# Patient Record
Sex: Male | Born: 1950
Health system: Southern US, Community
[De-identification: ages and names within clinical notes are randomized; demographics above are authoritative.]

## PROBLEM LIST (undated history)

## (undated) DIAGNOSIS — M1990 Unspecified osteoarthritis, unspecified site: Secondary | ICD-10-CM

## (undated) DIAGNOSIS — K219 Gastro-esophageal reflux disease without esophagitis: Secondary | ICD-10-CM

## (undated) DIAGNOSIS — I10 Essential (primary) hypertension: Secondary | ICD-10-CM

## (undated) DIAGNOSIS — E119 Type 2 diabetes mellitus without complications: Secondary | ICD-10-CM

## (undated) DIAGNOSIS — M19071 Primary osteoarthritis, right ankle and foot: Secondary | ICD-10-CM

## (undated) DIAGNOSIS — R351 Nocturia: Secondary | ICD-10-CM

## (undated) DIAGNOSIS — J189 Pneumonia, unspecified organism: Secondary | ICD-10-CM

## (undated) DIAGNOSIS — M2550 Pain in unspecified joint: Secondary | ICD-10-CM

## (undated) DIAGNOSIS — Z860100 Personal history of colon polyps, unspecified: Secondary | ICD-10-CM

## (undated) DIAGNOSIS — M19072 Primary osteoarthritis, left ankle and foot: Secondary | ICD-10-CM

## (undated) DIAGNOSIS — G2581 Restless legs syndrome: Secondary | ICD-10-CM

## (undated) DIAGNOSIS — M2540 Effusion, unspecified joint: Secondary | ICD-10-CM

## (undated) DIAGNOSIS — Z85828 Personal history of other malignant neoplasm of skin: Secondary | ICD-10-CM

## (undated) HISTORY — PX: HYDROCELE EXCISION / REPAIR: SUR1145

## (undated) HISTORY — PX: TONSILLECTOMY: SUR1361

## (undated) HISTORY — PX: COLONOSCOPY: SHX174

## (undated) HISTORY — PX: ESOPHAGOGASTRODUODENOSCOPY: SHX1529

---
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
## 1971-11-13 HISTORY — PX: HAND SURGERY: SHX662

## 1999-03-14 ENCOUNTER — Ambulatory Visit (HOSPITAL_BASED_OUTPATIENT_CLINIC_OR_DEPARTMENT_OTHER): Admission: RE | Admit: 1999-03-14 | Discharge: 1999-03-14 | Payer: Self-pay | Admitting: Orthopaedic Surgery

## 2009-06-17 ENCOUNTER — Encounter: Admission: RE | Admit: 2009-06-17 | Discharge: 2009-06-17 | Payer: Self-pay | Admitting: Neurosurgery

## 2010-12-04 ENCOUNTER — Encounter: Payer: Self-pay | Admitting: Neurosurgery

## 2011-11-13 HISTORY — PX: ROTATOR CUFF REPAIR: SHX139

## 2013-02-16 DIAGNOSIS — R0689 Other abnormalities of breathing: Secondary | ICD-10-CM

## 2013-02-16 DIAGNOSIS — R0600 Dyspnea, unspecified: Secondary | ICD-10-CM | POA: Insufficient documentation

## 2013-10-01 ENCOUNTER — Ambulatory Visit: Payer: Self-pay

## 2013-12-14 DIAGNOSIS — G2581 Restless legs syndrome: Secondary | ICD-10-CM | POA: Insufficient documentation

## 2015-07-26 ENCOUNTER — Ambulatory Visit: Payer: Self-pay | Admitting: Orthopedic Surgery

## 2015-08-02 NOTE — Patient Instructions (Addendum)
YOUR PROCEDURE IS SCHEDULED ON : 08/19/15  REPORT TO [facility] MAIN ENTRANCE FOLLOW SIGNS TO EAST ELEVATOR - GO TO 3rd FLOOR CHECK IN AT 3 EAST NURSES STATION (SHORT STAY) AT:  8:00 AM  CALL THIS NUMBER IF YOU HAVE PROBLEMS THE MORNING OF SURGERY [phone]  REMEMBER:ONLY 1 PER PERSON MAY GO TO SHORT STAY WITH YOU TO GET READY THE MORNING OF YOUR SURGERY  DO NOT EAT FOOD OR DRINK LIQUIDS AFTER MIDNIGHT  TAKE THESE MEDICINES THE MORNING OF SURGERY:  AMLODIPINE / GABAPENTIN / PROTONIX  YOU MAY NOT HAVE ANY METAL ON YOUR BODY INCLUDING HAIR PINS AND PIERCING'S. DO NOT WEAR JEWELRY, MAKEUP, LOTIONS, POWDERS OR PERFUMES. DO NOT WEAR NAIL POLISH. DO NOT SHAVE 48 HRS PRIOR TO SURGERY. MEN MAY SHAVE FACE AND NECK.  DO NOT [place]. [facility] IS NOT RESPONSIBLE FOR VALUABLES.  CONTACTS, DENTURES OR PARTIALS MAY NOT BE WORN TO SURGERY. LEAVE SUITCASE IN CAR. CAN BE BROUGHT TO ROOM AFTER SURGERY.  PATIENTS DISCHARGED THE DAY OF SURGERY WILL NOT BE ALLOWED TO DRIVE HOME.  PLEASE READ OVER THE FOLLOWING INSTRUCTION SHEETS _________________________________________________________________________________                                          [facility] - PREPARING FOR SURGERY  Before surgery, you can play an important role.  Because skin is not sterile, your skin needs to be as free of germs as possible.  You can reduce the number of germs on your skin by washing with CHG (chlorahexidine gluconate) soap before surgery.  CHG is an antiseptic cleaner which kills germs and bonds with the skin to continue killing germs even after washing. Please DO NOT use if you have an allergy to CHG or antibacterial soaps.  If your skin becomes reddened/irritated stop using the CHG and inform your nurse when you arrive at Short Stay. Do not shave (including legs and underarms) for at least 48 hours prior to the first CHG shower.  You may shave your face. Please follow  these instructions carefully:   1.  Shower with CHG Soap the night before surgery and the  morning of Surgery.   2.  If you choose to wash your hair, wash your hair first as usual with your  normal  Shampoo.   3.  After you shampoo, rinse your hair and body thoroughly to remove the  shampoo.                                         4.  Use CHG as you would any other liquid soap.  You can apply chg directly  to the skin and wash . Gently wash with scrungie or clean wascloth    5.  Apply the CHG Soap to your body ONLY FROM THE NECK DOWN.   Do not use on open                           Wound or open sores. Avoid contact with eyes, ears mouth and genitals (private parts).                        Genitals (private parts) with your  normal soap.              6.  Wash thoroughly, paying special attention to the area where your surgery  will be performed.   7.  Thoroughly rinse your body with warm water from the neck down.   8.  DO NOT shower/wash with your normal soap after using and rinsing off  the CHG Soap .                9.  Pat yourself dry with a clean towel.             10.  Wear clean night clothes to bed after shower             11.  Place clean sheets on your bed the night of your first shower and do not  sleep with pets.  Day of Surgery : Do not apply any lotions/deodorants the morning of surgery.  Please wear clean clothes to the hospital/surgery center.  FAILURE TO FOLLOW THESE INSTRUCTIONS MAY RESULT IN THE CANCELLATION OF YOUR SURGERY    PATIENT SIGNATURE_________________________________  ______________________________________________________________________     Samuel Hodge  An incentive spirometer is a tool that can help keep your lungs clear and active. This tool measures how well you are filling your lungs with each breath. Taking long deep breaths may help reverse or decrease the chance of developing breathing (pulmonary) problems (especially infection)  following:  A long period of time when you are unable to move or be active. BEFORE THE PROCEDURE   If the spirometer includes an indicator to show your best effort, your nurse or respiratory therapist will set it to a desired goal.  If possible, sit up straight or lean slightly forward. Try not to slouch.  Hold the incentive spirometer in an upright position. INSTRUCTIONS FOR USE   Sit on the edge of your bed if possible, or sit up as far as you can in bed or on a chair.  Hold the incentive spirometer in an upright position.  Breathe out normally.  Place the mouthpiece in your mouth and seal your lips tightly around it.  Breathe in slowly and as deeply as possible, raising the piston or the ball toward the top of the column.  Hold your breath for 3-5 seconds or for as long as possible. Allow the piston or ball to fall to the bottom of the column.  Remove the mouthpiece from your mouth and breathe out normally.  Rest for a few seconds and repeat Steps 1 through 7 at least 10 times every 1-2 hours when you are awake. Take your time and take a few normal breaths between deep breaths.  The spirometer may include an indicator to show your best effort. Use the indicator as a goal to work toward during each repetition.  After each set of 10 deep breaths, practice coughing to be sure your lungs are clear. If you have an incision (the cut made at the time of surgery), support your incision when coughing by placing a pillow or rolled up towels firmly against it. Once you are able to get out of bed, walk around indoors and cough well. You may stop using the incentive spirometer when instructed by your caregiver.  RISKS AND COMPLICATIONS  Take your time so you do not get dizzy or light-headed.  If you are in pain, you may need to take or ask for pain medication before doing incentive spirometry. It is harder to take a deep  breath if you are having pain. AFTER USE  Rest and breathe slowly  and easily.  It can be helpful to keep track of a log of your progress. Your caregiver can provide you with a simple table to help with this. If you are using the spirometer at home, follow these instructions: [place] IF:   You are having difficultly using the spirometer.  You have trouble using the spirometer as often as instructed.  Your pain medication is not giving enough relief while using the spirometer.  You develop fever of 100.5 F (38.1 C) or higher. SEEK IMMEDIATE MEDICAL CARE IF:   You cough up bloody sputum that had not been present before.  You develop fever of 102 F (38.9 C) or greater.  You develop worsening pain at or near the incision site. MAKE SURE YOU:   Understand these instructions.  Will watch your condition.  Will get help right away if you are not doing well or get worse. Document Released: 03/11/2007 Document Revised: 01/21/2012 Document Reviewed: 05/12/2007 Exit[employer] Patient Information 2014 [employer], [place].   ________________________________________________________________________  WHAT IS A BLOOD TRANSFUSION? Blood Transfusion Information  A transfusion is the replacement of blood or some of its parts. Blood is made up of multiple cells which provide different functions.  Red blood cells carry oxygen and are used for blood loss replacement.  White blood cells fight against infection.  Platelets control bleeding.  Plasma helps clot blood.  Other blood products are available for specialized needs, such as hemophilia or other clotting disorders. BEFORE THE TRANSFUSION  Who gives blood for transfusions?   Healthy volunteers who are fully evaluated to make sure their blood is safe. This is blood bank blood. Transfusion therapy is the safest it has ever been in the practice of medicine. Before blood is taken from a donor, a complete history is taken to make sure that person has no history of diseases nor engages in risky social  behavior (examples are intravenous drug use or sexual activity with multiple partners). The donor's travel history is screened to minimize risk of transmitting infections, such as malaria. The donated blood is tested for signs of infectious diseases, such as HIV and hepatitis. The blood is then tested to be sure it is compatible with you in order to minimize the chance of a transfusion reaction. If you or a relative donates blood, this is often done in anticipation of surgery and is not appropriate for emergency situations. It takes many days to process the donated blood. RISKS AND COMPLICATIONS Although transfusion therapy is very safe and saves many lives, the main dangers of transfusion include:   Getting an infectious disease.  Developing a transfusion reaction. This is an allergic reaction to something in the blood you were given. Every precaution is taken to prevent this. The decision to have a blood transfusion has been considered carefully by your caregiver before blood is given. Blood is not given unless the benefits outweigh the risks. AFTER THE TRANSFUSION  Right after receiving a blood transfusion, you will usually feel much better and more energetic. This is especially true if your red blood cells have gotten low (anemic). The transfusion raises the level of the red blood cells which carry oxygen, and this usually causes an energy increase.  The nurse administering the transfusion will monitor you carefully for complications. HOME CARE INSTRUCTIONS  No special instructions are needed after a transfusion. You may find your energy is better. Speak with your caregiver about any  limitations on activity for underlying diseases you may have. SEEK MEDICAL CARE IF:   Your condition is not improving after your transfusion.  You develop redness or irritation at the intravenous (IV) site. SEEK IMMEDIATE MEDICAL CARE IF:  Any of the following symptoms occur over the next 12 hours:  Shaking  chills.  You have a temperature by mouth above 102 F (38.9 C), not controlled by medicine.  Chest, back, or muscle pain.  People around you feel you are not acting correctly or are confused.  Shortness of breath or difficulty breathing.  Dizziness and fainting.  You get a rash or develop hives.  You have a decrease in urine output.  Your urine turns a dark color or changes to pink, red, or brown. Any of the following symptoms occur over the next 10 days:  You have a temperature by mouth above 102 F (38.9 C), not controlled by medicine.  Shortness of breath.  Weakness after normal activity.  The white part of the eye turns yellow (jaundice).  You have a decrease in the amount of urine or are urinating less often.  Your urine turns a dark color or changes to pink, red, or brown. Document Released: 10/26/2000 Document Revised: 01/21/2012 Document Reviewed: 06/14/2008 [facility] Patient Information 2014 [place], [place].  _______________________________________________________________________

## 2015-08-03 ENCOUNTER — Encounter (HOSPITAL_COMMUNITY)
Admission: RE | Admit: 2015-08-03 | Discharge: 2015-08-03 | Disposition: A | Discharge status: Home / Self Care | Payer: PPO | Source: Ambulatory Visit | Attending: Orthopedic Surgery | Admitting: Orthopedic Surgery

## 2015-08-03 ENCOUNTER — Ambulatory Visit: Payer: Self-pay | Admitting: Orthopedic Surgery

## 2015-08-03 ENCOUNTER — Encounter (HOSPITAL_COMMUNITY): Payer: Self-pay

## 2015-08-03 DIAGNOSIS — Z01818 Encounter for other preprocedural examination: Secondary | ICD-10-CM | POA: Insufficient documentation

## 2015-08-03 HISTORY — DX: Gastro-esophageal reflux disease without esophagitis: K21.9

## 2015-08-03 HISTORY — DX: Unspecified osteoarthritis, unspecified site: M19.90

## 2015-08-03 HISTORY — DX: Primary osteoarthritis, left ankle and foot: M19.072

## 2015-08-03 HISTORY — DX: Personal history of other malignant neoplasm of skin: Z85.828

## 2015-08-03 HISTORY — DX: Primary osteoarthritis, left ankle and foot: M19.071

## 2015-08-03 HISTORY — DX: Type 2 diabetes mellitus without complications: E11.9

## 2015-08-03 HISTORY — DX: Essential (primary) hypertension: I10

## 2015-08-03 LAB — COMPREHENSIVE METABOLIC PANEL
ALT: 37 U/L (ref 17–63)
AST: 33 U/L (ref 15–41)
Albumin: 4.3 g/dL (ref 3.5–5.0)
Alkaline Phosphatase: 78 U/L (ref 38–126)
Anion gap: 8 (ref 5–15)
BUN: 24 mg/dL — ABNORMAL HIGH (ref 6–20)
CO2: 25 mmol/L (ref 22–32)
Calcium: 9.3 mg/dL (ref 8.9–10.3)
Chloride: 101 mmol/L (ref 101–111)
Creatinine, Ser: 0.77 mg/dL (ref 0.61–1.24)
GFR calc Af Amer: >60 mL/min (ref >60)
GFR calc non Af Amer: >60 mL/min (ref >60)
Glucose, Bld: 103 mg/dL — ABNORMAL HIGH (ref 65–99)
Potassium: 3.9 mmol/L (ref 3.5–5.1)
Sodium: 134 mmol/L — ABNORMAL LOW (ref 135–145)
Total Bilirubin: 0.6 mg/dL (ref 0.3–1.2)
Total Protein: 7.2 g/dL (ref 6.5–8.1)

## 2015-08-03 LAB — URINALYSIS, ROUTINE W REFLEX MICROSCOPIC
Bilirubin Urine: NEGATIVE
Glucose, UA: 500 mg/dL — AB
Hgb urine dipstick: NEGATIVE
Ketones, ur: NEGATIVE mg/dL
Leukocytes, UA: NEGATIVE
Nitrite: NEGATIVE
Protein, ur: NEGATIVE mg/dL
Specific Gravity, Urine: 1.027 (ref 1.005–1.030)
Urobilinogen, UA: 0.2 mg/dL (ref 0.0–1.0)
pH: 5 (ref 5.0–8.0)

## 2015-08-03 LAB — CBC
HCT: 42 % (ref 39.0–52.0)
Hemoglobin: 14.8 g/dL (ref 13.0–17.0)
MCH: 30.4 pg (ref 26.0–34.0)
MCHC: 35.2 g/dL (ref 30.0–36.0)
MCV: 86.2 fL (ref 78.0–100.0)
Platelets: 187 10*3/uL (ref 150–400)
RBC: 4.87 MIL/uL (ref 4.22–5.81)
RDW: 12.9 % (ref 11.5–15.5)
WBC: 5.2 10*3/uL (ref 4.0–10.5)

## 2015-08-03 LAB — APTT: aPTT: 26 seconds (ref 24–37)

## 2015-08-03 LAB — PROTIME-INR
INR: 0.9 (ref 0.00–1.49)
Prothrombin Time: 12.3 seconds (ref 11.6–15.2)

## 2015-08-03 LAB — SURGICAL PCR SCREEN
MRSA, PCR: NEGATIVE
Staphylococcus aureus: NEGATIVE

## 2015-08-03 NOTE — H&P (Signed)
TOTAL HIP ADMISSION H&P  Patient is admitted for right total hip arthroplasty.  Subjective:  Chief Complaint: right hip pain  HPI: Samuel Hodge, 64 y.o. male, has a history of pain and functional disability in the right hip(s) due to arthritis and patient has failed non-surgical conservative treatments for greater than 12 weeks to include NSAID's and/or analgesics, corticosteriod injections, use of assistive devices, weight reduction as appropriate and activity modification.  Onset of symptoms was gradual starting 1 years ago with gradually worsening course since that time.The patient noted no past surgery on the right hip(s).  Patient currently rates pain in the right hip at 10 out of 10 with activity. Patient has night pain, worsening of pain with activity and weight bearing, pain that interfers with activities of daily living and pain with passive range of motion. Patient has evidence of subchondral cysts, subchondral sclerosis, periarticular osteophytes and joint space narrowing by imaging studies. This condition presents safety issues increasing the risk of falls. There is no current active infection.  There are no active problems to display for this patient.  Past Medical History  Diagnosis Date  . Hypertension   . Arthritis   . History of gout   . History of skin cancer   . GERD (gastroesophageal reflux disease)   . Diabetes mellitus without complication   . Restless leg syndrome   . Osteoarthritis of both ankles     WEARS BRACES BOTH ANKLES    Past Surgical History  Procedure Laterality Date  . Rotator cuff repair  2013    rt shoulder  . Hand surgery  1973    rt hand  . Hydrocele excision / repair  1986  . Fracture surgery  1968    left hip  . Tonsillectomy       (Not in a hospital admission) Allergies  Allergen Reactions  . Aleve [Naproxen Sodium] Hives  . Aspirin     unknown  . Penicillins     All pt remembers is nose bleeds associated with this reaction.  Has  patient had a PCN reaction causing immediate rash, facial/tongue/throat swelling, SOB or lightheadedness with hypotension: Unknown Has patient had a PCN reaction causing severe rash involving mucus membranes or skin necrosis: Unknown Has patient had a PCN reaction that required hospitalization: Yes Has patient had a PCN reaction occurring within the last 10 years: No If all of the above answers are "NO", then may proceed with Cepha    Social History  Substance Use Topics  . Smoking status: Never Smoker   . Smokeless tobacco: Not on file  . Alcohol Use: No    No family history on file.   Review of Systems  Constitutional: Negative.   HENT: Negative.   Eyes: Negative.   Respiratory: Negative.   Cardiovascular: Negative.   Gastrointestinal: Positive for heartburn.  Genitourinary: Negative.   Musculoskeletal: Positive for joint pain.  Skin: Negative.   Neurological: Negative.   Endo/Heme/Allergies: Negative.   Psychiatric/Behavioral: Negative.     Objective:  Physical Exam  Vitals reviewed. Constitutional: He is oriented to person, place, and time. He appears well-developed and well-nourished.  HENT:  Head: Normocephalic and atraumatic.  Eyes: Conjunctivae and EOM are normal. Pupils are equal, round, and reactive to light.  Neck: Normal range of motion. Neck supple.  Cardiovascular: Normal rate, regular rhythm, normal heart sounds and intact distal pulses.   Respiratory: Breath sounds normal. No respiratory distress.  GI: Soft. He exhibits no distension. There is no tenderness.  Genitourinary:  deferred  Musculoskeletal:       Right hip: He exhibits decreased range of motion and bony tenderness.  Neurological: He is alert and oriented to person, place, and time. He has normal reflexes.  Skin: Skin is warm.  Psychiatric: He has a normal mood and affect. His behavior is normal. Judgment and thought content normal.    Vital signs in last 24  hours: @VSRANGES @  Labs:  HgA1c 7.5   Estimated body mass index is 31.48 kg/(m^2) as calculated from the following:   Height as of an earlier encounter on 08/03/15: 5\' 8"  (1.727 m).   Weight as of an earlier encounter on 08/03/15: 93.895 kg (207 lb).   Imaging Review Plain radiographs demonstrate severe degenerative joint disease of the right hip(s). The bone quality appears to be adequate for age and reported activity level.  Assessment/Plan:  End stage arthritis, right hip(s)  The patient history, physical examination, clinical judgement of the provider and imaging studies are consistent with end stage degenerative joint disease of the right hip(s) and total hip arthroplasty is deemed medically necessary. The treatment options including medical management, injection therapy, arthroscopy and arthroplasty were discussed at length. The risks and benefits of total hip arthroplasty were presented and reviewed. The risks due to aseptic loosening, infection, stiffness, dislocation/subluxation,  thromboembolic complications and other imponderables were discussed.  The patient acknowledged the explanation, agreed to proceed with the plan and consent was signed. Patient is being admitted for inpatient treatment for surgery, pain control, PT, OT, prophylactic antibiotics, VTE prophylaxis, progressive ambulation and ADL's and discharge planning.The patient is planning to be discharged home with home health services

## 2015-08-03 NOTE — Progress Notes (Signed)
   08/03/15 0823  OBSTRUCTIVE SLEEP APNEA  Have you ever been diagnosed with sleep apnea through a sleep study? No  Do you snore loudly (loud enough to be heard through closed doors)?  1  Do you often feel tired, fatigued, or sleepy during the daytime (such as falling asleep during driving or talking to someone)? 0  Has anyone observed you stop breathing during your sleep? 1  Do you have, or are you being treated for high blood pressure? 1  BMI more than 35 kg/m2? 0  Age > 50 (1-yes) 1  Neck circumference greater than:Male 16 inches or larger, Male 17inches or larger? 1  Male Gender (Yes=1) 1  Obstructive Sleep Apnea Score 6

## 2015-08-19 ENCOUNTER — Inpatient Hospital Stay (HOSPITAL_COMMUNITY): Payer: PPO

## 2015-08-19 ENCOUNTER — Inpatient Hospital Stay (HOSPITAL_COMMUNITY)
Admission: RE | Admit: 2015-08-19 | Discharge: 2015-08-20 | DRG: 470 | Disposition: A | Discharge status: Home Health | Payer: PPO | Source: Ambulatory Visit | Attending: Orthopedic Surgery | Admitting: Orthopedic Surgery

## 2015-08-19 ENCOUNTER — Encounter (HOSPITAL_COMMUNITY)
Admission: RE | Disposition: A | Discharge status: Home Health | Payer: Self-pay | Source: Ambulatory Visit | Attending: Orthopedic Surgery

## 2015-08-19 ENCOUNTER — Inpatient Hospital Stay (HOSPITAL_COMMUNITY): Payer: PPO | Admitting: Anesthesiology

## 2015-08-19 ENCOUNTER — Encounter (HOSPITAL_COMMUNITY): Payer: Self-pay | Admitting: *Deleted

## 2015-08-19 DIAGNOSIS — M1611 Unilateral primary osteoarthritis, right hip: Secondary | ICD-10-CM | POA: Diagnosis present

## 2015-08-19 DIAGNOSIS — G2581 Restless legs syndrome: Secondary | ICD-10-CM | POA: Diagnosis present

## 2015-08-19 DIAGNOSIS — Z886 Allergy status to analgesic agent status: Secondary | ICD-10-CM

## 2015-08-19 DIAGNOSIS — M19071 Primary osteoarthritis, right ankle and foot: Secondary | ICD-10-CM | POA: Diagnosis present

## 2015-08-19 DIAGNOSIS — Z7984 Long term (current) use of oral hypoglycemic drugs: Secondary | ICD-10-CM

## 2015-08-19 DIAGNOSIS — Z09 Encounter for follow-up examination after completed treatment for conditions other than malignant neoplasm: Secondary | ICD-10-CM

## 2015-08-19 DIAGNOSIS — Z01812 Encounter for preprocedural laboratory examination: Secondary | ICD-10-CM

## 2015-08-19 DIAGNOSIS — E119 Type 2 diabetes mellitus without complications: Secondary | ICD-10-CM | POA: Diagnosis present

## 2015-08-19 DIAGNOSIS — I10 Essential (primary) hypertension: Secondary | ICD-10-CM | POA: Diagnosis present

## 2015-08-19 DIAGNOSIS — M25551 Pain in right hip: Secondary | ICD-10-CM | POA: Diagnosis present

## 2015-08-19 DIAGNOSIS — Z85828 Personal history of other malignant neoplasm of skin: Secondary | ICD-10-CM

## 2015-08-19 DIAGNOSIS — M19072 Primary osteoarthritis, left ankle and foot: Secondary | ICD-10-CM | POA: Diagnosis present

## 2015-08-19 DIAGNOSIS — K219 Gastro-esophageal reflux disease without esophagitis: Secondary | ICD-10-CM | POA: Diagnosis present

## 2015-08-19 DIAGNOSIS — Z79899 Other long term (current) drug therapy: Secondary | ICD-10-CM | POA: Diagnosis not present

## 2015-08-19 DIAGNOSIS — Z419 Encounter for procedure for purposes other than remedying health state, unspecified: Secondary | ICD-10-CM

## 2015-08-19 HISTORY — PX: TOTAL HIP ARTHROPLASTY: SHX124

## 2015-08-19 LAB — GLUCOSE, CAPILLARY
Glucose-Capillary: 131 mg/dL — ABNORMAL HIGH (ref 65–99)
Glucose-Capillary: 143 mg/dL — ABNORMAL HIGH (ref 65–99)
Glucose-Capillary: 208 mg/dL — ABNORMAL HIGH (ref 65–99)
Glucose-Capillary: 246 mg/dL — ABNORMAL HIGH (ref 65–99)

## 2015-08-19 LAB — ABO/RH: ABO/RH(D): O POS

## 2015-08-19 LAB — TYPE AND SCREEN
ABO/RH(D): O POS
Antibody Screen: NEGATIVE

## 2015-08-19 SURGERY — ARTHROPLASTY, HIP, TOTAL, ANTERIOR APPROACH
Anesthesia: Spinal | Site: Hip | Laterality: Right

## 2015-08-19 MED ORDER — CHLORHEXIDINE GLUCONATE 4 % EX LIQD: 60.0000 mL | Freq: Once | CUTANEOUS | Status: DC

## 2015-08-19 MED ORDER — SODIUM CHLORIDE 0.9 % IJ SOLN
INTRAMUSCULAR | Status: DC | PRN
  Administered 2015-08-19: 30 mL

## 2015-08-19 MED ORDER — CEFAZOLIN SODIUM-DEXTROSE 2-3 GM-% IV SOLR
2.0000 g | Freq: Four times a day (QID) | INTRAVENOUS | Status: AC
  Administered 2015-08-19 (×2): 2 g via INTRAVENOUS
  Filled 2015-08-19 (×2): qty 50

## 2015-08-19 MED ORDER — PANTOPRAZOLE SODIUM 40 MG PO TBEC
40.0000 mg | DELAYED_RELEASE_TABLET | Freq: Every day | ORAL | Status: DC
  Administered 2015-08-20: 40 mg via ORAL
  Filled 2015-08-19: qty 1

## 2015-08-19 MED ORDER — SENNA 8.6 MG PO TABS: 2.0000 | ORAL_TABLET | Freq: Every day | ORAL | Status: DC

## 2015-08-19 MED ORDER — APIXABAN 2.5 MG PO TABS
2.5000 mg | ORAL_TABLET | Freq: Two times a day (BID) | ORAL | Status: DC
Start: 2015-08-20 — End: 2015-08-20
  Administered 2015-08-20: 2.5 mg via ORAL
  Filled 2015-08-19 (×3): qty 1

## 2015-08-19 MED ORDER — PROPOFOL 10 MG/ML IV BOLUS
INTRAVENOUS | Status: AC
  Filled 2015-08-19: qty 20

## 2015-08-19 MED ORDER — ONDANSETRON HCL 4 MG PO TABS: 4.0000 mg | ORAL_TABLET | Freq: Four times a day (QID) | ORAL | Status: DC | PRN

## 2015-08-19 MED ORDER — AMLODIPINE BESYLATE 5 MG PO TABS
5.0000 mg | ORAL_TABLET | Freq: Every day | ORAL | Status: DC
  Administered 2015-08-20: 5 mg via ORAL
  Filled 2015-08-19: qty 1

## 2015-08-19 MED ORDER — METHOCARBAMOL 500 MG PO TABS
500.0000 mg | ORAL_TABLET | Freq: Four times a day (QID) | ORAL | Status: DC | PRN
  Administered 2015-08-20 (×2): 500 mg via ORAL
  Filled 2015-08-19 (×2): qty 1

## 2015-08-19 MED ORDER — MIDAZOLAM HCL 2 MG/2ML IJ SOLN
INTRAMUSCULAR | Status: AC
  Filled 2015-08-19: qty 4

## 2015-08-19 MED ORDER — CHLORHEXIDINE GLUCONATE 4 % EX LIQD
60.0000 mL | Freq: Once | CUTANEOUS | Status: DC
Start: 2015-08-19 — End: 2015-08-19

## 2015-08-19 MED ORDER — INSULIN ASPART 100 UNIT/ML ~~LOC~~ SOLN
0.0000 [IU] | Freq: Three times a day (TID) | SUBCUTANEOUS | Status: DC
  Administered 2015-08-19: 5 [IU] via SUBCUTANEOUS

## 2015-08-19 MED ORDER — SODIUM CHLORIDE 0.9 % IV SOLN: INTRAVENOUS | Status: DC

## 2015-08-19 MED ORDER — DEXTROSE 5 % IV SOLN
500.0000 mg | Freq: Four times a day (QID) | INTRAVENOUS | Status: DC | PRN
  Administered 2015-08-19: 500 mg via INTRAVENOUS
  Filled 2015-08-19 (×3): qty 5

## 2015-08-19 MED ORDER — HYDROGEN PEROXIDE 3 % EX SOLN
CUTANEOUS | Status: DC | PRN
  Administered 2015-08-19: 1

## 2015-08-19 MED ORDER — ACETAMINOPHEN 10 MG/ML IV SOLN
1000.0000 mg | Freq: Once | INTRAVENOUS | Status: AC
  Administered 2015-08-19: 1000 mg via INTRAVENOUS

## 2015-08-19 MED ORDER — FENTANYL CITRATE (PF) 100 MCG/2ML IJ SOLN: 25.0000 ug | INTRAMUSCULAR | Status: DC | PRN

## 2015-08-19 MED ORDER — ONDANSETRON HCL 4 MG/2ML IJ SOLN: 4.0000 mg | Freq: Four times a day (QID) | INTRAMUSCULAR | Status: DC | PRN

## 2015-08-19 MED ORDER — BUPIVACAINE-EPINEPHRINE (PF) 0.25% -1:200000 IJ SOLN
INTRAMUSCULAR | Status: AC
  Filled 2015-08-19: qty 30

## 2015-08-19 MED ORDER — BUPIVACAINE HCL (PF) 0.5 % IJ SOLN
INTRAMUSCULAR | Status: DC | PRN
  Administered 2015-08-19: 3 mL

## 2015-08-19 MED ORDER — LIDOCAINE HCL (CARDIAC) 20 MG/ML IV SOLN
INTRAVENOUS | Status: AC
  Filled 2015-08-19: qty 5

## 2015-08-19 MED ORDER — LISINOPRIL 20 MG PO TABS
30.0000 mg | ORAL_TABLET | Freq: Every day | ORAL | Status: DC
  Administered 2015-08-20: 30 mg via ORAL
  Filled 2015-08-19: qty 1

## 2015-08-19 MED ORDER — METOCLOPRAMIDE HCL 10 MG PO TABS: 5.0000 mg | ORAL_TABLET | Freq: Three times a day (TID) | ORAL | Status: DC | PRN

## 2015-08-19 MED ORDER — DOCUSATE SODIUM 100 MG PO CAPS
100.0000 mg | ORAL_CAPSULE | Freq: Two times a day (BID) | ORAL | Status: DC
  Administered 2015-08-19 – 2015-08-20 (×2): 100 mg via ORAL

## 2015-08-19 MED ORDER — DEXAMETHASONE SODIUM PHOSPHATE 10 MG/ML IJ SOLN
INTRAMUSCULAR | Status: DC | PRN
  Administered 2015-08-19: 10 mg via INTRAVENOUS

## 2015-08-19 MED ORDER — CEFAZOLIN SODIUM-DEXTROSE 2-3 GM-% IV SOLR
2.0000 g | INTRAVENOUS | Status: AC
  Administered 2015-08-19: 2 g via INTRAVENOUS

## 2015-08-19 MED ORDER — BUPIVACAINE HCL (PF) 0.5 % IJ SOLN
INTRAMUSCULAR | Status: AC
  Filled 2015-08-19: qty 30

## 2015-08-19 MED ORDER — MIDAZOLAM HCL 5 MG/5ML IJ SOLN
INTRAMUSCULAR | Status: DC | PRN
  Administered 2015-08-19: 2 mg via INTRAVENOUS

## 2015-08-19 MED ORDER — DEXAMETHASONE SODIUM PHOSPHATE 10 MG/ML IJ SOLN
INTRAMUSCULAR | Status: AC
  Filled 2015-08-19: qty 1

## 2015-08-19 MED ORDER — FENTANYL CITRATE (PF) 100 MCG/2ML IJ SOLN
INTRAMUSCULAR | Status: AC
  Filled 2015-08-19: qty 4

## 2015-08-19 MED ORDER — SODIUM CHLORIDE 0.9 % IV SOLN
INTRAVENOUS | Status: DC
  Administered 2015-08-19: 150 mL/h via INTRAVENOUS

## 2015-08-19 MED ORDER — ACETAMINOPHEN 10 MG/ML IV SOLN
INTRAVENOUS | Status: AC
  Filled 2015-08-19: qty 100

## 2015-08-19 MED ORDER — FENTANYL CITRATE (PF) 100 MCG/2ML IJ SOLN
INTRAMUSCULAR | Status: DC | PRN
Start: 2015-08-19 — End: 2015-08-19
  Administered 2015-08-19: 100 ug via INTRAVENOUS

## 2015-08-19 MED ORDER — GLIMEPIRIDE 1 MG PO TABS
1.0000 mg | ORAL_TABLET | Freq: Every day | ORAL | Status: DC
  Administered 2015-08-20: 1 mg via ORAL
  Filled 2015-08-19 (×2): qty 1

## 2015-08-19 MED ORDER — TRANEXAMIC ACID 1000 MG/10ML IV SOLN: 1000.0000 mg | INTRAVENOUS | Status: DC

## 2015-08-19 MED ORDER — ACETAMINOPHEN 325 MG PO TABS: 650.0000 mg | ORAL_TABLET | Freq: Four times a day (QID) | ORAL | Status: DC | PRN

## 2015-08-19 MED ORDER — BUPIVACAINE-EPINEPHRINE 0.25% -1:200000 IJ SOLN
INTRAMUSCULAR | Status: DC | PRN
  Administered 2015-08-19: 30 mL

## 2015-08-19 MED ORDER — CEFAZOLIN SODIUM-DEXTROSE 2-3 GM-% IV SOLR
INTRAVENOUS | Status: AC
  Filled 2015-08-19: qty 50

## 2015-08-19 MED ORDER — ROPINIROLE HCL ER 8 MG PO TB24
8.0000 mg | ORAL_TABLET | Freq: Every day | ORAL | Status: DC
  Administered 2015-08-19: 8 mg via ORAL
  Filled 2015-08-19 (×2): qty 1

## 2015-08-19 MED ORDER — HYDROCODONE-ACETAMINOPHEN 5-325 MG PO TABS
1.0000 | ORAL_TABLET | ORAL | Status: DC | PRN
  Administered 2015-08-19 – 2015-08-20 (×5): 2 via ORAL
  Filled 2015-08-19 (×5): qty 2

## 2015-08-19 MED ORDER — DEXAMETHASONE SODIUM PHOSPHATE 10 MG/ML IJ SOLN
10.0000 mg | Freq: Once | INTRAMUSCULAR | Status: AC
  Administered 2015-08-20: 10 mg via INTRAVENOUS
  Filled 2015-08-19: qty 1

## 2015-08-19 MED ORDER — HYDROGEN PEROXIDE 3 % EX SOLN
CUTANEOUS | Status: AC
  Filled 2015-08-19: qty 473

## 2015-08-19 MED ORDER — GABAPENTIN 300 MG PO CAPS
900.0000 mg | ORAL_CAPSULE | Freq: Two times a day (BID) | ORAL | Status: DC
  Administered 2015-08-19 – 2015-08-20 (×2): 900 mg via ORAL
  Filled 2015-08-19 (×3): qty 3

## 2015-08-19 MED ORDER — SODIUM CHLORIDE 0.9 % IR SOLN
Status: DC | PRN
  Administered 2015-08-19: 1000 mL
  Administered 2015-08-19: 500 mL
  Administered 2015-08-19: 3000 mL

## 2015-08-19 MED ORDER — PROPOFOL 500 MG/50ML IV EMUL
INTRAVENOUS | Status: DC | PRN
  Administered 2015-08-19: 125 ug/kg/min via INTRAVENOUS

## 2015-08-19 MED ORDER — ISOPROPYL ALCOHOL 70 % SOLN
Status: AC
  Filled 2015-08-19: qty 480

## 2015-08-19 MED ORDER — ISOPROPYL ALCOHOL 70 % SOLN
Status: DC | PRN
  Administered 2015-08-19: 1 via TOPICAL

## 2015-08-19 MED ORDER — ACETAMINOPHEN 650 MG RE SUPP: 650.0000 mg | Freq: Four times a day (QID) | RECTAL | Status: DC | PRN

## 2015-08-19 MED ORDER — MENTHOL 3 MG MT LOZG: 1.0000 | LOZENGE | OROMUCOSAL | Status: DC | PRN

## 2015-08-19 MED ORDER — ONDANSETRON HCL 4 MG/2ML IJ SOLN
INTRAMUSCULAR | Status: AC
  Filled 2015-08-19: qty 2

## 2015-08-19 MED ORDER — METFORMIN HCL ER 500 MG PO TB24
1000.0000 mg | ORAL_TABLET | Freq: Two times a day (BID) | ORAL | Status: DC
  Administered 2015-08-20: 1000 mg via ORAL
  Filled 2015-08-19 (×3): qty 2

## 2015-08-19 MED ORDER — PHENOL 1.4 % MT LIQD: 1.0000 | OROMUCOSAL | Status: DC | PRN

## 2015-08-19 MED ORDER — HYDROMORPHONE HCL 1 MG/ML IJ SOLN: 0.5000 mg | INTRAMUSCULAR | Status: DC | PRN

## 2015-08-19 MED ORDER — EPHEDRINE SULFATE 50 MG/ML IJ SOLN
INTRAMUSCULAR | Status: DC | PRN
  Administered 2015-08-19 (×2): 5 mg via INTRAVENOUS
  Administered 2015-08-19 (×4): 10 mg via INTRAVENOUS

## 2015-08-19 MED ORDER — ONDANSETRON HCL 4 MG/2ML IJ SOLN
INTRAMUSCULAR | Status: DC | PRN
  Administered 2015-08-19: 4 mg via INTRAVENOUS

## 2015-08-19 MED ORDER — SODIUM CHLORIDE 0.9 % IJ SOLN
INTRAMUSCULAR | Status: AC
  Filled 2015-08-19: qty 50

## 2015-08-19 MED ORDER — LIDOCAINE HCL (CARDIAC) 20 MG/ML IV SOLN
INTRAVENOUS | Status: DC | PRN
  Administered 2015-08-19: 50 mg via INTRAVENOUS

## 2015-08-19 MED ORDER — LACTATED RINGERS IV SOLN
INTRAVENOUS | Status: DC
  Administered 2015-08-19: 1000 mL via INTRAVENOUS
  Administered 2015-08-19 (×3): via INTRAVENOUS

## 2015-08-19 MED ORDER — WATER FOR IRRIGATION, STERILE IR SOLN
Status: DC | PRN
  Administered 2015-08-19: 1000 mL

## 2015-08-19 MED ORDER — TRANEXAMIC ACID 1000 MG/10ML IV SOLN
1000.0000 mg | INTRAVENOUS | Status: AC
  Administered 2015-08-19: 1000 mg via INTRAVENOUS
  Filled 2015-08-19: qty 10

## 2015-08-19 MED ORDER — ONDANSETRON HCL 4 MG/2ML IJ SOLN: 4.0000 mg | Freq: Once | INTRAMUSCULAR | Status: DC | PRN

## 2015-08-19 MED ORDER — METOCLOPRAMIDE HCL 5 MG/ML IJ SOLN: 5.0000 mg | Freq: Three times a day (TID) | INTRAMUSCULAR | Status: DC | PRN

## 2015-08-19 MED ORDER — HYDROCHLOROTHIAZIDE 25 MG PO TABS
25.0000 mg | ORAL_TABLET | Freq: Every day | ORAL | Status: DC
  Administered 2015-08-20: 25 mg via ORAL
  Filled 2015-08-19: qty 1

## 2015-08-19 SURGICAL SUPPLY — 49 items
BAG DECANTER FOR FLEXI CONT (MISCELLANEOUS) IMPLANT
BAG SPEC THK2 15X12 ZIP CLS (MISCELLANEOUS)
BAG ZIPLOCK 12X15 (MISCELLANEOUS) IMPLANT
CAPT HIP TOTAL 2 ×1 IMPLANT
CHLORAPREP W/TINT 26ML (MISCELLANEOUS) ×2 IMPLANT
COVER PERINEAL POST (MISCELLANEOUS) ×2 IMPLANT
DECANTER SPIKE VIAL GLASS SM (MISCELLANEOUS) ×2 IMPLANT
DRAPE C-ARM 42X120 X-RAY (DRAPES) ×2 IMPLANT
DRAPE LG THREE QUARTER DISP (DRAPES) ×4 IMPLANT
DRAPE STERI IOBAN 125X83 (DRAPES) ×2 IMPLANT
DRAPE U-SHAPE 47X51 STRL (DRAPES) ×6 IMPLANT
DRSG AQUACEL AG ADV 3.5X10 (GAUZE/BANDAGES/DRESSINGS) ×2 IMPLANT
ELECT BLADE TIP CTD 4 INCH (ELECTRODE) ×2 IMPLANT
ELECT PENCIL ROCKER SW 15FT (MISCELLANEOUS) ×2 IMPLANT
ELECT REM PT RETURN 15FT ADLT (MISCELLANEOUS) ×2 IMPLANT
FACESHIELD WRAPAROUND (MASK) ×4 IMPLANT
FACESHIELD WRAPAROUND OR TEAM (MASK) ×2 IMPLANT
GAUZE SPONGE 4X4 12PLY STRL (GAUZE/BANDAGES/DRESSINGS) ×1 IMPLANT
GLOVE BIO SURGEON STRL SZ8.5 (GLOVE) ×4 IMPLANT
GLOVE BIOGEL PI IND STRL 8.5 (GLOVE) ×1 IMPLANT
GLOVE BIOGEL PI INDICATOR 8.5 (GLOVE) ×1
GOWN SPEC L3 XXLG W/TWL (GOWN DISPOSABLE) ×2 IMPLANT
HANDPIECE INTERPULSE COAX TIP (DISPOSABLE) ×2
HOLDER FOLEY CATH W/STRAP (MISCELLANEOUS) ×2 IMPLANT
HOOD PEEL AWAY FACE SHEILD DIS (HOOD) ×4 IMPLANT
KIT BASIN OR (CUSTOM PROCEDURE TRAY) ×2 IMPLANT
LIQUID BAND (GAUZE/BANDAGES/DRESSINGS) ×3 IMPLANT
NDL SPNL 18GX3.5 QUINCKE PK (NEEDLE) ×1 IMPLANT
NEEDLE SPNL 18GX3.5 QUINCKE PK (NEEDLE) ×2 IMPLANT
PACK TOTAL JOINT (CUSTOM PROCEDURE TRAY) ×2 IMPLANT
PEN SKIN MARKING BROAD (MISCELLANEOUS) ×2 IMPLANT
SAW OSC TIP CART 19.5X105X1.3 (SAW) ×2 IMPLANT
SEALER BIPOLAR AQUA 6.0 (INSTRUMENTS) ×2 IMPLANT
SET HNDPC FAN SPRY TIP SCT (DISPOSABLE) ×1 IMPLANT
SOL PREP POV-IOD 4OZ 10% (MISCELLANEOUS) ×2 IMPLANT
SUT ETHIBOND NAB CT1 #1 30IN (SUTURE) ×4 IMPLANT
SUT MNCRL AB 3-0 PS2 18 (SUTURE) ×2 IMPLANT
SUT MON AB 2-0 CT1 36 (SUTURE) ×4 IMPLANT
SUT VIC AB 1 CT1 36 (SUTURE) ×2 IMPLANT
SUT VIC AB 2-0 CT1 27 (SUTURE) ×2
SUT VIC AB 2-0 CT1 TAPERPNT 27 (SUTURE) ×1 IMPLANT
SUT VLOC 180 0 24IN GS25 (SUTURE) ×2 IMPLANT
SYR 50ML LL SCALE MARK (SYRINGE) ×2 IMPLANT
TOWEL OR 17X26 10 PK STRL BLUE (TOWEL DISPOSABLE) ×2 IMPLANT
TOWEL OR NON WOVEN STRL DISP B (DISPOSABLE) ×2 IMPLANT
TRAY FOLEY W/METER SILVER 14FR (SET/KITS/TRAYS/PACK) ×1 IMPLANT
TRAY FOLEY W/METER SILVER 16FR (SET/KITS/TRAYS/PACK) ×2 IMPLANT
WATER STERILE IRR 1500ML POUR (IV SOLUTION) ×2 IMPLANT
YANKAUER SUCT BULB TIP 10FT TU (MISCELLANEOUS) ×2 IMPLANT

## 2015-08-19 NOTE — Anesthesia Postprocedure Evaluation (Signed)
  Anesthesia Post-op Note  Patient: Samuel Hodge  Procedure(s) Performed: Procedure(s) (LRB): RIGHT TOTAL HIP ARTHROPLASTY ANTERIOR APPROACH (Right)  Patient Location: PACU  Anesthesia Type: Spinal  Level of Consciousness: awake and alert   Airway and Oxygen Therapy: Patient Spontanous Breathing  Post-op Pain: mild  Post-op Assessment: Post-op Vital signs reviewed, Patient's Cardiovascular Status Stable, Respiratory Function Stable, Patent Airway and No signs of Nausea or vomiting  Last Vitals:  Filed Vitals:   08/19/15 1518  BP: 120/69  Pulse: 83  Temp: 36.4 C  Resp: 16    Post-op Vital Signs: stable   Complications: No apparent anesthesia complications

## 2015-08-19 NOTE — H&P (View-Only) (Signed)
TOTAL HIP ADMISSION H&P  Patient is admitted for right total hip arthroplasty.  Subjective:  Chief Complaint: right hip pain  HPI: Samuel Hodge, 64 y.o. male, has a history of pain and functional disability in the right hip(s) due to arthritis and patient has failed non-surgical conservative treatments for greater than 12 weeks to include NSAID's and/or analgesics, corticosteriod injections, use of assistive devices, weight reduction as appropriate and activity modification.  Onset of symptoms was gradual starting 1 years ago with gradually worsening course since that time.The patient noted no past surgery on the right hip(s).  Patient currently rates pain in the right hip at 10 out of 10 with activity. Patient has night pain, worsening of pain with activity and weight bearing, pain that interfers with activities of daily living and pain with passive range of motion. Patient has evidence of subchondral cysts, subchondral sclerosis, periarticular osteophytes and joint space narrowing by imaging studies. This condition presents safety issues increasing the risk of falls. There is no current active infection.  There are no active problems to display for this patient.  Past Medical History  Diagnosis Date  . Hypertension   . Arthritis   . History of gout   . History of skin cancer   . GERD (gastroesophageal reflux disease)   . Diabetes mellitus without complication   . Restless leg syndrome   . Osteoarthritis of both ankles     WEARS BRACES BOTH ANKLES    Past Surgical History  Procedure Laterality Date  . Rotator cuff repair  2013    rt shoulder  . Hand surgery  1973    rt hand  . Hydrocele excision / repair  1986  . Fracture surgery  1968    left hip  . Tonsillectomy       (Not in a hospital admission) Allergies  Allergen Reactions  . Aleve [Naproxen Sodium] Hives  . Aspirin     unknown  . Penicillins     All pt remembers is nose bleeds associated with this reaction.  Has  patient had a PCN reaction causing immediate rash, facial/tongue/throat swelling, SOB or lightheadedness with hypotension: Unknown Has patient had a PCN reaction causing severe rash involving mucus membranes or skin necrosis: Unknown Has patient had a PCN reaction that required hospitalization: Yes Has patient had a PCN reaction occurring within the last 10 years: No If all of the above answers are "NO", then may proceed with Cepha    Social History  Substance Use Topics  . Smoking status: Never Smoker   . Smokeless tobacco: Not on file  . Alcohol Use: No    No family history on file.   Review of Systems  Constitutional: Negative.   HENT: Negative.   Eyes: Negative.   Respiratory: Negative.   Cardiovascular: Negative.   Gastrointestinal: Positive for heartburn.  Genitourinary: Negative.   Musculoskeletal: Positive for joint pain.  Skin: Negative.   Neurological: Negative.   Endo/Heme/Allergies: Negative.   Psychiatric/Behavioral: Negative.     Objective:  Physical Exam  Vitals reviewed. Constitutional: He is oriented to person, place, and time. He appears well-developed and well-nourished.  HENT:  Head: Normocephalic and atraumatic.  Eyes: Conjunctivae and EOM are normal. Pupils are equal, round, and reactive to light.  Neck: Normal range of motion. Neck supple.  Cardiovascular: Normal rate, regular rhythm, normal heart sounds and intact distal pulses.   Respiratory: Breath sounds normal. No respiratory distress.  GI: Soft. He exhibits no distension. There is no tenderness.  Genitourinary:  deferred  Musculoskeletal:       Right hip: He exhibits decreased range of motion and bony tenderness.  Neurological: He is alert and oriented to person, place, and time. He has normal reflexes.  Skin: Skin is warm.  Psychiatric: He has a normal mood and affect. His behavior is normal. Judgment and thought content normal.    Vital signs in last 24  hours: @VSRANGES @  Labs:  HgA1c 7.5   Estimated body mass index is 31.48 kg/(m^2) as calculated from the following:   Height as of an earlier encounter on 08/03/15: 5\' 8"  (1.727 m).   Weight as of an earlier encounter on 08/03/15: 93.895 kg (207 lb).   Imaging Review Plain radiographs demonstrate severe degenerative joint disease of the right hip(s). The bone quality appears to be adequate for age and reported activity level.  Assessment/Plan:  End stage arthritis, right hip(s)  The patient history, physical examination, clinical judgement of the provider and imaging studies are consistent with end stage degenerative joint disease of the right hip(s) and total hip arthroplasty is deemed medically necessary. The treatment options including medical management, injection therapy, arthroscopy and arthroplasty were discussed at length. The risks and benefits of total hip arthroplasty were presented and reviewed. The risks due to aseptic loosening, infection, stiffness, dislocation/subluxation,  thromboembolic complications and other imponderables were discussed.  The patient acknowledged the explanation, agreed to proceed with the plan and consent was signed. Patient is being admitted for inpatient treatment for surgery, pain control, PT, OT, prophylactic antibiotics, VTE prophylaxis, progressive ambulation and ADL's and discharge planning.The patient is planning to be discharged home with home health services

## 2015-08-19 NOTE — Op Note (Signed)
OPERATIVE REPORT  SURGEON: Rod Can, MD   ASSISTANT: Roberto Scales, RNFA.  PREOPERATIVE DIAGNOSIS: Right hip arthritis.   POSTOPERATIVE DIAGNOSIS: Right hip arthritis.   PROCEDURE: Right total hip arthroplasty, anterior approach.   IMPLANTS: DePuy Tri Lock stem, size 5, hi offset. DePuy Pinnacle Cup, size 52 mm. DePuy Altrx liner, size 52 by 32 mm, +4 neutral. DePuy Biolox ceramic head ball, size 32 + 1 mm.  ANESTHESIA:  Spinal  ESTIMATED BLOOD LOSS: 400 mL.   ANTIBIOTICS: 2g ancef.  DRAINS: None.  COMPLICATIONS: None.   CONDITION: PACU - hemodynamically stable.Marland Kitchen   BRIEF CLINICAL NOTE: Samuel Hodge is a 64 y.o. male with a long-standing history of Right hip arthritis. After failing conservative management, the patient was indicated for total hip arthroplasty. The risks, benefits, and alternatives to the procedure were explained, and the patient elected to proceed.  PROCEDURE IN DETAIL: Surgical site was marked by myself. Spinal anesthesia was obtained in the pre-op holding area. Once inside the operative room, a foley catheter was inserted. The patient was then positioned on the Hana table. All bony prominences were well padded. The hip was prepped and draped in the normal sterile surgical fashion. A time-out was called verifying side and site of surgery. The patient received IV antibiotics within 60 minutes of beginning the procedure.  The direct anterior approach to the hip was performed through the Hueter interval. Lateral femoral circumflex vessels were treated with the Auqumantys. The anterior capsule was exposed and an inverted T capsulotomy was made.The femoral neck cut was made to the level of the templated cut. A corkscrew was placed into the head and the head was removed. The femoral head was found to have eburnated bone. The head was passed to the back table and was measured.  Acetabular exposure was achieved, and the pulvinar and labrum  were excised. Sequental reaming of the acetabulum was then performed up to a size 51 mm reamer. A 52 mm cup was then opened and impacted into place at approximately 40 degrees of abduction and 20 degrees of anteversion. The final polyethylene liner was impacted into place and acetabular osteophytes were removed.   I then gained femoral exposure taking care to protect the abductors and greater trochanter. This was performed using standard external rotation, extension, and adduction. The capsule was peeled off the inner aspect of the greater trochanter, taking care to preserve the short external rotators. A cookie cutter was used to enter the femoral canal, and then the femoral canal finder was placed. Sequential broaching was performed up to a size 5. Calcar planer was used on the femoral neck remnant. I paced a hi offset neck and a trial head ball. The hip was reduced. Leg lengths and offset were checked fluoroscopically. The hip was dislocated and trial components were removed. The final implants were placed, and the hip was reduced.  Fluoroscopy was used to confirm component position and leg lengths. At 90 degrees of external rotation and full extension, the hip was stable to an anterior directed force.  The wound was copiously irrigated with a dilute betadine solution followed by normal saline. Marcaine solution was injected into the periarticular soft tissue. The wound was closed in layers using #1 Vicryl and V-Loc for the fascia, 2-0 Vicryl for the subcutaneous fat, 2-0 Monocryl for the deep dermal layer, 3-0 running Monocryl subcuticular stitch, and Dermabond for the skin. Once the glue was fully dried, an Aquacell Ag dressing was applied. The patient was transported to the  recovery room in stable condition. Sponge, needle, and instrument counts were correct at the end of the case x2. The patient tolerated the procedure well and there were no known complications.

## 2015-08-19 NOTE — Anesthesia Preprocedure Evaluation (Addendum)
Anesthesia Evaluation  Patient identified by MRN, date of birth, ID band Patient awake    Reviewed: Allergy & Precautions, NPO status , Patient's Chart, lab work & pertinent test results  History of Anesthesia Complications Negative for: history of anesthetic complications  Airway Mallampati: III  TM Distance: >3 FB Neck ROM: Full  Mouth opening: Limited Mouth Opening  Dental no notable dental hx. (+) Dental Advisory Given   Pulmonary neg pulmonary ROS,    Pulmonary exam normal breath sounds clear to auscultation       Cardiovascular hypertension, Pt. on medications Normal cardiovascular exam Rhythm:Regular Rate:Normal     Neuro/Psych negative neurological ROS  negative psych ROS   GI/Hepatic negative GI ROS, Neg liver ROS, GERD  Medicated and Controlled,  Endo/Other  diabetes, Type 2, Oral Hypoglycemic Agents  Renal/GU negative Renal ROS  negative genitourinary   Musculoskeletal  (+) Arthritis ,   Abdominal   Peds negative pediatric ROS (+)  Hematology negative hematology ROS (+)   Anesthesia Other Findings   Reproductive/Obstetrics negative OB ROS                            Anesthesia Physical Anesthesia Plan  ASA: II  Anesthesia Plan: Spinal   Post-op Pain Management:    Induction: Intravenous  Airway Management Planned: Nasal Cannula  Additional Equipment:   Intra-op Plan:   Post-operative Plan:   Informed Consent: I have reviewed the patients History and Physical, chart, labs and discussed the procedure including the risks, benefits and alternatives for the proposed anesthesia with the patient or authorized representative who has indicated his/her understanding and acceptance.   Dental advisory given  Plan Discussed with:   Anesthesia Plan Comments:        Anesthesia Quick Evaluation

## 2015-08-19 NOTE — Transfer of Care (Signed)
Immediate Anesthesia Transfer of Care Note  Patient: Samuel Hodge  Procedure(s) Performed: Procedure(s): RIGHT TOTAL HIP ARTHROPLASTY ANTERIOR APPROACH (Right)  Patient Location: PACU  Anesthesia Type:Spinal  Level of Consciousness: awake, alert  and oriented  Airway & Oxygen Therapy: Patient Spontanous Breathing and Patient connected to face mask oxygen  Post-op Assessment: Report given to RN and Post -op Vital signs reviewed and stable  Post vital signs: Reviewed and stable  Last Vitals:  Filed Vitals:   08/19/15 1406  BP: 99/52  Pulse:   Temp:   Resp:     Complications: No apparent anesthesia complications

## 2015-08-19 NOTE — Anesthesia Procedure Notes (Signed)
Spinal Patient location during procedure: OR Staffing Anesthesiologist: Lauretta Grill Resident/CRNA: Noralyn Pick D Performed by: anesthesiologist and resident/CRNA  Preanesthetic Checklist Completed: patient identified, site marked, surgical consent, pre-op evaluation, timeout performed, IV checked, risks and benefits discussed and monitors and equipment checked Spinal Block Patient position: sitting Prep: Betadine Patient monitoring: heart rate, continuous pulse ox and blood pressure Approach: right paramedian Location: L3-4 Injection technique: single-shot Needle Needle type: Spinocan  Needle gauge: 22 G Needle length: 9 cm Additional Notes Expiration date of kit checked and confirmed. Patient tolerated procedure well, without complications.  First several attempts by CRNA Peggy and os encountered with no CSF return. Dr. Jillyn Hidden made several passes as well and CSF return with R paramedian approach. No discomfort by patient, paresthesias, and clear CSF return achieved.

## 2015-08-19 NOTE — Interval H&P Note (Signed)
History and Physical Interval Note:  08/19/2015 9:42 AM  Samuel Hodge  has presented today for surgery, with the diagnosis of RIGHT HIP OA   The various methods of treatment have been discussed with the patient and family. After consideration of risks, benefits and other options for treatment, the patient has consented to  Procedure(s): RIGHT TOTAL HIP ARTHROPLASTY ANTERIOR APPROACH (Right) as a surgical intervention .  The patient's history has been reviewed, patient examined, no change in status, stable for surgery.  I have reviewed the patient's chart and labs.  Questions were answered to the patient's satisfaction.     Shaley Leavens, Horald Pollen

## 2015-08-19 NOTE — Evaluation (Signed)
Physical Therapy Evaluation Patient Details Name: Samuel Hodge MRN: [id number] DOB: [birth year] Today's Date: 08/19/2015   History of Present Illness  R DATHA  Clinical Impression  Patient tolerated ambulation very well today, x 75'. Patient will benefit from PT to address problems listed in the note below.    Follow Up Recommendations Home health PT    Equipment Recommendations  Rolling walker with 5" wheels;3in1 (PT)    Recommendations for Other Services       Precautions / Restrictions Precautions Precautions: Fall Required Braces or Orthoses: Other Brace/Splint Other Brace/Splint: bilateral ankle braces Restrictions Weight Bearing Restrictions: No      Mobility  Bed Mobility Overal bed mobility: Needs Assistance Bed Mobility: Supine to Sit     Supine to sit: HOB elevated;Supervision     General bed mobility comments: cues for technique  Transfers Overall transfer level: Needs assistance Equipment used: Rolling walker (2 wheeled) Transfers: Sit to/from Stand Sit to Stand: Min assist         General transfer comment: cues for haND PLACEMENT  Ambulation/Gait Ambulation/Gait assistance: Min assist Ambulation Distance (Feet): 75 Feet Assistive device: Rolling walker (2 wheeled) Gait Pattern/deviations: Step-to pattern     General Gait Details: cues for sequence  Stairs            Wheelchair Mobility    Modified Rankin (Stroke Patients Only)       Balance                                             Pertinent Vitals/Pain Pain Assessment: 0-10 Pain Score: 2  Pain Location: R thigh Pain Descriptors / Indicators: Tender;Tightness Pain Intervention(s): Repositioned;Premedicated before session    Home Living Family/patient expects to be discharged to:: Private residence Living Arrangements: Spouse/significant other Available Help at Discharge: Family Type of Home: House Home Access: Stairs to enter Entrance  Stairs-Rails: Psychiatric nurse of Steps: 3 Home Layout: One level Home Equipment: None      Prior Function Level of Independence: Independent               Hand Dominance        Extremity/Trunk Assessment   Upper Extremity Assessment: Defer to OT evaluation           Lower Extremity Assessment: RLE deficits/detail RLE Deficits / Details: advances the leg well    Cervical / Trunk Assessment: Normal  Communication   Communication: No difficulties  Cognition Arousal/Alertness: Awake/alert Behavior During Therapy: WFL for tasks assessed/performed Overall Cognitive Status: Within Functional Limits for tasks assessed                      General Comments      Exercises        Assessment/Plan    PT Assessment Patient needs continued PT services  PT Diagnosis Difficulty walking   PT Problem List Decreased strength;Decreased range of motion;Decreased activity tolerance;Decreased mobility;Decreased knowledge of use of DME;Decreased safety awareness;Decreased knowledge of precautions;Pain  PT Treatment Interventions DME instruction;Gait training;Stair training;Functional mobility training;Therapeutic activities;Therapeutic exercise;Patient/family education   PT Goals (Current goals can be found in the Care Plan section) Acute Rehab PT Goals Patient Stated Goal: to go home tomorrow PT Goal Formulation: With patient/family Time For Goal Achievement: 08/21/15 Potential to Achieve Goals: Good    Frequency 7X/week   Barriers to discharge  Co-evaluation               End of Session   Activity Tolerance: Patient tolerated treatment well Patient left: in chair;with call bell/phone within reach Nurse Communication: Mobility status         Time: 1505-6979 PT Time Calculation (min) (ACUTE ONLY): 29 min   Charges:   PT Evaluation $Initial PT Evaluation Tier I: 1 Procedure PT Treatments $Gait Training: 8-22 mins   PT  G Codes:        Claretha Cooper 08/19/2015, 5:48 PM

## 2015-08-20 LAB — BASIC METABOLIC PANEL
Anion gap: 8 (ref 5–15)
BUN: 13 mg/dL (ref 6–20)
CO2: 26 mmol/L (ref 22–32)
Calcium: 8.4 mg/dL — ABNORMAL LOW (ref 8.9–10.3)
Chloride: 102 mmol/L (ref 101–111)
Creatinine, Ser: 0.72 mg/dL (ref 0.61–1.24)
GFR calc Af Amer: >60 mL/min (ref >60)
GFR calc non Af Amer: >60 mL/min (ref >60)
Glucose, Bld: 170 mg/dL — ABNORMAL HIGH (ref 65–99)
Potassium: 4.3 mmol/L (ref 3.5–5.1)
Sodium: 136 mmol/L (ref 135–145)

## 2015-08-20 LAB — CBC
HCT: 34.5 % — ABNORMAL LOW (ref 39.0–52.0)
Hemoglobin: 12.5 g/dL — ABNORMAL LOW (ref 13.0–17.0)
MCH: 30.9 pg (ref 26.0–34.0)
MCHC: 36.2 g/dL — ABNORMAL HIGH (ref 30.0–36.0)
MCV: 85.2 fL (ref 78.0–100.0)
Platelets: 172 10*3/uL (ref 150–400)
RBC: 4.05 MIL/uL — ABNORMAL LOW (ref 4.22–5.81)
RDW: 12.7 % (ref 11.5–15.5)
WBC: 11 10*3/uL — ABNORMAL HIGH (ref 4.0–10.5)

## 2015-08-20 LAB — GLUCOSE, CAPILLARY: Glucose-Capillary: 140 mg/dL — ABNORMAL HIGH (ref 65–99)

## 2015-08-20 MED ORDER — APIXABAN 2.5 MG PO TABS: 2.5000 mg | ORAL_TABLET | Freq: Two times a day (BID) | ORAL | Status: DC

## 2015-08-20 MED ORDER — DOCUSATE SODIUM 100 MG PO CAPS: 100.0000 mg | ORAL_CAPSULE | Freq: Two times a day (BID) | ORAL | Status: DC

## 2015-08-20 MED ORDER — ONDANSETRON HCL 4 MG PO TABS: 4.0000 mg | ORAL_TABLET | Freq: Four times a day (QID) | ORAL | Status: DC | PRN

## 2015-08-20 MED ORDER — HYDROCODONE-ACETAMINOPHEN 5-325 MG PO TABS: 1.0000 | ORAL_TABLET | ORAL | Status: DC | PRN

## 2015-08-20 MED ORDER — SENNA 8.6 MG PO TABS: 2.0000 | ORAL_TABLET | Freq: Every day | ORAL | Status: DC

## 2015-08-20 NOTE — Progress Notes (Signed)
Physical Therapy Treatment Patient Details Name: Samuel Hodge MRN: [id number] DOB: 05/25/1951 Today's Date: 08/20/2015    History of Present Illness R DATHA    PT Comments    Patient is progressing well. Ready for DC.  Follow Up Recommendations  Home health PT     Equipment Recommendations  Rolling walker with 5" wheels;3in1 (PT)    Recommendations for Other Services       Precautions / Restrictions Precautions Precautions: Fall Required Braces or Orthoses: Other Brace/Splint Other Brace/Splint: bilateral ankle braces Restrictions Weight Bearing Restrictions: Yes Other Position/Activity Restrictions: WBAT    Mobility  Bed Mobility               General bed mobility comments: Pt found seated EOB upon OT entering/exiting room  Transfers Overall transfer level: Needs assistance Equipment used: Rolling walker (2 wheeled) Transfers: Sit to/from Stand Sit to Stand: Supervision         General transfer comment: Min guard for safety, cues for hand placement and technique.   Ambulation/Gait Ambulation/Gait assistance: Supervision Ambulation Distance (Feet): 300 Feet Assistive device: Rolling walker (2 wheeled) Gait Pattern/deviations: Step-to pattern     General Gait Details: cues for sequence   Stairs Stairs: Yes Stairs assistance: Min assist Stair Management: One rail Right;Step to pattern;Forwards;With cane Number of Stairs: 4 General stair comments: wife present for instruction.  Wheelchair Mobility    Modified Rankin (Stroke Patients Only)       Balance Overall balance assessment: Needs assistance Sitting-balance support: Feet supported;No upper extremity supported Sitting balance-Leahy Scale: Good     Standing balance support: Bilateral upper extremity supported;During functional activity Standing balance-Leahy Scale: Fair                      Cognition Arousal/Alertness: Awake/alert Behavior During Therapy: WFL for tasks  assessed/performed Overall Cognitive Status: Within Functional Limits for tasks assessed                      Exercises Total Joint Exercises Ankle Circles/Pumps: AROM;Right;Left;10 reps Quad Sets: 10 reps Short Arc Quad: AROM;Right;10 reps Heel Slides: AROM;Right;10 reps Hip ABduction/ADduction: AROM;Right;10 reps Long Arc Quad: AROM;Right;10 reps    General Comments        Pertinent Vitals/Pain Pain Assessment: Faces Pain Score: 4  Faces Pain Scale: Hurts little more Pain Location: thigh on right Pain Descriptors / Indicators: Tightness Pain Intervention(s): Premedicated before session;Ice applied    Home Living Family/patient expects to be discharged to:: Private residence Living Arrangements: Spouse/significant other Available Help at Discharge: Family Type of Home: House Home Access: Stairs to enter Entrance Stairs-Rails: Right;Left Home Layout: One level Home Equipment: None      Prior Function Level of Independence: Independent          PT Goals (current goals can now be found in the care plan section) Acute Rehab PT Goals Patient Stated Goal: to go home today Progress towards PT goals: Progressing toward goals    Frequency  7X/week    PT Plan Current plan remains appropriate    Co-evaluation             End of Session   Activity Tolerance: Patient tolerated treatment well Patient left: in chair;with family/visitor present     Time: 3536-1443 PT Time Calculation (min) (ACUTE ONLY): 22 min  Charges:  $Gait Training: 8-22 mins $Therapeutic Exercise: 23-37 mins  G Codes:      Claretha Cooper 08/20/2015, 1:12 PM

## 2015-08-20 NOTE — Discharge Summary (Signed)
Physician Discharge Summary  Patient ID: Samuel Hodge MRN: [id number] DOB/AGE: [birth year] 64 y.o.  Admit date: 08/19/2015 Discharge date: 08/20/2015  Admission Diagnoses:  Primary osteoarthritis of right hip  Discharge Diagnoses:  Principal Problem:   Primary osteoarthritis of right hip   Past Medical History  Diagnosis Date  . Hypertension   . Arthritis   . History of gout   . History of skin cancer   . GERD (gastroesophageal reflux disease)   . Diabetes mellitus without complication ([place])   . Restless leg syndrome   . Osteoarthritis of both ankles     WEARS BRACES BOTH ANKLES    Surgeries: Procedure(s): RIGHT TOTAL HIP ARTHROPLASTY ANTERIOR APPROACH on 08/19/2015   Consultants (if any):    Discharged Condition: Improved  Hospital Course: Samuel Hodge is an 64 y.o. male who was admitted 08/19/2015 with a diagnosis of Primary osteoarthritis of right hip and went to the operating room on 08/19/2015 and underwent the above named procedures.    He was given perioperative antibiotics:      Anti-infectives    Start     Dose/Rate Route Frequency Ordered Stop   08/19/15 1700  ceFAZolin (ANCEF) IVPB 2 g/50 mL premix     2 g 100 mL/hr over 30 Minutes Intravenous Every 6 hours 08/19/15 1528 08/19/15 2246   08/19/15 0752  ceFAZolin (ANCEF) IVPB 2 g/50 mL premix     2 g 100 mL/hr over 30 Minutes Intravenous On call to O.R. 08/19/15 1275 08/19/15 1042    .  He was given sequential compression devices, early ambulation, and apixiban for DVT prophylaxis.  He benefited maximally from the hospital stay and there were no complications.    Recent vital signs:  Filed Vitals:   08/20/15 0444  BP: 127/73  Pulse: 73  Temp: 97 F (36.1 C)  Resp: 16    Recent laboratory studies:  Lab Results  Component Value Date   HGB 12.5* 08/20/2015   HGB 14.8 08/03/2015   Lab Results  Component Value Date   WBC 11.0* 08/20/2015   PLT 172 08/20/2015   Lab Results  Component  Value Date   INR 0.90 08/03/2015   Lab Results  Component Value Date   NA 136 08/20/2015   K 4.3 08/20/2015   CL 102 08/20/2015   CO2 26 08/20/2015   BUN 13 08/20/2015   CREATININE 0.72 08/20/2015   GLUCOSE 170* 08/20/2015    Discharge Medications:     Medication List    STOP taking these medications        acetaminophen 650 MG CR tablet  Commonly known as:  TYLENOL     meloxicam 15 MG tablet  Commonly known as:  MOBIC      TAKE these medications        amLODipine 5 MG tablet  Commonly known as:  NORVASC  Take 5 mg by mouth daily.     apixaban 2.5 MG Tabs tablet  Commonly known as:  ELIQUIS  Take 1 tablet (2.5 mg total) by mouth 2 (two) times daily.     cyclobenzaprine 10 MG tablet  Commonly known as:  FLEXERIL  Take 10 mg by mouth 3 times/day as needed-between meals & bedtime for muscle spasms.     docusate sodium 100 MG capsule  Commonly known as:  COLACE  Take 1 capsule (100 mg total) by mouth 2 (two) times daily.     gabapentin 600 MG tablet  Commonly known as:  NEURONTIN  Take 900 mg by mouth 2 (two) times daily.     glimepiride 1 MG tablet  Commonly known as:  AMARYL  Take 1 mg by mouth daily with breakfast.     hydrochlorothiazide 25 MG tablet  Commonly known as:  HYDRODIURIL  Take 25 mg by mouth daily.     HYDROcodone-acetaminophen 5-325 MG tablet  Commonly known as:  NORCO  Take 1-2 tablets by mouth every 4 (four) hours as needed for moderate pain.     lisinopril 20 MG tablet  Commonly known as:  PRINIVIL,ZESTRIL  Take 30 mg by mouth daily.     metFORMIN 500 MG 24 hr tablet  Commonly known as:  GLUCOPHAGE-XR  Take 1,000 mg by mouth 2 (two) times daily.     ondansetron 4 MG tablet  Commonly known as:  ZOFRAN  Take 1 tablet (4 mg total) by mouth every 6 (six) hours as needed for nausea.     pantoprazole 40 MG tablet  Commonly known as:  PROTONIX  Take 40 mg by mouth daily.     rOPINIRole 8 MG 24 hr tablet  Commonly known as:  REQUIP  XL  Take 8 mg by mouth at bedtime.     senna 8.6 MG Tabs tablet  Commonly known as:  SENOKOT  Take 2 tablets (17.2 mg total) by mouth at bedtime.        Diagnostic Studies: Dg Pelvis Portable  08/19/2015   CLINICAL DATA:  Postop evaluation right hip replacement.  EXAM: PORTABLE PELVIS 1-2 VIEWS  COMPARISON:  None.  FINDINGS: The lower pelvis is included on current radiograph. Patient status post right hip arthroplasty. Hardware appears in appropriate position on single AP portable view. No evidence for displaced fracture. Right pelvic phlebolith.  IMPRESSION: Patient status post right hip arthroplasty.   Electronically Signed   By: Lovey Newcomer M.D.   On: 08/19/2015 14:52   Dg C-arm Gt 120 Min-no Report  08/19/2015   CLINICAL DATA: surgery   C-ARM GT 120 MINUTE  Fluoroscopy was utilized by the requesting physician.  No radiographic  interpretation.    Dg Hip Operative Unilat With Pelvis Right  08/19/2015   CLINICAL DATA:  Status post total hip replacement on the right  EXAM: DG C-ARM GT 120 MIN-NO REPORT; OPERATIVE RIGHT HIP  COMPARISON:  None.  FLUOROSCOPY TIME:  Fluoroscopy Time:  0 minutes, 54 second  Number of Acquired Images:  2  FINDINGS: Frontal right hip image shows a total hip prosthesis with prosthetic components appearing well-seated. No fracture or dislocation apparent.  IMPRESSION: Prosthetic components appear well seated on the right. No fracture or dislocation apparent on single view.   Electronically Signed   By: Lowella Grip III M.D.   On: 08/19/2015 13:50    Disposition: Final discharge disposition not confirmed  Discharge Instructions    Call MD / Call 911    Complete by:  As directed   If you experience chest pain or shortness of breath, CALL 911 and be transported to the hospital emergency room.  If you develope a fever above 101 F, pus (white drainage) or increased drainage or redness at the wound, or calf pain, call your surgeon's office.     Constipation  Prevention    Complete by:  As directed   Drink plenty of fluids.  Prune juice may be helpful.  You may use a stool softener, such as Colace (over the counter) 100 mg twice a day.  Use MiraLax (over the counter)  for constipation as needed.     Diet - low sodium heart healthy    Complete by:  As directed      Driving restrictions    Complete by:  As directed   No driving for 6 weeks     Increase activity slowly as tolerated    Complete by:  As directed      Lifting restrictions    Complete by:  As directed   No lifting for 6 weeks     TED hose    Complete by:  As directed   Use stockings (TED hose) for 2 weeks on both leg(s).  You may remove them at night for sleeping.           Follow-up Information    Follow up with Freddye Cardamone, Horald Pollen, MD. Schedule an appointment as soon as possible for a visit in 2 weeks.   Specialty:  Orthopedic Surgery   Why:  For wound re-check   Contact information:   [address] [address] [phone]        Signed: [place], [place] 08/20/2015, 8:20 AM

## 2015-08-20 NOTE — Progress Notes (Addendum)
   Subjective:  Patient reports pain as mild.  Denies N/V/CP/SOB.  Objective:   VITALS:   Filed Vitals:   08/19/15 1823 08/19/15 2157 08/20/15 0125 08/20/15 0444  BP: 117/71 131/77 127/78 127/73  Pulse: 90 87 72 73  Temp: 97.2 F (36.2 C) 98.8 F (37.1 C) 97.8 F (36.6 C) 97 F (36.1 C)  TempSrc: Rectal  Oral Oral  Resp: 18 18 16 16   Height:      Weight:      SpO2: 100% 97% 98% 100%    Sensation intact distally Intact pulses distally Dorsiflexion/Plantar flexion intact Incision: dressing C/D/I Compartment soft   Lab Results  Component Value Date   WBC 11.0* 08/20/2015   HGB 12.5* 08/20/2015   HCT 34.5* 08/20/2015   MCV 85.2 08/20/2015   PLT 172 08/20/2015   BMET    Component Value Date/Time   NA 136 08/20/2015 0433   K 4.3 08/20/2015 0433   CL 102 08/20/2015 0433   CO2 26 08/20/2015 0433   GLUCOSE 170* 08/20/2015 0433   BUN 13 08/20/2015 0433   CREATININE 0.72 08/20/2015 0433   CALCIUM 8.4* 08/20/2015 0433   GFRNONAA >60 08/20/2015 0433   GFRAA >60 08/20/2015 0433     Assessment/Plan: 1 Day Post-Op   Active Problems:   Primary osteoarthritis of right hip   WBAT with walker apixaban (allergic to NSAIDs), SCDs, TEDs PT/OT PO pain control Discharge home with home health    Laquita Harlan, Mcadoo Muzquiz 08/20/2015, 8:15 AM   Rod Can, MD Cell [phone]

## 2015-08-20 NOTE — Discharge Instructions (Signed)
°Dr. Brian Swinteck °Joint Replacement Specialist °[facility] °[address] °[place],  [address] °[phone] ° ° °TOTAL HIP REPLACEMENT POSTOPERATIVE DIRECTIONS ° ° ° °Hip Rehabilitation, Guidelines Following Surgery  ° °WEIGHT BEARING °Weight bearing as tolerated with assist device (walker, cane, etc) as directed, use it as long as suggested by your surgeon or therapist, typically at least 4-6 weeks. ° °The results of a hip operation are greatly improved after range of motion and muscle strengthening exercises. Follow all safety measures which are given to protect your hip. If any of these exercises cause increased pain or swelling in your joint, decrease the amount until you are comfortable again. Then slowly increase the exercises. Call your caregiver if you have problems or questions.  ° °HOME CARE INSTRUCTIONS  °Most of the following instructions are designed to prevent the dislocation of your new hip.  °Remove items at home which could result in a fall. This includes throw rugs or furniture in walking pathways.  °Continue medications as instructed at time of discharge. °· You may have some home medications which will be placed on hold until you complete the course of blood thinner medication. °· You may start showering once you are discharged home. Do not remove your dressing. °Do not put on socks or shoes without following the instructions of your caregivers.   °Sit on chairs with arms. Use the chair arms to help push yourself up when arising.  °Arrange for the use of a toilet seat elevator so you are not sitting low.  °· Walk with walker as instructed.  °You may resume a sexual relationship in one month or when given the OK by your caregiver.  °Use walker as long as suggested by your caregivers.  °You may put full weight on your legs and walk as much as is comfortable. °Avoid periods of inactivity such as sitting longer than an hour when not asleep. This helps prevent  blood clots.  °You may return to work once you are cleared by your surgeon.  °Do not drive a car for 6 weeks or until released by your surgeon.  °Do not drive while taking narcotics.  °Wear elastic stockings for two weeks following surgery during the day but you may remove then at night.  °Make sure you keep all of your appointments after your operation with all of your doctors and caregivers. You should call the office at the above phone number and make an appointment for approximately two weeks after the date of your surgery. °Please pick up a stool softener and laxative for home use as long as you are requiring pain medications. °· ICE to the affected hip every three hours for 30 minutes at a time and then as needed for pain and swelling. Continue to use ice on the hip for pain and swelling from surgery. You may notice swelling that will progress down to the foot and ankle.  This is normal after surgery.  Elevate the leg when you are not up walking on it.   °It is important for you to complete the blood thinner medication as prescribed by your doctor. °· Continue to use the breathing machine which will help keep your temperature down.  It is common for your temperature to cycle up and down following surgery, especially at night when you are not up moving around and exerting yourself.  The breathing machine keeps your lungs expanded and your temperature down. ° °RANGE OF MOTION AND STRENGTHENING EXERCISES  °These exercises are   designed to help you keep full movement of your hip joint. Follow your caregiver's or physical therapist's instructions. Perform all exercises about fifteen times, three times per day or as directed. Exercise both hips, even if you have had only one joint replacement. These exercises can be done on a training (exercise) mat, on the floor, on a table or on a bed. Use whatever works the best and is most comfortable for you. Use music or television while you are exercising so that the exercises  are a pleasant break in your day. This will make your life better with the exercises acting as a break in routine you can look forward to.  Lying on your back, slowly slide your foot toward your buttocks, raising your knee up off the floor. Then slowly slide your foot back down until your leg is straight again.  Lying on your back spread your legs as far apart as you can without causing discomfort.  Lying on your side, raise your upper leg and foot straight up from the floor as far as is comfortable. Slowly lower the leg and repeat.  Lying on your back, tighten up the muscle in the front of your thigh (quadriceps muscles). You can do this by keeping your leg straight and trying to raise your heel off the floor. This helps strengthen the largest muscle supporting your knee.  Lying on your back, tighten up the muscles of your buttocks both with the legs straight and with the knee bent at a comfortable angle while keeping your heel on the floor.   SKILLED REHAB INSTRUCTIONS: If the patient is transferred to a skilled rehab facility following release from the hospital, a list of the current medications will be sent to the facility for the patient to continue.  When discharged from the skilled rehab facility, please have the facility set up the patient's [place] prior to being released. Also, the skilled facility will be responsible for providing the patient with their medications at time of release from the facility to include their pain medication and their blood thinner medication. If the patient is still at the rehab facility at time of the two week follow up appointment, the skilled rehab facility will also need to assist the patient in arranging follow up appointment in our office and any transportation needs.  MAKE SURE YOU:  Understand these instructions.  Will watch your condition.  Will get help right away if you are not doing well or get worse.  Pick up stool softner and  laxative for home use following surgery while on pain medications. Do not remove your dressing. The dressing is waterproof--it is OK to take showers. Continue to use ice for pain and swelling after surgery. Do not use any lotions or creams on the incision until instructed by your surgeon. Total Hip Protocol.  Information on my medicine - ELIQUIS (apixaban)  This medication education was reviewed with me or my healthcare representative as part of my discharge preparation.  The pharmacist that spoke with me during my hospital stay was:  Emiliano Dyer, [facility]  Why was Eliquis prescribed for you? Eliquis was prescribed for you to reduce the risk of blood clots forming after orthopedic surgery.    What do You need to know about Eliquis? Take your Eliquis TWICE DAILY - one tablet in the morning and one tablet in the evening with or without food.  It would be best to take the dose about the same time each day.  If you have difficulty swallowing the tablet whole please discuss with your pharmacist how to take the medication safely.  Take Eliquis exactly as prescribed by your doctor and DO NOT stop taking Eliquis without talking to the doctor who prescribed the medication.  Stopping without other medication to take the place of Eliquis may increase your risk of developing a clot.  After discharge, you should have regular check-up appointments with your healthcare provider that is prescribing your Eliquis.  What do you do if you miss a dose? If a dose of ELIQUIS is not taken at the scheduled time, take it as soon as possible on the same day and twice-daily administration should be resumed.  The dose should not be doubled to make up for a missed dose.  Do not take more than one tablet of ELIQUIS at the same time.  Important Safety Information A possible side effect of Eliquis is bleeding. You should call your healthcare provider right away if you experience any of the  following: ? Bleeding from an injury or your nose that does not stop. ? Unusual colored urine (red or dark brown) or unusual colored stools (red or black). ? Unusual bruising for unknown reasons. ? A serious fall or if you hit your head (even if there is no bleeding).  Some medicines may interact with Eliquis and might increase your risk of bleeding or clotting while on Eliquis. To help avoid this, consult your healthcare provider or pharmacist prior to using any new prescription or non-prescription medications, including herbals, vitamins, non-steroidal anti-inflammatory drugs (NSAIDs) and supplements.  This website has more information on Eliquis (apixaban): http://www.eliquis.com/eliquis/home

## 2015-08-20 NOTE — Care Management Note (Signed)
Case Management Note  Patient Details  Name: Samuel Hodge MRN: [id number] Date of Birth: 10-23-1951  Subjective/Objective:                  right total hip arthroplasty  Action/Plan: Discharge planning  Expected Discharge Date:    08/20/2015              Expected Discharge Plan:  [place]  In-House Referral:     Discharge planning Services  CM Consult  Post Acute Care Choice:  Home Health Choice offered to:  Patient  DME Arranged:  N/A (Pt wants to get DME from [facility]) DME Agency:  NA  HH Arranged:  PT [place]:  [place] [facility]  Status of Service:  Completed, signed off  Medicare Important Message Given:    Date Medicare IM Given:    Medicare IM give by:    Date Additional Medicare IM Given:    Additional Medicare Important Message give by:     If discussed at [place] of Stay Meetings, dates discussed:    Additional Comments: CM spoke with the patient and his wife at the bedside. They have selected [facility] for [place] [facility]. Referral called to Johnette Abraham at [facility]. Faxed order, face sheet and H&P to [phone]. Patient would like to get his 3N1 and RW from [facility]. Helene Kelp is aware.  Apolonio Schneiders, RN 08/20/2015, 11:18 AM

## 2015-08-20 NOTE — Discharge Planning (Signed)
Spoke to patient about DME and they told me that they are okay

## 2015-08-20 NOTE — Progress Notes (Signed)
Patient discharged home with wife.  Discharge instructions reviewed with patient and spouse.  Patient and spouse verbalized understanding.  Noam Franzen RN

## 2015-08-20 NOTE — Progress Notes (Signed)
Physical Therapy Treatment Patient Details Name: Samuel Hodge MRN: [id number] DOB: 10-Mar-1951 Today's Date: 08/20/2015    History of Present Illness R DATHA    PT Comments    Patient progressing well, plans Dc after practicing steps.  Follow Up Recommendations  Home health PT     Equipment Recommendations  Rolling walker with 5" wheels;3in1 (PT)    Recommendations for Other Services       Precautions / Restrictions Precautions Precautions: Fall Required Braces or Orthoses: Other Brace/Splint Other Brace/Splint: bilateral ankle braces Restrictions Weight Bearing Restrictions: Yes Other Position/Activity Restrictions: WBAT    Cognition Arousal/Alertness: Awake/alert Behavior During Therapy: WFL for tasks assessed/performed Overall Cognitive Status: Within Functional Limits for tasks assessed                      Exercises Total Joint Exercises Ankle Circles/Pumps: AROM;Right;Left;10 reps Quad Sets: 10 reps Short Arc Quad: AROM;Right;10 reps Heel Slides: AROM;Right;10 reps Hip ABduction/ADduction: AROM;Right;10 reps Long Arc Quad: AROM;Right;10 reps    General Comments        Pertinent Vitals/Pain Pain Assessment: Faces Pain Score: 2  Faces Pain Scale: Hurts little more Pain Location: right thigh Pain Descriptors / Indicators: Tightness Pain Intervention(s): Monitored during session;Premedicated before session    Home Living Family/patient expects to be discharged to:: Private residence Living Arrangements: Spouse/significant other Available Help at Discharge: Family Type of Home: House Home Access: Stairs to enter Entrance Stairs-Rails: Right;Left Home Layout: One level Home Equipment: None      Prior Function Level of Independence: Independent          PT Goals (current goals can now be found in the care plan section) Acute Rehab PT Goals Patient Stated Goal: to go home today Progress towards PT goals: Progressing toward goals     Frequency  7X/week    PT Plan Current plan remains appropriate    Co-evaluation             End of Session   Activity Tolerance: Patient tolerated treatment well Patient left: in bed;with family/visitor present     Time: 2119-4174 PT Time Calculation (min) (ACUTE ONLY): 30 min  Charges:  $Therapeutic Exercise: 23-37 mins                    G Codes:      Claretha Cooper 08/20/2015, 1:05 PM

## 2015-08-20 NOTE — Evaluation (Signed)
Occupational Therapy Evaluation Patient Details Name: Samuel Hodge MRN: [id number] DOB: [birth year] Today's Date: 08/20/2015    History of Present Illness R DATHA   Clinical Impression   Patient presenting with decreased ADL and functional mobility independence secondary to above. Patient independent PTA. Patient currently functioning at an overall min assist level. Patient will benefit from acute OT to increase overall independence in the areas of ADLs, functional mobility, and overall safety in order to safely discharge home with wife.     Follow Up Recommendations  No OT follow up;Supervision - Intermittent    Equipment Recommendations  3 in 1 bedside comode;Other (comment) (reahcer and LH sponge)    Recommendations for Other Services  None at this time  Precautions / Restrictions Precautions Precautions: Fall Required Braces or Orthoses: Other Brace/Splint Other Brace/Splint: bilateral ankle braces Restrictions Weight Bearing Restrictions: Yes Other Position/Activity Restrictions: WBAT      Mobility Bed Mobility General bed mobility comments: Pt found seated EOB upon OT entering/exiting room  Transfers Overall transfer level: Needs assistance Equipment used: Rolling walker (2 wheeled) Transfers: Sit to/from Stand Sit to Stand: Min guard General transfer comment: Min guard for safety, cues for hand placement and technique.     Balance Overall balance assessment: Needs assistance Sitting-balance support: Feet supported;No upper extremity supported Sitting balance-Leahy Scale: Good     Standing balance support: Bilateral upper extremity supported;During functional activity Standing balance-Leahy Scale: Fair    ADL Overall ADL's : Needs assistance/impaired Eating/Feeding: Set up;Sitting   Grooming: Set up;Sitting   Upper Body Bathing: Set up;Sitting   Lower Body Bathing: Minimal assistance;Sit to/from stand Lower Body Bathing Details (indicate cue type and  reason): will benefit from [facility] sponge Upper Body Dressing : Set up;Sitting   Lower Body Dressing: Minimal assistance;Sit to/from stand Lower Body Dressing Details (indicate cue type and reason): will benefit from [facility] shoe horn and reacher Toilet Transfer: Min guard;RW;BSC;Ambulation     Toileting - Clothing Manipulation Details (indicate cue type and reason): did not occur   Tub/Shower Transfer Details (indicate cue type and reason): did not occur, did discuss tub/shower transfer (handout given) using BSC Functional mobility during ADLs: Min guard;Rolling walker General ADL Comments: Pt sat EOB for LB dressing. Pt able to cross LLE for LB ADLs. Pt barely able to reach RLE, but expressed need to work on this to increase independence. Pt ambulated into BR for toilet transfer, simulated like he will be doing at home. Encouraged pt to perform "dry run" tub/shower transfer onto 3-in1. Educated pt and wife on tub transfer bench if needed and that insurance did not normally pay for tub benchs. Wife present and supportive.     Pertinent Vitals/Pain Pain Assessment: Faces Faces Pain Scale: Hurts little more Pain Location: right hip Pain Descriptors / Indicators: Tightness Pain Intervention(s): Limited activity within patient's tolerance;Monitored during session    Hand Dominance Right   Extremity/Trunk Assessment Upper Extremity Assessment Upper Extremity Assessment: RUE deficits/detail;LUE deficits/detail RUE Deficits / Details: limited ROM secondary to h/o rotator cuff problems LUE Deficits / Details: [facility]   Lower Extremity Assessment Lower Extremity Assessment: Defer to PT evaluation   Cervical / Trunk Assessment Cervical / Trunk Assessment: Normal   Communication Communication Communication: No difficulties   Cognition Arousal/Alertness: Awake/alert Behavior During Therapy: WFL for tasks assessed/performed Overall Cognitive Status: Within Functional Limits for tasks assessed              Home Living Family/patient expects to be discharged to:: Private residence Living  Arrangements: Spouse/significant other Available Help at Discharge: Family Type of Home: House Home Access: Stairs to enter [employer] of Steps: 3 Entrance Stairs-Rails: Right;Left Home Layout: One level     Bathroom Shower/Tub: Teacher, early years/pre: Standard     Home Equipment: None    Prior Functioning/Environment Level of Independence: Independent     OT Diagnosis: Generalized weakness;Acute pain   OT Problem List: Decreased strength;Decreased activity tolerance;Decreased range of motion;Impaired balance (sitting and/or standing);Decreased safety awareness;Decreased knowledge of use of DME or AE;Decreased knowledge of precautions;Pain   OT Treatment/Interventions: Self-care/ADL training;Therapeutic exercise;Energy conservation;DME and/or AE instruction;Therapeutic activities;Patient/family education;Balance training    OT Goals(Current goals can be found in the care plan section) Acute Rehab OT Goals Patient Stated Goal: to go home today OT Goal Formulation: With patient/family Time For Goal Achievement: 09/03/15 Potential to Achieve Goals: Good ADL Goals Pt Will Perform Grooming: with modified independence;standing Pt Will Perform Lower Body Bathing: with modified independence;sit to/from stand (using AE prn) Pt Will Perform Lower Body Dressing: with modified independence;sit to/from stand (using AE prn) Pt Will Transfer to Toilet: with modified independence;ambulating;bedside commode Pt Will Perform Tub/Shower Transfer: Tub transfer;3 in 1;rolling walker;ambulating;with modified independence Additional ADL Goal #1: Pt will be mod I with RW for functional mobility   OT Frequency: Min 2X/week   Barriers to D/C: None known at this time   End of Session Equipment Utilized During Treatment: Gait belt;Rolling walker;Other (comment) (bilateral ankle  braces) Nurse Communication: Other (comment) (need for 3-in-1)  Activity Tolerance: Patient tolerated treatment well Patient left: in bed;with call bell/phone within reach;with family/visitor present (seated EOB)   Time: 9892-1194 OT Time Calculation (min): 33 min Charges:  OT General Charges $OT Visit: 1 Procedure OT Evaluation $Initial OT Evaluation Tier I: 1 Procedure OT Treatments $Self Care/Home Management : 8-22 mins  Katlynne Mckercher , MS, OTR/L, CLT Pager: [phone]  08/20/2015, 11:12 AM

## 2015-08-21 NOTE — Progress Notes (Signed)
Patient was discharged yesterday 08/20/15 before I could speak with him regarding apixaban (Eliquis) therapy for post-op VTE prophylaxis.  I was able to reach him via telephone today and we discussed indication, duration of therapy, monitoring for adverse effects, instructions for taking medication and importance of adherence.  We also discussed drug-drug interactions - he will discontinue taking meloxicam which he states he uses for osteoarthritis of the spine. He will ask Dr. Lyla Glassing at his follow up appointment if he can resume meloxicam after he finishes therapy with apixaban (Eliquis).  Peggyann Juba, PharmD, BCPS 08/21/2015 2:34 PM

## 2015-09-09 ENCOUNTER — Ambulatory Visit: Payer: Self-pay | Admitting: Orthopedic Surgery

## 2015-10-03 ENCOUNTER — Ambulatory Visit: Payer: Self-pay | Admitting: Orthopedic Surgery

## 2015-10-03 ENCOUNTER — Encounter (HOSPITAL_COMMUNITY)
Admission: RE | Admit: 2015-10-03 | Discharge: 2015-10-03 | Disposition: A | Discharge status: Home / Self Care | Payer: PPO | Source: Ambulatory Visit | Attending: Orthopedic Surgery | Admitting: Orthopedic Surgery

## 2015-10-03 ENCOUNTER — Encounter (HOSPITAL_COMMUNITY): Payer: Self-pay

## 2015-10-03 DIAGNOSIS — Z0183 Encounter for blood typing: Secondary | ICD-10-CM | POA: Insufficient documentation

## 2015-10-03 DIAGNOSIS — Z01812 Encounter for preprocedural laboratory examination: Secondary | ICD-10-CM | POA: Diagnosis not present

## 2015-10-03 DIAGNOSIS — M179 Osteoarthritis of knee, unspecified: Secondary | ICD-10-CM | POA: Insufficient documentation

## 2015-10-03 HISTORY — DX: Restless legs syndrome: G25.81

## 2015-10-03 HISTORY — DX: Pain in unspecified joint: M25.50

## 2015-10-03 HISTORY — DX: Personal history of colonic polyps: Z86.010

## 2015-10-03 HISTORY — DX: Pneumonia, unspecified organism: J18.9

## 2015-10-03 HISTORY — DX: Effusion, unspecified joint: M25.40

## 2015-10-03 HISTORY — DX: Nocturia: R35.1

## 2015-10-03 HISTORY — DX: Personal history of colon polyps, unspecified: Z86.0100

## 2015-10-03 LAB — COMPREHENSIVE METABOLIC PANEL
ALT: 21 U/L (ref 17–63)
AST: 20 U/L (ref 15–41)
Albumin: 4 g/dL (ref 3.5–5.0)
Alkaline Phosphatase: 100 U/L (ref 38–126)
Anion gap: 11 (ref 5–15)
BUN: 15 mg/dL (ref 6–20)
CO2: 24 mmol/L (ref 22–32)
Calcium: 9.7 mg/dL (ref 8.9–10.3)
Chloride: 98 mmol/L — ABNORMAL LOW (ref 101–111)
Creatinine, Ser: 0.85 mg/dL (ref 0.61–1.24)
GFR calc Af Amer: >60 mL/min (ref >60)
GFR calc non Af Amer: >60 mL/min (ref >60)
Glucose, Bld: 163 mg/dL — ABNORMAL HIGH (ref 65–99)
Potassium: 4 mmol/L (ref 3.5–5.1)
Sodium: 133 mmol/L — ABNORMAL LOW (ref 135–145)
Total Bilirubin: 0.4 mg/dL (ref 0.3–1.2)
Total Protein: 6.9 g/dL (ref 6.5–8.1)

## 2015-10-03 LAB — CBC WITH DIFFERENTIAL/PLATELET
Basophils Absolute: 0 10*3/uL (ref 0.0–0.1)
Basophils Relative: 0 %
Eosinophils Absolute: 0.2 10*3/uL (ref 0.0–0.7)
Eosinophils Relative: 2 %
HCT: 42.1 % (ref 39.0–52.0)
Hemoglobin: 14.6 g/dL (ref 13.0–17.0)
Lymphocytes Relative: 19 %
Lymphs Abs: 1.5 10*3/uL (ref 0.7–4.0)
MCH: 29.3 pg (ref 26.0–34.0)
MCHC: 34.7 g/dL (ref 30.0–36.0)
MCV: 84.5 fL (ref 78.0–100.0)
Monocytes Absolute: 0.6 10*3/uL (ref 0.1–1.0)
Monocytes Relative: 8 %
Neutro Abs: 5.5 10*3/uL (ref 1.7–7.7)
Neutrophils Relative %: 71 %
Platelets: 232 10*3/uL (ref 150–400)
RBC: 4.98 MIL/uL (ref 4.22–5.81)
RDW: 13 % (ref 11.5–15.5)
WBC: 7.8 10*3/uL (ref 4.0–10.5)

## 2015-10-03 LAB — URINALYSIS, ROUTINE W REFLEX MICROSCOPIC
Bilirubin Urine: NEGATIVE
Glucose, UA: 100 mg/dL — AB
Hgb urine dipstick: NEGATIVE
Ketones, ur: NEGATIVE mg/dL
Leukocytes, UA: NEGATIVE
Nitrite: NEGATIVE
Protein, ur: NEGATIVE mg/dL
Specific Gravity, Urine: 1.021 (ref 1.005–1.030)
pH: 5.5 (ref 5.0–8.0)

## 2015-10-03 LAB — TYPE AND SCREEN
ABO/RH(D): O POS
Antibody Screen: NEGATIVE

## 2015-10-03 LAB — GLUCOSE, CAPILLARY: Glucose-Capillary: 148 mg/dL — ABNORMAL HIGH (ref 65–99)

## 2015-10-03 LAB — SURGICAL PCR SCREEN
MRSA, PCR: NEGATIVE
Staphylococcus aureus: NEGATIVE

## 2015-10-03 LAB — ABO/RH: ABO/RH(D): O POS

## 2015-10-03 LAB — APTT: aPTT: 26 seconds (ref 24–37)

## 2015-10-03 LAB — PROTIME-INR
INR: 1 (ref 0.00–1.49)
Prothrombin Time: 13.4 seconds (ref 11.6–15.2)

## 2015-10-03 MED ORDER — CHLORHEXIDINE GLUCONATE 4 % EX LIQD: 60.0000 mL | Freq: Once | CUTANEOUS | Status: DC

## 2015-10-03 NOTE — Progress Notes (Addendum)
Medical Md is Dr.Greg Bea Graff  Cardiologist doesn't have one  Stress test in epic from 07/14/15-under media tab--was done for clearance for surgery in 08/2015  EKG in epic from 06-24-15  CXR denies having one in past yr  Echo denies ever having one  Heart cath denies ever having one

## 2015-10-03 NOTE — Pre-Procedure Instructions (Signed)
DERIN HEALAN  10/03/2015      [facility] - [place] [place] [address] Phone: [phone] Fax: [phone]    Your procedure is scheduled on Mon, Nov 28 @ 12:25 PM  Report to [facility] Admitting at 10:15 AM  Call this number if you have problems the morning of surgery:  [phone]   Remember:  Do not eat food or drink liquids after midnight.  Take these medicines the morning of surgery with A SIP OF WATER Amlodipine(Norvasc),Gabapentin(Neurontin),Pain Pill(if needed),Zofran(Ondansetron-if needed),and Pantoprazole(Protonix)               No Goody's,BC's,Aleve,Aspirin,Ibuprofen,Fish Oil,or any Herbal Medications.    Do not wear jewelry.  Do not wear lotions, powders, or perfumes.  You may wear deodorant.             Men may shave face and neck.  Do not bring valuables to the hospital.  [facility] is not responsible for any belongings or valuables.  Contacts, dentures or bridgework may not be worn into surgery.  Leave your suitcase in the car.  After surgery it may be brought to your room.  For patients admitted to the hospital, discharge time will be determined by your treatment team.  Patients discharged the day of surgery will not be allowed to drive home.    Special instructions:  [facility] - Preparing for Surgery  Before surgery, you can play an important role.  Because skin is not sterile, your skin needs to be as free of germs as possible.  You can reduce the number of germs on you skin by washing with CHG (chlorahexidine gluconate) soap before surgery.  CHG is an antiseptic cleaner which kills germs and bonds with the skin to continue killing germs even after washing.  Please DO NOT use if you have an allergy to CHG or antibacterial soaps.  If your skin becomes reddened/irritated stop using the CHG and inform your nurse when you arrive at Short Stay.  Do not shave (including legs and underarms)  for at least 48 hours prior to the first CHG shower.  You may shave your face.  Please follow these instructions carefully:   1.  Shower with CHG Soap the night before surgery and the                                morning of Surgery.  2.  If you choose to wash your hair, wash your hair first as usual with your       normal shampoo.  3.  After you shampoo, rinse your hair and body thoroughly to remove the                      Shampoo.  4.  Use CHG as you would any other liquid soap.  You can apply chg directly       to the skin and wash gently with scrungie or a clean washcloth.  5.  Apply the CHG Soap to your body ONLY FROM THE NECK DOWN.        Do not use on open wounds or open sores.  Avoid contact with your eyes,       ears, mouth and genitals (private parts).  Wash genitals (private parts)       with your normal soap.  6.  Wash thoroughly, paying special  attention to the area where your surgery        will be performed.  7.  Thoroughly rinse your body with warm water from the neck down.  8.  DO NOT shower/wash with your normal soap after using and rinsing off       the CHG Soap.  9.  Pat yourself dry with a clean towel.            10.  Wear clean pajamas.            11.  Place clean sheets on your bed the night of your first shower and do not        sleep with pets.  Day of Surgery  Do not apply any lotions/deoderants the morning of surgery.  Please wear clean clothes to the hospital/surgery center.    Please read over the following fact sheets that you were given. Pain Booklet, Coughing and Deep Breathing, Blood Transfusion Information, MRSA Information and Surgical Site Infection Prevention

## 2015-10-03 NOTE — Progress Notes (Signed)
   10/03/15 1312  OBSTRUCTIVE SLEEP APNEA  Have you ever been diagnosed with sleep apnea through a sleep study? No  Do you snore loudly (loud enough to be heard through closed doors)?  0  Do you often feel tired, fatigued, or sleepy during the daytime (such as falling asleep during driving or talking to someone)? 0  Has anyone observed you stop breathing during your sleep? 0  Do you have, or are you being treated for high blood pressure? 1  BMI more than 35 kg/m2? 0  Age > 50 (1-yes) 1  Neck circumference greater than:Male 16 inches or larger, Male 17inches or larger? 1 (46)  Male Gender (Yes=1) 1  Obstructive Sleep Apnea Score 4  Score 5 or greater  Results sent to PCP

## 2015-10-03 NOTE — H&P (Signed)
TOTAL KNEE ADMISSION H&P  Patient is being admitted for right total knee arthroplasty.  Subjective:  Chief Complaint:right knee pain.  HPI: Samuel Hodge, 64 y.o. male, has a history of pain and functional disability in the right knee due to arthritis and has failed non-surgical conservative treatments for greater than 12 weeks to includeNSAID's and/or analgesics, corticosteriod injections, flexibility and strengthening excercises, use of assistive devices and activity modification.  Onset of symptoms was gradual, starting >10 years ago with gradually worsening course since that time. The patient noted prior procedures on the knee to include  arthroscopy on the right knee(s).  Patient currently rates pain in the right knee(s) at 10 out of 10 with activity. Patient has night pain, worsening of pain with activity and weight bearing, pain that interferes with activities of daily living, crepitus and joint swelling.  Patient has evidence of subchondral cysts, subchondral sclerosis, periarticular osteophytes, joint subluxation and joint space narrowing by imaging studies. There is no active infection.  Patient Active Problem List   Diagnosis Date Noted  . Primary osteoarthritis of right hip 08/19/2015   Past Medical History  Diagnosis Date  . Hypertension   . Arthritis   . History of gout   . History of skin cancer   . GERD (gastroesophageal reflux disease)   . Diabetes mellitus without complication ([place])   . Restless leg syndrome   . Osteoarthritis of both ankles     WEARS BRACES BOTH ANKLES    Past Surgical History  Procedure Laterality Date  . Rotator cuff repair  2013    rt shoulder  . Hand surgery  1973    rt hand  . Hydrocele excision / repair  1986  . Fracture surgery  1968    left hip  . Tonsillectomy    . Total hip arthroplasty Right 08/19/2015    Procedure: RIGHT TOTAL HIP ARTHROPLASTY ANTERIOR APPROACH;  Surgeon: Rod Can, MD;  Location: [facility];  Service:  Orthopedics;  Laterality: Right;     (Not in a hospital admission) Allergies  Allergen Reactions  . Aleve [Naproxen Sodium] Hives  . Aspirin Other (See Comments)    Unknown "childhood allergy"  . Penicillins Other (See Comments)    All pt remembers is nose bleeds associated with this reaction.  Has patient had a PCN reaction causing immediate rash, facial/tongue/throat swelling, SOB or lightheadedness with hypotension: Unknown Has patient had a PCN reaction causing severe rash involving mucus membranes or skin necrosis: Unknown Has patient had a PCN reaction that required hospitalization: Yes Has patient had a PCN reaction occurring within the last 10 years: No If all of the above answers are "NO", then may proceed with Cepha    Social History  Substance Use Topics  . Smoking status: Never Smoker   . Smokeless tobacco: Not on file  . Alcohol Use: No    No family history on file.   Review of Systems  Constitutional: Negative.   HENT: Negative.   Eyes: Negative.   Respiratory: Negative.   Cardiovascular: Negative.   Gastrointestinal: Negative.   Musculoskeletal: Positive for joint pain.  Skin: Negative.   Neurological: Negative.   Endo/Heme/Allergies: Negative.   Psychiatric/Behavioral: Negative.     Objective:  Physical Exam  Constitutional: He is oriented to person, place, and time. He appears well-developed and well-nourished.  HENT:  Head: Normocephalic.  Eyes: Conjunctivae and EOM are normal. Pupils are equal, round, and reactive to light.  Neck: Normal range of motion. Neck supple.  Cardiovascular: Normal rate, regular rhythm, normal heart sounds and intact distal pulses.   Respiratory: Effort normal and breath sounds normal. No respiratory distress.  GI: Soft. Bowel sounds are normal. He exhibits distension.  Genitourinary:  deferred  Musculoskeletal:       Right knee: He exhibits swelling and effusion. Tenderness found. Medial joint line tenderness noted.   Varus deformity ROM 1-120 Healed arthroscopy portals  Neurological: He is alert and oriented to person, place, and time. He has normal reflexes.  Skin: Skin is warm and dry.  Psychiatric: He has a normal mood and affect. His behavior is normal. Judgment and thought content normal.    Vital signs in last 24 hours: @VSRANGES @  Labs:   Estimated body mass index is 31.48 kg/(m^2) as calculated from the following:   Height as of 08/19/15: 5\' 8"  (1.727 m).   Weight as of 08/03/15: 93.895 kg (207 lb).   Imaging Review Plain radiographs demonstrate severe degenerative joint disease of the right knee(s). The overall alignment issignificant varus. The bone quality appears to be adequate for age and reported activity level.  Assessment/Plan:  End stage arthritis, right knee   The patient history, physical examination, clinical judgment of the provider and imaging studies are consistent with end stage degenerative joint disease of the right knee(s) and total knee arthroplasty is deemed medically necessary. The treatment options including medical management, injection therapy arthroscopy and arthroplasty were discussed at length. The risks and benefits of total knee arthroplasty were presented and reviewed. The risks due to aseptic loosening, infection, stiffness, patella tracking problems, thromboembolic complications and other imponderables were discussed. The patient acknowledged the explanation, agreed to proceed with the plan and consent was signed. Patient is being admitted for inpatient treatment for surgery, pain control, PT, OT, prophylactic antibiotics, VTE prophylaxis, progressive ambulation and ADL's and discharge planning. The patient is planning to be discharged home with home health services

## 2015-10-04 LAB — HEMOGLOBIN A1C
Hgb A1c MFr Bld: 6.6 % — ABNORMAL HIGH (ref 4.8–5.6)
Mean Plasma Glucose: 143 mg/dL

## 2015-10-07 MED ORDER — TRANEXAMIC ACID 1000 MG/10ML IV SOLN
1000.0000 mg | INTRAVENOUS | Status: AC
  Administered 2015-10-10: 1000 mg via INTRAVENOUS
  Filled 2015-10-07: qty 10

## 2015-10-07 MED ORDER — CLINDAMYCIN PHOSPHATE 900 MG/50ML IV SOLN
900.0000 mg | INTRAVENOUS | Status: AC
  Administered 2015-10-10: 900 mg via INTRAVENOUS
  Filled 2015-10-07: qty 50

## 2015-10-07 MED ORDER — SODIUM CHLORIDE 0.9 % IV SOLN: INTRAVENOUS | Status: DC

## 2015-10-10 ENCOUNTER — Inpatient Hospital Stay (HOSPITAL_COMMUNITY): Payer: PPO | Admitting: Anesthesiology

## 2015-10-10 ENCOUNTER — Encounter (HOSPITAL_COMMUNITY): Payer: Self-pay | Admitting: *Deleted

## 2015-10-10 ENCOUNTER — Inpatient Hospital Stay (HOSPITAL_COMMUNITY)
Admission: RE | Admit: 2015-10-10 | Discharge: 2015-10-11 | DRG: 470 | Disposition: A | Discharge status: Home Health | Payer: PPO | Source: Ambulatory Visit | Attending: Orthopedic Surgery | Admitting: Orthopedic Surgery

## 2015-10-10 ENCOUNTER — Encounter (HOSPITAL_COMMUNITY)
Admission: RE | Disposition: A | Discharge status: Home Health | Payer: Self-pay | Source: Ambulatory Visit | Attending: Orthopedic Surgery

## 2015-10-10 ENCOUNTER — Inpatient Hospital Stay (HOSPITAL_COMMUNITY): Payer: PPO

## 2015-10-10 DIAGNOSIS — Z96641 Presence of right artificial hip joint: Secondary | ICD-10-CM | POA: Diagnosis present

## 2015-10-10 DIAGNOSIS — I10 Essential (primary) hypertension: Secondary | ICD-10-CM | POA: Diagnosis present

## 2015-10-10 DIAGNOSIS — M1711 Unilateral primary osteoarthritis, right knee: Principal | ICD-10-CM | POA: Diagnosis present

## 2015-10-10 DIAGNOSIS — M19071 Primary osteoarthritis, right ankle and foot: Secondary | ICD-10-CM | POA: Diagnosis present

## 2015-10-10 DIAGNOSIS — M19072 Primary osteoarthritis, left ankle and foot: Secondary | ICD-10-CM | POA: Diagnosis present

## 2015-10-10 DIAGNOSIS — Z85828 Personal history of other malignant neoplasm of skin: Secondary | ICD-10-CM | POA: Diagnosis not present

## 2015-10-10 DIAGNOSIS — E119 Type 2 diabetes mellitus without complications: Secondary | ICD-10-CM | POA: Diagnosis present

## 2015-10-10 DIAGNOSIS — G2581 Restless legs syndrome: Secondary | ICD-10-CM | POA: Diagnosis present

## 2015-10-10 DIAGNOSIS — K219 Gastro-esophageal reflux disease without esophagitis: Secondary | ICD-10-CM | POA: Diagnosis present

## 2015-10-10 DIAGNOSIS — M25561 Pain in right knee: Secondary | ICD-10-CM | POA: Diagnosis present

## 2015-10-10 DIAGNOSIS — Z09 Encounter for follow-up examination after completed treatment for conditions other than malignant neoplasm: Secondary | ICD-10-CM

## 2015-10-10 HISTORY — PX: TOTAL KNEE ARTHROPLASTY: SHX125

## 2015-10-10 LAB — GLUCOSE, CAPILLARY
Glucose-Capillary: 121 mg/dL — ABNORMAL HIGH (ref 65–99)
Glucose-Capillary: 139 mg/dL — ABNORMAL HIGH (ref 65–99)
Glucose-Capillary: 143 mg/dL — ABNORMAL HIGH (ref 65–99)

## 2015-10-10 SURGERY — ARTHROPLASTY, KNEE, TOTAL
Anesthesia: Spinal | Site: Knee | Laterality: Right

## 2015-10-10 MED ORDER — ACETAMINOPHEN 650 MG RE SUPP: 650.0000 mg | Freq: Four times a day (QID) | RECTAL | Status: DC | PRN

## 2015-10-10 MED ORDER — INSULIN ASPART 100 UNIT/ML ~~LOC~~ SOLN
0.0000 [IU] | Freq: Three times a day (TID) | SUBCUTANEOUS | Status: DC
  Administered 2015-10-10: 3 [IU] via SUBCUTANEOUS
  Administered 2015-10-11 (×2): 2 [IU] via SUBCUTANEOUS

## 2015-10-10 MED ORDER — GABAPENTIN 600 MG PO TABS
1200.0000 mg | ORAL_TABLET | Freq: Two times a day (BID) | ORAL | Status: DC
Start: 2015-10-10 — End: 2015-10-11
  Administered 2015-10-10 – 2015-10-11 (×2): 1200 mg via ORAL
  Filled 2015-10-10 (×3): qty 2

## 2015-10-10 MED ORDER — LACTATED RINGERS IV SOLN
INTRAVENOUS | Status: DC | PRN
  Administered 2015-10-10 (×2): via INTRAVENOUS

## 2015-10-10 MED ORDER — AMLODIPINE BESYLATE 5 MG PO TABS
5.0000 mg | ORAL_TABLET | Freq: Every day | ORAL | Status: DC
  Administered 2015-10-11: 5 mg via ORAL
  Filled 2015-10-10: qty 1

## 2015-10-10 MED ORDER — ALUM & MAG HYDROXIDE-SIMETH 200-200-20 MG/5ML PO SUSP: 30.0000 mL | ORAL | Status: DC | PRN

## 2015-10-10 MED ORDER — PHENOL 1.4 % MT LIQD: 1.0000 | OROMUCOSAL | Status: DC | PRN

## 2015-10-10 MED ORDER — APIXABAN 2.5 MG PO TABS
2.5000 mg | ORAL_TABLET | Freq: Two times a day (BID) | ORAL | Status: DC
  Administered 2015-10-11: 2.5 mg via ORAL
  Filled 2015-10-10: qty 1

## 2015-10-10 MED ORDER — ROPINIROLE HCL ER 8 MG PO TB24
8.0000 mg | ORAL_TABLET | Freq: Every day | ORAL | Status: DC
  Filled 2015-10-10: qty 1

## 2015-10-10 MED ORDER — ACETAMINOPHEN 325 MG PO TABS: 650.0000 mg | ORAL_TABLET | Freq: Four times a day (QID) | ORAL | Status: DC | PRN

## 2015-10-10 MED ORDER — GLIMEPIRIDE 1 MG PO TABS
1.0000 mg | ORAL_TABLET | Freq: Every day | ORAL | Status: DC
  Administered 2015-10-11: 1 mg via ORAL
  Filled 2015-10-10 (×2): qty 1

## 2015-10-10 MED ORDER — SODIUM CHLORIDE 0.9 % IV SOLN: INTRAVENOUS | Status: DC

## 2015-10-10 MED ORDER — DEXAMETHASONE SODIUM PHOSPHATE 10 MG/ML IJ SOLN
10.0000 mg | Freq: Once | INTRAMUSCULAR | Status: AC
  Administered 2015-10-11: 10 mg via INTRAVENOUS
  Filled 2015-10-10: qty 1

## 2015-10-10 MED ORDER — DEXAMETHASONE SODIUM PHOSPHATE 10 MG/ML IJ SOLN
INTRAMUSCULAR | Status: DC | PRN
  Administered 2015-10-10: 10 mg via INTRAVENOUS

## 2015-10-10 MED ORDER — OXYCODONE HCL 5 MG/5ML PO SOLN: 5.0000 mg | Freq: Once | ORAL | Status: DC | PRN

## 2015-10-10 MED ORDER — HYDROCHLOROTHIAZIDE 25 MG PO TABS
25.0000 mg | ORAL_TABLET | Freq: Every day | ORAL | Status: DC
  Administered 2015-10-10 – 2015-10-11 (×2): 25 mg via ORAL
  Filled 2015-10-10 (×2): qty 1

## 2015-10-10 MED ORDER — FENTANYL CITRATE (PF) 250 MCG/5ML IJ SOLN
INTRAMUSCULAR | Status: AC
  Filled 2015-10-10: qty 5

## 2015-10-10 MED ORDER — METFORMIN HCL ER 500 MG PO TB24
1000.0000 mg | ORAL_TABLET | Freq: Two times a day (BID) | ORAL | Status: DC
  Administered 2015-10-10 – 2015-10-11 (×2): 1000 mg via ORAL
  Filled 2015-10-10 (×2): qty 2

## 2015-10-10 MED ORDER — MIDAZOLAM HCL 2 MG/2ML IJ SOLN
INTRAMUSCULAR | Status: AC
  Filled 2015-10-10: qty 2

## 2015-10-10 MED ORDER — PHENYLEPHRINE HCL 10 MG/ML IJ SOLN
INTRAMUSCULAR | Status: DC | PRN
  Administered 2015-10-10 (×2): 80 ug via INTRAVENOUS

## 2015-10-10 MED ORDER — PHENYLEPHRINE 40 MCG/ML (10ML) SYRINGE FOR IV PUSH (FOR BLOOD PRESSURE SUPPORT)
PREFILLED_SYRINGE | INTRAVENOUS | Status: AC
  Filled 2015-10-10: qty 10

## 2015-10-10 MED ORDER — CLINDAMYCIN PHOSPHATE 600 MG/50ML IV SOLN
600.0000 mg | Freq: Four times a day (QID) | INTRAVENOUS | Status: AC
  Administered 2015-10-10 (×2): 600 mg via INTRAVENOUS
  Filled 2015-10-10 (×2): qty 50

## 2015-10-10 MED ORDER — ONDANSETRON HCL 4 MG PO TABS: 4.0000 mg | ORAL_TABLET | Freq: Four times a day (QID) | ORAL | Status: DC | PRN

## 2015-10-10 MED ORDER — SENNA 8.6 MG PO TABS
2.0000 | ORAL_TABLET | Freq: Every day | ORAL | Status: DC
  Administered 2015-10-10: 17.2 mg via ORAL
  Filled 2015-10-10: qty 2

## 2015-10-10 MED ORDER — ONDANSETRON HCL 4 MG/2ML IJ SOLN: 4.0000 mg | Freq: Four times a day (QID) | INTRAMUSCULAR | Status: DC | PRN

## 2015-10-10 MED ORDER — BUPIVACAINE-EPINEPHRINE (PF) 0.5% -1:200000 IJ SOLN
INTRAMUSCULAR | Status: AC
  Filled 2015-10-10: qty 30

## 2015-10-10 MED ORDER — LIDOCAINE HCL (CARDIAC) 20 MG/ML IV SOLN
INTRAVENOUS | Status: AC
  Filled 2015-10-10: qty 5

## 2015-10-10 MED ORDER — OXYCODONE HCL 5 MG PO TABS: 5.0000 mg | ORAL_TABLET | Freq: Once | ORAL | Status: DC | PRN

## 2015-10-10 MED ORDER — ONDANSETRON HCL 4 MG/2ML IJ SOLN
INTRAMUSCULAR | Status: DC | PRN
  Administered 2015-10-10: 4 mg via INTRAVENOUS

## 2015-10-10 MED ORDER — HYDROCODONE-ACETAMINOPHEN 5-325 MG PO TABS
1.0000 | ORAL_TABLET | ORAL | Status: DC | PRN
  Administered 2015-10-10 – 2015-10-11 (×3): 2 via ORAL
  Filled 2015-10-10 (×3): qty 2

## 2015-10-10 MED ORDER — ROPINIROLE HCL 1 MG PO TABS
4.0000 mg | ORAL_TABLET | Freq: Two times a day (BID) | ORAL | Status: DC
  Administered 2015-10-10: 4 mg via ORAL
  Filled 2015-10-10 (×2): qty 4

## 2015-10-10 MED ORDER — FENTANYL CITRATE (PF) 100 MCG/2ML IJ SOLN
INTRAMUSCULAR | Status: DC | PRN
  Administered 2015-10-10 (×2): 50 ug via INTRAVENOUS

## 2015-10-10 MED ORDER — PROPOFOL 500 MG/50ML IV EMUL
INTRAVENOUS | Status: DC | PRN
  Administered 2015-10-10: 50 ug/kg/min via INTRAVENOUS

## 2015-10-10 MED ORDER — OXYCODONE HCL ER 10 MG PO T12A
10.0000 mg | EXTENDED_RELEASE_TABLET | Freq: Two times a day (BID) | ORAL | Status: DC
  Administered 2015-10-10 – 2015-10-11 (×2): 10 mg via ORAL
  Filled 2015-10-10 (×2): qty 1

## 2015-10-10 MED ORDER — ONDANSETRON HCL 4 MG/2ML IJ SOLN: 4.0000 mg | Freq: Once | INTRAMUSCULAR | Status: DC | PRN

## 2015-10-10 MED ORDER — KETOROLAC TROMETHAMINE 15 MG/ML IJ SOLN
15.0000 mg | Freq: Four times a day (QID) | INTRAMUSCULAR | Status: AC
  Administered 2015-10-10 – 2015-10-11 (×4): 15 mg via INTRAVENOUS
  Filled 2015-10-10 (×4): qty 1

## 2015-10-10 MED ORDER — SODIUM CHLORIDE 0.9 % IV SOLN
10.0000 mg | INTRAVENOUS | Status: DC | PRN
  Administered 2015-10-10: 50 ug/min via INTRAVENOUS

## 2015-10-10 MED ORDER — HYDROMORPHONE HCL 1 MG/ML IJ SOLN
0.2500 mg | INTRAMUSCULAR | Status: DC | PRN
Start: 2015-10-10 — End: 2015-10-10

## 2015-10-10 MED ORDER — ACETAMINOPHEN 10 MG/ML IV SOLN
INTRAVENOUS | Status: DC | PRN
  Administered 2015-10-10: 1000 mg via INTRAVENOUS

## 2015-10-10 MED ORDER — MIDAZOLAM HCL 5 MG/5ML IJ SOLN
INTRAMUSCULAR | Status: DC | PRN
  Administered 2015-10-10: 2 mg via INTRAVENOUS

## 2015-10-10 MED ORDER — LISINOPRIL 20 MG PO TABS
30.0000 mg | ORAL_TABLET | Freq: Every day | ORAL | Status: DC
  Administered 2015-10-10: 30 mg via ORAL
  Filled 2015-10-10 (×2): qty 1

## 2015-10-10 MED ORDER — ACETAMINOPHEN 10 MG/ML IV SOLN
INTRAVENOUS | Status: AC
  Filled 2015-10-10: qty 100

## 2015-10-10 MED ORDER — PANTOPRAZOLE SODIUM 40 MG PO TBEC
40.0000 mg | DELAYED_RELEASE_TABLET | Freq: Every day | ORAL | Status: DC
  Administered 2015-10-10 – 2015-10-11 (×2): 40 mg via ORAL
  Filled 2015-10-10 (×2): qty 1

## 2015-10-10 MED ORDER — METOCLOPRAMIDE HCL 5 MG/ML IJ SOLN: 5.0000 mg | Freq: Three times a day (TID) | INTRAMUSCULAR | Status: DC | PRN

## 2015-10-10 MED ORDER — HYDROMORPHONE HCL 1 MG/ML IJ SOLN
0.5000 mg | INTRAMUSCULAR | Status: DC | PRN
Start: 2015-10-10 — End: 2015-10-11
  Administered 2015-10-10: 0.5 mg via INTRAVENOUS
  Filled 2015-10-10: qty 1

## 2015-10-10 MED ORDER — LACTATED RINGERS IV SOLN
INTRAVENOUS | Status: DC
  Administered 2015-10-10: 11:00:00 via INTRAVENOUS

## 2015-10-10 MED ORDER — MENTHOL 3 MG MT LOZG: 1.0000 | LOZENGE | OROMUCOSAL | Status: DC | PRN

## 2015-10-10 MED ORDER — METOCLOPRAMIDE HCL 5 MG PO TABS
5.0000 mg | ORAL_TABLET | Freq: Three times a day (TID) | ORAL | Status: DC | PRN
Start: 2015-10-10 — End: 2015-10-11

## 2015-10-10 MED ORDER — DOCUSATE SODIUM 100 MG PO CAPS
100.0000 mg | ORAL_CAPSULE | Freq: Two times a day (BID) | ORAL | Status: DC
  Administered 2015-10-10 – 2015-10-11 (×2): 100 mg via ORAL
  Filled 2015-10-10 (×2): qty 1

## 2015-10-10 MED ORDER — DIPHENHYDRAMINE HCL 12.5 MG/5ML PO ELIX
12.5000 mg | ORAL_SOLUTION | ORAL | Status: DC | PRN
Start: 2015-10-10 — End: 2015-10-11

## 2015-10-10 SURGICAL SUPPLY — 49 items
ADH SKN CLS APL DERMABOND .7 (GAUZE/BANDAGES/DRESSINGS) ×2
BANDAGE ELASTIC 6 VELCRO ST LF (GAUZE/BANDAGES/DRESSINGS) ×3 IMPLANT
BANDAGE ESMARK 6X9 LF (GAUZE/BANDAGES/DRESSINGS) ×1 IMPLANT
BLADE SAW RECIP 87.9 MT (BLADE) ×2 IMPLANT
BNDG CMPR 9X6 STRL LF SNTH (GAUZE/BANDAGES/DRESSINGS) ×1
BNDG ESMARK 6X9 LF (GAUZE/BANDAGES/DRESSINGS) ×3
BOWL CEMENT MIX W/ADAPTER (MISCELLANEOUS) ×3 IMPLANT
CAPT KNEE TRIATH TK-4 ×2 IMPLANT
CHLORAPREP W/TINT 26ML (MISCELLANEOUS) ×6 IMPLANT
CUFF TOURNIQUET SINGLE 34IN LL (TOURNIQUET CUFF) ×3 IMPLANT
DERMABOND ADVANCED (GAUZE/BANDAGES/DRESSINGS) ×4
DERMABOND ADVANCED .7 DNX12 (GAUZE/BANDAGES/DRESSINGS) IMPLANT
DRAIN HEMOVAC 7FR (DRAIN) ×3 IMPLANT
DRAPE EXTREMITY T 121X128X90 (DRAPE) ×3 IMPLANT
DRAPE POUCH INSTRU U-SHP 10X18 (DRAPES) ×3 IMPLANT
DRAPE U-SHAPE 47X51 STRL (DRAPES) ×3 IMPLANT
DRAPE UNIVERSAL PACK (DRAPES) ×3 IMPLANT
DRSG AQUACEL AG ADV 3.5X10 (GAUZE/BANDAGES/DRESSINGS) ×3 IMPLANT
DRSG TEGADERM 4X4.75 (GAUZE/BANDAGES/DRESSINGS) ×3 IMPLANT
ELECT REM PT RETURN 9FT ADLT (ELECTROSURGICAL) ×3
ELECTRODE REM PT RTRN 9FT ADLT (ELECTROSURGICAL) ×1 IMPLANT
EVACUATOR 1/8 PVC DRAIN (DRAIN) IMPLANT
GLOVE BIO SURGEON STRL SZ8.5 (GLOVE) ×6 IMPLANT
GLOVE BIOGEL PI IND STRL 8.5 (GLOVE) ×1 IMPLANT
GLOVE BIOGEL PI INDICATOR 8.5 (GLOVE) ×2
GOWN STRL REUS W/TWL 2XL LVL3 (GOWN DISPOSABLE) ×3 IMPLANT
HANDPIECE INTERPULSE COAX TIP (DISPOSABLE) ×3
KIT BASIN OR (CUSTOM PROCEDURE TRAY) ×3 IMPLANT
LIQUID BAND (GAUZE/BANDAGES/DRESSINGS) ×3 IMPLANT
MANIFOLD NEPTUNE II (INSTRUMENTS) ×3 IMPLANT
NDL SPNL 18GX3.5 QUINCKE PK (NEEDLE) ×2 IMPLANT
NEEDLE SPNL 18GX3.5 QUINCKE PK (NEEDLE) ×6 IMPLANT
PACK TOTAL JOINT (CUSTOM PROCEDURE TRAY) ×3 IMPLANT
PADDING CAST COTTON 6X4 STRL (CAST SUPPLIES) ×3 IMPLANT
SEALER BIPOLAR AQUA 6.0 (INSTRUMENTS) IMPLANT
SET HNDPC FAN SPRY TIP SCT (DISPOSABLE) ×1 IMPLANT
SET PAD KNEE POSITIONER (MISCELLANEOUS) ×3 IMPLANT
SUCTION FRAZIER TIP 10 FR DISP (SUCTIONS) ×3 IMPLANT
SUT MNCRL AB 3-0 PS2 27 (SUTURE) ×3 IMPLANT
SUT MON AB 2-0 CT1 36 (SUTURE) ×3 IMPLANT
SUT VIC AB 1 CT1 36 (SUTURE) ×3 IMPLANT
SUT VIC AB 2-0 CT1 27 (SUTURE) ×9
SUT VIC AB 2-0 CT1 TAPERPNT 27 (SUTURE) ×3 IMPLANT
SUT VLOC 180 0 24IN GS25 (SUTURE) ×3 IMPLANT
SYR 50ML LL SCALE MARK (SYRINGE) ×6 IMPLANT
TOWEL OR 17X26 10 PK STRL BLUE (TOWEL DISPOSABLE) ×3 IMPLANT
TOWEL OR NON WOVEN STRL DISP B (DISPOSABLE) IMPLANT
TOWER CARTRIDGE SMART MIX (DISPOSABLE) IMPLANT
WRAP KNEE MAXI GEL POST OP (GAUZE/BANDAGES/DRESSINGS) ×3 IMPLANT

## 2015-10-10 NOTE — H&P (View-Only) (Signed)
TOTAL KNEE ADMISSION H&P  Patient is being admitted for right total knee arthroplasty.  Subjective:  Chief Complaint:right knee pain.  HPI: Samuel Hodge, 64 y.o. male, has a history of pain and functional disability in the right knee due to arthritis and has failed non-surgical conservative treatments for greater than 12 weeks to includeNSAID's and/or analgesics, corticosteriod injections, flexibility and strengthening excercises, use of assistive devices and activity modification.  Onset of symptoms was gradual, starting >10 years ago with gradually worsening course since that time. The patient noted prior procedures on the knee to include  arthroscopy on the right knee(s).  Patient currently rates pain in the right knee(s) at 10 out of 10 with activity. Patient has night pain, worsening of pain with activity and weight bearing, pain that interferes with activities of daily living, crepitus and joint swelling.  Patient has evidence of subchondral cysts, subchondral sclerosis, periarticular osteophytes, joint subluxation and joint space narrowing by imaging studies. There is no active infection.  Patient Active Problem List   Diagnosis Date Noted  . Primary osteoarthritis of right hip 08/19/2015   Past Medical History  Diagnosis Date  . Hypertension   . Arthritis   . History of gout   . History of skin cancer   . GERD (gastroesophageal reflux disease)   . Diabetes mellitus without complication ([place])   . Restless leg syndrome   . Osteoarthritis of both ankles     WEARS BRACES BOTH ANKLES    Past Surgical History  Procedure Laterality Date  . Rotator cuff repair  2013    rt shoulder  . Hand surgery  1973    rt hand  . Hydrocele excision / repair  1986  . Fracture surgery  1968    left hip  . Tonsillectomy    . Total hip arthroplasty Right 08/19/2015    Procedure: RIGHT TOTAL HIP ARTHROPLASTY ANTERIOR APPROACH;  Surgeon: Rod Can, MD;  Location: [facility];  Service:  Orthopedics;  Laterality: Right;     (Not in a hospital admission) Allergies  Allergen Reactions  . Aleve [Naproxen Sodium] Hives  . Aspirin Other (See Comments)    Unknown "childhood allergy"  . Penicillins Other (See Comments)    All pt remembers is nose bleeds associated with this reaction.  Has patient had a PCN reaction causing immediate rash, facial/tongue/throat swelling, SOB or lightheadedness with hypotension: Unknown Has patient had a PCN reaction causing severe rash involving mucus membranes or skin necrosis: Unknown Has patient had a PCN reaction that required hospitalization: Yes Has patient had a PCN reaction occurring within the last 10 years: No If all of the above answers are "NO", then may proceed with Cepha    Social History  Substance Use Topics  . Smoking status: Never Smoker   . Smokeless tobacco: Not on file  . Alcohol Use: No    No family history on file.   Review of Systems  Constitutional: Negative.   HENT: Negative.   Eyes: Negative.   Respiratory: Negative.   Cardiovascular: Negative.   Gastrointestinal: Negative.   Musculoskeletal: Positive for joint pain.  Skin: Negative.   Neurological: Negative.   Endo/Heme/Allergies: Negative.   Psychiatric/Behavioral: Negative.     Objective:  Physical Exam  Constitutional: He is oriented to person, place, and time. He appears well-developed and well-nourished.  HENT:  Head: Normocephalic.  Eyes: Conjunctivae and EOM are normal. Pupils are equal, round, and reactive to light.  Neck: Normal range of motion. Neck supple.  Cardiovascular: Normal rate, regular rhythm, normal heart sounds and intact distal pulses.   Respiratory: Effort normal and breath sounds normal. No respiratory distress.  GI: Soft. Bowel sounds are normal. He exhibits distension.  Genitourinary:  deferred  Musculoskeletal:       Right knee: He exhibits swelling and effusion. Tenderness found. Medial joint line tenderness noted.   Varus deformity ROM 1-120 Healed arthroscopy portals  Neurological: He is alert and oriented to person, place, and time. He has normal reflexes.  Skin: Skin is warm and dry.  Psychiatric: He has a normal mood and affect. His behavior is normal. Judgment and thought content normal.    Vital signs in last 24 hours: @VSRANGES @  Labs:   Estimated body mass index is 31.48 kg/(m^2) as calculated from the following:   Height as of 08/19/15: 5\' 8"  (1.727 m).   Weight as of 08/03/15: 93.895 kg (207 lb).   Imaging Review Plain radiographs demonstrate severe degenerative joint disease of the right knee(s). The overall alignment issignificant varus. The bone quality appears to be adequate for age and reported activity level.  Assessment/Plan:  End stage arthritis, right knee   The patient history, physical examination, clinical judgment of the provider and imaging studies are consistent with end stage degenerative joint disease of the right knee(s) and total knee arthroplasty is deemed medically necessary. The treatment options including medical management, injection therapy arthroscopy and arthroplasty were discussed at length. The risks and benefits of total knee arthroplasty were presented and reviewed. The risks due to aseptic loosening, infection, stiffness, patella tracking problems, thromboembolic complications and other imponderables were discussed. The patient acknowledged the explanation, agreed to proceed with the plan and consent was signed. Patient is being admitted for inpatient treatment for surgery, pain control, PT, OT, prophylactic antibiotics, VTE prophylaxis, progressive ambulation and ADL's and discharge planning. The patient is planning to be discharged home with home health services

## 2015-10-10 NOTE — Progress Notes (Signed)
Orthopedic Tech Progress Note Patient Details:  MARQUIN HAMMER [birth year] [id number]  Patient ID: Renelda Loma, male   DOB: 02-20-1951, 64 y.o.   MRN: [id number]   [place] Pink 10/10/2015, 6:52 PMTrapeze bar

## 2015-10-10 NOTE — Op Note (Signed)
OPERATIVE REPORT  SURGEON: Rod Can, MD.   ASSISTANT: Ky Barban, RNFA.  PREOPERATIVE DIAGNOSIS: Right knee arthritis.   POSTOPERATIVE DIAGNOSIS: Right knee arthritis.   PROCEDURE: Right total knee arthroplasty.   IMPLANTS: Stryker Triathlon CR femur, size 5. Stryker Triathlon Tritanium tibia, size 5. X3 polyethelyene insert, size 11 mm, CR. Tritanium 3 button asymmetric patella, size 38 mm.  ANESTHESIA:  Spinal  TOURNIQUET TIME: not utilized.  ESTIMATED BLOOD LOSS: 250 mL.  ANTIBIOTICS: 900 mg clindamycin.  DRAINS: None.  COMPLICATIONS: None   CONDITION: PACU - hemodynamically stable.   BRIEF CLINICAL NOTE: Samuel Hodge is a 64 y.o. male with a long-standing history of Right knee arthritis. After failing conservative management, the patient was indicated for total knee arthroplasty. The risks, benefits, and alternatives to the procedure were explained, and the patient elected to proceed.  PROCEDURE IN DETAIL: Spinal anesthesia was obtained in the pre-op holding area. Once inside the operative room, a foley catheter was inserted. The patient was then positioned, a nonsterile tourniquet was placed, and the lower extremity was prepped and draped in the normal sterile surgical fashion. A time-out was called verifying side and site of surgery. The patient received IV antibiotics within 60 minutes of beginning the procedure. The tourniquet was not utilized.  An anterior approach to the knee was performed utilizing a midvastus arthrotomy. A medial release was performed and the patellar fat pad was excised. A 5-degree distal femur valgus cut was made with an intramedullary guide. A freehand patellar resection was performed, and the patella was sized an prepared with 3 lug holes.  The proximal tibial resection was performed using an extramedullary guide, leaving a bone island for the PCL. The menisci were excised. A spacer block was placed, and the alignment and  balance in extension were confirmed.   The distal femur was sized using the 3-degree external rotation guide referencing the posterior femoral cortex. The appropriate 4-in-1 cutting block was pinned into place, and the rotation was checked using a spacer block at 90 degrees of flexion. The remaining femoral cuts were performed, taking care to protect the MCL.  The tibia was sized and the trial tray was pinned into place. The remaining trail components were inserted. The knee was stable to varus and valgus stress through a full range of motion. The patella tracked centrally, and the PCL was well balanced. The trial components were removed, and the proximal tibial surface was prepared. Final components were impacted into place. The knee was tested for a final time and found to be well balanced.  The wound was copiously irrigated with a dilute betadine solution followed by normal saline with pulse lavage. Marcaine solution was injected into the periarticular soft tissue. The wound was closed in layers using #1 Vicryl and V-Loc for the fascia, 2-0 Vicryl for the subcutaneous fat, 2-0 Monocryl for the deep dermal layer, 3-0 running Monocryl subcuticular Stitch, and Dermabond for the skin. Once the glue was fully dried, an Aquacell Ag and compressive dressing were applied. The patient was transported to the recovery room in stable ondition. Sponge, needle, and instrument counts were correct at the end of the case x2. The patient tolerated the procedure well and there were no known complications.

## 2015-10-10 NOTE — Anesthesia Postprocedure Evaluation (Signed)
Anesthesia Post Note  Patient: Samuel Hodge  Procedure(s) Performed: Procedure(s) (LRB): TOTAL RIGHT  KNEE ARTHROPLASTY (Right)  Patient location during evaluation: PACU Anesthesia Type: Spinal Level of consciousness: oriented and awake and alert Pain management: pain level controlled Vital Signs Assessment: post-procedure vital signs reviewed and stable Respiratory status: spontaneous breathing, respiratory function stable and patient connected to nasal cannula oxygen Cardiovascular status: blood pressure returned to baseline and stable Postop Assessment: no headache, no backache and spinal receding Anesthetic complications: no    Last Vitals:  Filed Vitals:   10/10/15 1753 10/10/15 2033  BP: 107/68 114/79  Pulse:  89  Temp:  36.7 C  Resp:  14    Last Pain:  Filed Vitals:   10/10/15 2235  PainSc: Asleep    LLE Motor Response: Responds to commands   RLE Motor Response: Responds to commands   L Sensory Level: L5-Outer lower leg, top of foot, great toe R Sensory Level: L5-Outer lower leg, top of foot, great toe  Effie Berkshire

## 2015-10-10 NOTE — Transfer of Care (Signed)
Immediate Anesthesia Transfer of Care Note  Patient: Samuel Hodge  Procedure(s) Performed: Procedure(s): TOTAL RIGHT  KNEE ARTHROPLASTY (Right)  Patient Location: PACU  Anesthesia Type:Spinal  Level of Consciousness: awake, alert  and patient cooperative  Airway & Oxygen Therapy: Patient Spontanous Breathing  Post-op Assessment: Report given to RN, Post -op Vital signs reviewed and stable and Patient moving all extremities  Post vital signs: Reviewed and stable  Last Vitals:  Filed Vitals:   10/10/15 1044 10/10/15 1545  BP: 140/93   Pulse: 86   Temp: 36.9 C 36.4 C  Resp: 16     Complications: No apparent anesthesia complications

## 2015-10-10 NOTE — Anesthesia Procedure Notes (Signed)
Spinal Patient location during procedure: OR Start time: 10/10/2015 12:50 PM End time: 10/10/2015 12:56 PM Staffing Performed by: anesthesiologist  Preanesthetic Checklist Completed: patient identified, site marked, surgical consent, pre-op evaluation, timeout performed, IV checked, risks and benefits discussed and monitors and equipment checked Spinal Block Patient position: right lateral decubitus Prep: Betadine Patient monitoring: heart rate, cardiac monitor, continuous pulse ox and blood pressure Approach: midline Location: L3-4 Injection technique: single-shot Needle Needle type: Tuohy  Needle gauge: 22 G Needle length: 9 cm Additional Notes Multiple passes required, 10 mg 0.75% Bupivacaine injected easily.

## 2015-10-10 NOTE — Anesthesia Preprocedure Evaluation (Addendum)
Anesthesia Evaluation  Patient identified by MRN, date of birth, ID band Patient awake    Reviewed: Allergy & Precautions, NPO status , Patient's Chart, lab work & pertinent test results  Airway Mallampati: II       Dental  (+) Teeth Intact, Dental Advidsory Given   Pulmonary    breath sounds clear to auscultation       Cardiovascular hypertension, On Medications  Rhythm:regular Rate:Normal     Neuro/Psych    GI/Hepatic GERD  Medicated and Controlled,  Endo/Other  diabetes, Type 2, Oral Hypoglycemic Agents  Renal/GU      Musculoskeletal   Abdominal   Peds  Hematology   Anesthesia Other Findings   Reproductive/Obstetrics                            Anesthesia Physical Anesthesia Plan  ASA: III  Anesthesia Plan: MAC and Spinal   Post-op Pain Management:    Induction:   Airway Management Planned: Simple Face Mask and Natural Airway  Additional Equipment:   Intra-op Plan:   Post-operative Plan:   Informed Consent: I have reviewed the patients History and Physical, chart, labs and discussed the procedure including the risks, benefits and alternatives for the proposed anesthesia with the patient or authorized representative who has indicated his/her understanding and acceptance.   Dental Advisory Given and Dental advisory given  Plan Discussed with: CRNA and Anesthesiologist  Anesthesia Plan Comments:        Anesthesia Quick Evaluation

## 2015-10-10 NOTE — Interval H&P Note (Signed)
History and Physical Interval Note:  10/10/2015 12:18 PM  Samuel Hodge  has presented today for surgery, with the diagnosis of RIGHT KNEE OA   The various methods of treatment have been discussed with the patient and family. After consideration of risks, benefits and other options for treatment, the patient has consented to  Procedure(s): TOTAL RIGHT  KNEE ARTHROPLASTY (Right) as a surgical intervention .  The patient's history has been reviewed, patient examined, no change in status, stable for surgery.  I have reviewed the patient's chart and labs.  Questions were answered to the patient's satisfaction.     Lunell Robart, Horald Pollen

## 2015-10-11 ENCOUNTER — Encounter (HOSPITAL_COMMUNITY): Payer: Self-pay | Admitting: Orthopedic Surgery

## 2015-10-11 LAB — BASIC METABOLIC PANEL
Anion gap: 9 (ref 5–15)
BUN: 16 mg/dL (ref 6–20)
CO2: 23 mmol/L (ref 22–32)
Calcium: 8.6 mg/dL — ABNORMAL LOW (ref 8.9–10.3)
Chloride: 101 mmol/L (ref 101–111)
Creatinine, Ser: 0.86 mg/dL (ref 0.61–1.24)
GFR calc Af Amer: >60 mL/min (ref >60)
GFR calc non Af Amer: >60 mL/min (ref >60)
Glucose, Bld: 155 mg/dL — ABNORMAL HIGH (ref 65–99)
Potassium: 4.7 mmol/L (ref 3.5–5.1)
Sodium: 133 mmol/L — ABNORMAL LOW (ref 135–145)

## 2015-10-11 LAB — GLUCOSE, CAPILLARY
Glucose-Capillary: 221 mg/dL — ABNORMAL HIGH (ref 65–99)
Glucose-Capillary: 150 mg/dL — ABNORMAL HIGH (ref 65–99)
Glucose-Capillary: 146 mg/dL — ABNORMAL HIGH (ref 65–99)
Glucose-Capillary: 232 mg/dL — ABNORMAL HIGH (ref 65–99)

## 2015-10-11 LAB — CBC
HCT: 34.9 % — ABNORMAL LOW (ref 39.0–52.0)
Hemoglobin: 11.9 g/dL — ABNORMAL LOW (ref 13.0–17.0)
MCH: 28.7 pg (ref 26.0–34.0)
MCHC: 34.1 g/dL (ref 30.0–36.0)
MCV: 84.1 fL (ref 78.0–100.0)
Platelets: 195 10*3/uL (ref 150–400)
RBC: 4.15 MIL/uL — ABNORMAL LOW (ref 4.22–5.81)
RDW: 12.8 % (ref 11.5–15.5)
WBC: 9 10*3/uL (ref 4.0–10.5)

## 2015-10-11 MED ORDER — SENNA 8.6 MG PO TABS: 2.0000 | ORAL_TABLET | Freq: Every day | ORAL | Status: DC

## 2015-10-11 MED ORDER — ONDANSETRON HCL 4 MG PO TABS: 4.0000 mg | ORAL_TABLET | Freq: Four times a day (QID) | ORAL | Status: DC | PRN

## 2015-10-11 MED ORDER — APIXABAN 2.5 MG PO TABS: 2.5000 mg | ORAL_TABLET | Freq: Two times a day (BID) | ORAL | Status: DC

## 2015-10-11 MED ORDER — HYDROCODONE-ACETAMINOPHEN 5-325 MG PO TABS: 1.0000 | ORAL_TABLET | ORAL | Status: DC | PRN

## 2015-10-11 MED ORDER — OXYCODONE HCL ER 10 MG PO T12A: 10.0000 mg | EXTENDED_RELEASE_TABLET | ORAL | Status: DC

## 2015-10-11 NOTE — Discharge Summary (Signed)
Physician Discharge Summary  Patient ID: Samuel Hodge MRN: [id number] DOB/AGE: 02-16-1951 64 y.o.  Admit date: 10/10/2015 Discharge date: 10/11/2015  Admission Diagnoses:  Primary osteoarthritis of right knee  Discharge Diagnoses:  Principal Problem:   Primary osteoarthritis of right knee   Past Medical History  Diagnosis Date  . History of skin cancer   . Pneumonia     hx of-many yrs ago  . Joint pain   . Joint swelling   . Arthritis   . Osteoarthritis of both ankles     WEARS BRACES BOTH ANKLES  . GERD (gastroesophageal reflux disease)     takes Pantoprazole daily  . History of colon polyps     benign  . Nocturia   . Restless leg     takes Ropinirole daily  . Diabetes mellitus without complication ([place])     takes Metformin and Glimepiride daily  . Hypertension     takes Lisinopril,Amlodipine,HCTZ  daily    Surgeries: Procedure(s): TOTAL RIGHT  KNEE ARTHROPLASTY on 10/10/2015   Consultants (if any):    Discharged Condition: Improved  Hospital Course: Samuel Hodge is an 64 y.o. male who was admitted 10/10/2015 with a diagnosis of Primary osteoarthritis of right knee and went to the operating room on 10/10/2015 and underwent the above named procedures.    He was given perioperative antibiotics:      Anti-infectives    Start     Dose/Rate Route Frequency Ordered Stop   10/10/15 1800  clindamycin (CLEOCIN) IVPB 600 mg     600 mg 100 mL/hr over 30 Minutes Intravenous Every 6 hours 10/10/15 1659 10/11/15 0025   10/10/15 1200  clindamycin (CLEOCIN) IVPB 900 mg     900 mg 100 mL/hr over 30 Minutes Intravenous To [facility] 10/07/15 1244 10/10/15 1212    .  He was given sequential compression devices, early ambulation, and apixaban for DVT prophylaxis.  He benefited maximally from the hospital stay and there were no complications.    Recent vital signs:  Filed Vitals:   10/11/15 1011 10/11/15 1300  BP: 105/50 101/65  Pulse: 79 76  Temp: 97.5  F (36.4 C) 97.5 F (36.4 C)  Resp: 18 18    Recent laboratory studies:  Lab Results  Component Value Date   HGB 11.9* 10/11/2015   HGB 14.6 10/03/2015   HGB 12.5* 08/20/2015   Lab Results  Component Value Date   WBC 9.0 10/11/2015   PLT 195 10/11/2015   Lab Results  Component Value Date   INR 1.00 10/03/2015   Lab Results  Component Value Date   NA 133* 10/11/2015   K 4.7 10/11/2015   CL 101 10/11/2015   CO2 23 10/11/2015   BUN 16 10/11/2015   CREATININE 0.86 10/11/2015   GLUCOSE 155* 10/11/2015    Discharge Medications:     Medication List    STOP taking these medications        cyclobenzaprine 10 MG tablet  Commonly known as:  FLEXERIL     meloxicam 15 MG tablet  Commonly known as:  MOBIC      TAKE these medications        amLODipine 5 MG tablet  Commonly known as:  NORVASC  Take 5 mg by mouth daily.     apixaban 2.5 MG Tabs tablet  Commonly known as:  ELIQUIS  Take 1 tablet (2.5 mg total) by mouth every 12 (twelve) hours.     docusate sodium 100 MG capsule  Commonly known as:  COLACE  Take 1 capsule (100 mg total) by mouth 2 (two) times daily.     gabapentin 600 MG tablet  Commonly known as:  NEURONTIN  Take 1,200 mg by mouth 2 (two) times daily.     glimepiride 1 MG tablet  Commonly known as:  AMARYL  Take 1 mg by mouth daily with breakfast.     hydrochlorothiazide 25 MG tablet  Commonly known as:  HYDRODIURIL  Take 25 mg by mouth daily.     HYDROcodone-acetaminophen 5-325 MG tablet  Commonly known as:  NORCO  Take 1-2 tablets by mouth every 4 (four) hours as needed for moderate pain.     lisinopril 20 MG tablet  Commonly known as:  PRINIVIL,ZESTRIL  Take 30 mg by mouth daily.     metFORMIN 500 MG 24 hr tablet  Commonly known as:  GLUCOPHAGE-XR  Take 1,000 mg by mouth 2 (two) times daily.     ondansetron 4 MG tablet  Commonly known as:  ZOFRAN  Take 1 tablet (4 mg total) by mouth every 6 (six) hours as needed for nausea.      oxyCODONE 10 mg 12 hr tablet  Commonly known as:  OXYCONTIN  Take 1 tablet (10 mg total) by mouth PRO. 1 tab PO every 12 hours for 3 days, then 1 tab PO daily for 4 days     pantoprazole 40 MG tablet  Commonly known as:  PROTONIX  Take 40 mg by mouth daily.     rOPINIRole 8 MG 24 hr tablet  Commonly known as:  REQUIP XL  Take 8 mg by mouth at bedtime.     senna 8.6 MG Tabs tablet  Commonly known as:  SENOKOT  Take 2 tablets (17.2 mg total) by mouth at bedtime.        Diagnostic Studies: Dg Knee 1-2 Views Right  10/10/2015  CLINICAL DATA:  Postop total knee replacement EXAM: RIGHT KNEE - 1-2 VIEW COMPARISON:  None. FINDINGS: The right knee demonstrates a total knee arthroplasty without evidence of hardware failure complication. There is no significant joint effusion. There is no fracture or dislocation. The alignment is anatomic. Post-surgical changes noted in the surrounding soft tissues. IMPRESSION: Right total knee arthroplasty. Electronically Signed   By: Kathreen Devoid   On: 10/10/2015 16:16    Disposition: 01-Home or Self Care  Discharge Instructions    Call MD / Call 911    Complete by:  As directed   If you experience chest pain or shortness of breath, CALL 911 and be transported to the hospital emergency room.  If you develope a fever above 101 F, pus (white drainage) or increased drainage or redness at the wound, or calf pain, call your surgeon's office.     Constipation Prevention    Complete by:  As directed   Drink plenty of fluids.  Prune juice may be helpful.  You may use a stool softener, such as Colace (over the counter) 100 mg twice a day.  Use MiraLax (over the counter) for constipation as needed.     Diet - low sodium heart healthy    Complete by:  As directed      Do not put a pillow under the knee. Place it under the heel.    Complete by:  As directed      Driving restrictions    Complete by:  As directed   No driving for 6 weeks     Increase activity  slowly  as tolerated    Complete by:  As directed      Lifting restrictions    Complete by:  As directed   No lifting for 6 weeks     TED hose    Complete by:  As directed   Use stockings (TED hose) for 2 weeks on both leg(s).  You may remove them at night for sleeping.           Follow-up Information    Follow up with Cleola Perryman, Horald Pollen, MD. Schedule an appointment as soon as possible for a visit in 2 weeks.   Specialty:  Orthopedic Surgery   Why:  For wound re-check   Contact information:   [place] [place] [address] [phone]       Follow up with [place]   Why:  Someone from [facility] will contact you concerning start date and time for therapy.   Contact information:   [address] [place] [address] [phone]        Signed: Kerim, Matzke 10/11/2015, 5:49 PM

## 2015-10-11 NOTE — Care Management (Signed)
Utilization review completed. Reyhan Moronta, RN Case Manager [phone]. 

## 2015-10-11 NOTE — Progress Notes (Signed)
Pt being discharged home via wheelchair with family. Pt alert and oriented x4. VSS. Pt c/o no pain at this time. No signs of respiratory distress. Education complete and care plans resolved. IV removed with catheter intact and pt tolerated well. Incision CDI. No further issues at this time. Pt to follow up with PCP. Leanne Chang, RN

## 2015-10-11 NOTE — Evaluation (Signed)
Physical Therapy Evaluation Patient Details Name: Samuel Hodge MRN: [id number] DOB: Oct 19, 1951 Today's Date: 10/11/2015   History of Present Illness  64 y.o. male admitted to [facility] on 10/10/15 for elective R TKA.  Pt with significant PMHx of DM, HTN, R RTC, L hip fx surgery, R THA.  Clinical Impression  Pt is POD #1 and is moving well, min guard assist with RW into the hallway.  He would like to go home today if he can.  PT will attempt stairs this PM and re-assess readiness.   PT to follow acutely for deficits listed below.       Follow Up Recommendations Home health PT;Supervision for mobility/OOB    Equipment Recommendations  None recommended by PT    Recommendations for Other Services   NA    Precautions / Restrictions Precautions Precautions: Knee Precaution Booklet Issued: Yes (comment) Precaution Comments: knee handout given and reviewed Restrictions Weight Bearing Restrictions: Yes RLE Weight Bearing: Weight bearing as tolerated      Mobility  Bed Mobility Overal bed mobility: Needs Assistance Bed Mobility: Supine to Sit     Supine to sit: Min guard     General bed mobility comments: Min guard assist to help progress right leg over EOB.  Pt using trapeeze and bedrails for leverage at trunk.   Transfers Overall transfer level: Needs assistance Equipment used: Rolling walker (2 wheeled) Transfers: Sit to/from Stand Sit to Stand: Min guard         General transfer comment: Min guard assist for safety due to first time up and mild reports of lightheadedness  Ambulation/Gait Ambulation/Gait assistance: Min guard Ambulation Distance (Feet): 65 Feet Assistive device: Rolling walker (2 wheeled) Gait Pattern/deviations: Step-through pattern;Antalgic     General Gait Details: left leg externally rotated and ankle very supinated during gait.  Pt reports he has walked that way since he was 66 and broke his hip playing basketball.  Pt with mildly antalgiv gait  pattern on the right.  Verbal cues for LE seqencing and RW use.       Balance Overall balance assessment: Needs assistance Sitting-balance support: Feet supported;No upper extremity supported Sitting balance-Leahy Scale: Good     Standing balance support: Bilateral upper extremity supported Standing balance-Leahy Scale: Fair                               Pertinent Vitals/Pain Pain Assessment: 0-10 Pain Score: 2  Pain Location: right knee Pain Descriptors / Indicators: Sore Pain Intervention(s): Limited activity within patient's tolerance;Monitored during session;Repositioned    Home Living Family/patient expects to be discharged to:: Private residence Living Arrangements: Spouse/significant other Available Help at Discharge: Family;Available 24 hours/day Type of Home: House Home Access: Stairs to enter Entrance Stairs-Rails: Psychiatric nurse of Steps: 4 Home Layout: One level Home Equipment: Walker - 2 wheels;Bedside commode;Shower seat;Cane - single point      Prior Function Level of Independence: Independent               Hand Dominance   Dominant Hand: Right    Extremity/Trunk Assessment   Upper Extremity Assessment: Defer to OT evaluation           Lower Extremity Assessment: RLE deficits/detail RLE Deficits / Details: right leg with normal post op pain and weakness.  Ankle 3/5, knee 2+/5, hip 2+/5    Cervical / Trunk Assessment: Normal  Communication   Communication: No difficulties  Cognition Arousal/Alertness:  Awake/alert Behavior During Therapy: WFL for tasks assessed/performed Overall Cognitive Status: Within Functional Limits for tasks assessed                         Exercises Total Joint Exercises Ankle Circles/Pumps: AROM;Both;10 reps;Seated Quad Sets: AROM;Right;10 reps;Seated Towel Squeeze: AROM;Both;10 reps;Seated Heel Slides: AAROM;Both;10 reps;Supine Straight Leg Raises: AAROM;Right;10  reps;Seated Goniometric ROM: 8-85 AROM supine in the chair      Assessment/Plan    PT Assessment Patient needs continued PT services  PT Diagnosis Difficulty walking;Abnormality of gait;Generalized weakness;Acute pain   PT Problem List Decreased strength;Decreased range of motion;Decreased activity tolerance;Decreased balance;Decreased mobility;Decreased knowledge of use of DME;Decreased knowledge of precautions;Pain  PT Treatment Interventions DME instruction;Gait training;Stair training;Functional mobility training;Therapeutic activities;Therapeutic exercise;Balance training;Neuromuscular re-education;Patient/family education;Manual techniques;Modalities   PT Goals (Current goals can be found in the Care Plan section) Acute Rehab PT Goals Patient Stated Goal: to go home today if he can PT Goal Formulation: With patient/family Time For Goal Achievement: 10/18/15 Potential to Achieve Goals: Good    Frequency 7X/week           End of Session Equipment Utilized During Treatment: Gait belt Activity Tolerance: Patient limited by pain Patient left: in chair;with call bell/phone within reach;with family/visitor present           Time: [id number] PT Time Calculation (min) (ACUTE ONLY): 24 min   Charges:   PT Evaluation $Initial PT Evaluation Tier I: 1 Procedure PT Treatments $Gait Training: 8-22 mins        Nilza Eaker B. Artez Regis, [place], DPT [phone]   10/11/2015, 11:19 AM

## 2015-10-11 NOTE — Progress Notes (Signed)
   Subjective:  Patient reports pain as mild to moderate.  No c/o. Denies N/V/CP/SOB.  Objective:   VITALS:   Filed Vitals:   10/10/15 1753 10/10/15 2033 10/10/15 2358 10/11/15 0555  BP: 107/68 114/79 111/64 106/65  Pulse:  89 82 71  Temp:  98.1 F (36.7 C)  97.9 F (36.6 C)  TempSrc:  Oral  Oral  Resp:  14  18  Height:      Weight:      SpO2:  97% 97% 97%    ABD soft Sensation intact distally Intact pulses distally Dorsiflexion/Plantar flexion intact Incision: dressing C/D/I Compartment soft   Lab Results  Component Value Date   WBC 9.0 10/11/2015   HGB 11.9* 10/11/2015   HCT 34.9* 10/11/2015   MCV 84.1 10/11/2015   PLT 195 10/11/2015   BMET    Component Value Date/Time   NA 133* 10/11/2015 0338   K 4.7 10/11/2015 0338   CL 101 10/11/2015 0338   CO2 23 10/11/2015 0338   GLUCOSE 155* 10/11/2015 0338   BUN 16 10/11/2015 0338   CREATININE 0.86 10/11/2015 0338   CALCIUM 8.6* 10/11/2015 0338   GFRNONAA >60 10/11/2015 0338   GFRAA >60 10/11/2015 0338     Assessment/Plan: 1 Day Post-Op   Principal Problem:   Primary osteoarthritis of right knee   WBAT with walker DVT ppx: apixaban, TEDs, foot pumps PO pain control PT/OT Glucose control D/C after clears therapy, likely tomorrow    Samuel Hodge, Samuel Hodge 10/11/2015, 7:37 AM   Rod Can, MD Cell [phone]

## 2015-10-11 NOTE — Evaluation (Signed)
Occupational Therapy Evaluation Patient Details Name: Samuel Hodge MRN: [id number] DOB: 1951/04/12 Today's Date: 10/11/2015    History of Present Illness 64 y.o. male admitted to [facility] on 10/10/15 for elective R TKA.  Pt with significant PMHx of DM, HTN, R RTC, L hip fx surgery, R THA.   Clinical Impression   Pt reports he was independent with ADLs PTA. Currently pt is overall min guard for functional mobility and ADLs with the exception of min assist for LB ADLs. All education completed; pt and wife with no further questions or concerns for OT at this time. Pt planning to d/c home with 24/7 supervision from his wife. Pt ready to d/c from an OT standpoint; signing off at this time. Thank you for this referral.     Follow Up Recommendations  No OT follow up;Supervision - Intermittent    Equipment Recommendations  None recommended by OT    Recommendations for Other Services       Precautions / Restrictions Precautions Precautions: Knee Precaution Booklet Issued: Yes (comment) Precaution Comments: knee handout given and reviewed Restrictions Weight Bearing Restrictions: Yes RLE Weight Bearing: Weight bearing as tolerated      Mobility Bed Mobility Overal bed mobility: Needs Assistance Bed Mobility: Supine to Sit     Supine to sit: Min guard     General bed mobility comments: Pt OOB in chair  Transfers Overall transfer level: Needs assistance Equipment used: Rolling walker (2 wheeled) Transfers: Sit to/from Stand Sit to Stand: Min guard         General transfer comment: Min guard for safety. Sit to stand from chair x 1. Good hand placement and technique.    Balance Overall balance assessment: Needs assistance Sitting-balance support: Feet supported Sitting balance-Leahy Scale: Good     Standing balance support: Bilateral upper extremity supported Standing balance-Leahy Scale: Fair Standing balance comment: RW for support                             ADL Overall ADL's : Needs assistance/impaired Eating/Feeding: Set up;Sitting   Grooming: Min guard;Standing       Lower Body Bathing: Minimal assistance;Sit to/from stand       Lower Body Dressing: Minimal assistance;Sit to/from stand Lower Body Dressing Details (indicate cue type and reason): Educated on compensatory strategies for LB ADLs; pt verbalized understanding. Wife reports she is able to assist with LB ADLs as needed. Toilet Transfer: Min guard;Ambulation;BSC;RW (BSC over toilet)   Toileting- Clothing Manipulation and Hygiene: Min guard;Sit to/from Nurse, children's Details (indicate cue type and reason): Educated on tub transfer technique. Suggested pt perform a dry run of tub transfer with home health therapist prior to attempting on own; pt and wife verbalize understanding. Functional mobility during ADLs: Min guard;Rolling walker General ADL Comments: Wife present for OT eval. Educated on home safety, need for close supervision during ADLs and mobility, edema management techniques; pt and wife verbalize understanding.     Vision     Perception     Praxis      Pertinent Vitals/Pain Pain Assessment: Faces Pain Score: 2  Faces Pain Scale: Hurts a little bit Pain Location: R knee Pain Descriptors / Indicators: Sore Pain Intervention(s): Limited activity within patient's tolerance;Monitored during session;Repositioned;Ice applied     Hand Dominance Right   Extremity/Trunk Assessment Upper Extremity Assessment Upper Extremity Assessment: Overall WFL for tasks assessed   Lower Extremity Assessment Lower Extremity  Assessment: Defer to PT evaluation RLE Deficits / Details: right leg with normal post op pain and weakness.  Ankle 3/5, knee 2+/5, hip 2+/5   Cervical / Trunk Assessment Cervical / Trunk Assessment: Normal   Communication Communication Communication: No difficulties   Cognition Arousal/Alertness: Awake/alert Behavior During  Therapy: WFL for tasks assessed/performed Overall Cognitive Status: Within Functional Limits for tasks assessed                     General Comments       Exercises Exercises: Total Joint     Shoulder Instructions      Home Living Family/patient expects to be discharged to:: Private residence Living Arrangements: Spouse/significant other Available Help at Discharge: Family;Available 24 hours/day Type of Home: House Home Access: Stairs to enter [employer] of Steps: 4 Entrance Stairs-Rails: Right;Left Home Layout: One level     Bathroom Shower/Tub: Tub/shower unit;Curtain Shower/tub characteristics: Architectural technologist: Standard Bathroom Accessibility: Yes How Accessible: Accessible via walker Home Equipment: [place] - 2 wheels;Bedside commode;Shower seat;Cane - single point          Prior Functioning/Environment Level of Independence: Independent             OT Diagnosis: Acute pain   OT Problem List:     OT Treatment/Interventions:      OT Goals(Current goals can be found in the care plan section) Acute Rehab OT Goals Patient Stated Goal: to go home today if he can OT Goal Formulation: With patient/family  OT Frequency:     Barriers to D/C:            Co-evaluation              End of Session Equipment Utilized During Treatment: Gait belt;Rolling walker  Activity Tolerance: Patient tolerated treatment well Patient left: in chair;with call bell/phone within reach;with family/visitor present   Time: [id number] OT Time Calculation (min): 14 min Charges:  OT General Charges $OT Visit: 1 Procedure OT Evaluation $Initial OT Evaluation Tier I: 1 Procedure G-Codes:     Binnie Kand M.S., OTR/L [id number]  10/11/2015, 2:20 PM

## 2015-10-11 NOTE — Discharge Instructions (Signed)
°Dr. Brian Swinteck °Total Joint Specialist °[facility] °[address] °[place], [place] [address] °[phone] ° °TOTAL KNEE REPLACEMENT POSTOPERATIVE DIRECTIONS ° ° ° °Knee Rehabilitation, Guidelines Following Surgery  °Results after knee surgery are often greatly improved when you follow the exercise, range of motion and muscle strengthening exercises prescribed by your doctor. Safety measures are also important to protect the knee from further injury. Any time any of these exercises cause you to have increased pain or swelling in your knee joint, decrease the amount until you are comfortable again and slowly increase them. If you have problems or questions, call your caregiver or physical therapist for advice.  ° °WEIGHT BEARING °Weight bearing as tolerated with assist device (walker, cane, etc) as directed, use it as long as suggested by your surgeon or therapist, typically at least 4-6 weeks. ° °HOME CARE INSTRUCTIONS  °Remove items at home which could result in a fall. This includes throw rugs or furniture in walking pathways.  °Continue medications as instructed at time of discharge. °You may have some home medications which will be placed on hold until you complete the course of blood thinner medication.  °You may start showering once you are discharged home but do not submerge the incision under water. Just pat the incision dry and apply a dry gauze dressing on daily. °Walk with walker as instructed.  °You may resume a sexual relationship in one month or when given the OK by your doctor.  °· Use walker as long as suggested by your caregivers. °· Avoid periods of inactivity such as sitting longer than an hour when not asleep. This helps prevent blood clots.  °You may put full weight on your legs and walk as much as is comfortable.  °You may return to work once you are cleared by your doctor.  °Do not drive a car for 6 weeks or until released by you surgeon.  °· Do not drive while  taking narcotics.  °Wear the elastic stockings for three weeks following surgery during the day but you may remove then at night. °Make sure you keep all of your appointments after your operation with all of your doctors and caregivers. You should call the office at the above phone number and make an appointment for approximately two weeks after the date of your surgery. °Do not remove your surgical dressing. The dressing is waterproof; you may take showers in 3 days, but do not take tub baths or submerge the dressing. °Please pick up a stool softener and laxative for home use as long as you are requiring pain medications. °· ICE to the affected knee every three hours for 30 minutes at a time and then as needed for pain and swelling.  Continue to use ice on the knee for pain and swelling from surgery. You may notice swelling that will progress down to the foot and ankle.  This is normal after surgery.  Elevate the leg when you are not up walking on it.   °It is important for you to complete the blood thinner medication as prescribed by your doctor. °· Continue to use the breathing machine which will help keep your temperature down.  It is common for your temperature to cycle up and down following surgery, especially at night when you are not up moving around and exerting yourself.  The breathing machine keeps your lungs expanded and your temperature down. ° °RANGE OF MOTION AND STRENGTHENING EXERCISES  °Rehabilitation of the knee is important following a   knee injury or an operation. After just a few days of immobilization, the muscles of the thigh which control the knee become weakened and shrink (atrophy). Knee exercises are designed to build up the tone and strength of the thigh muscles and to improve knee motion. Often times heat used for twenty to thirty minutes before working out will loosen up your tissues and help with improving the range of motion but do not use heat for the first two weeks following  surgery. These exercises can be done on a training (exercise) mat, on the floor, on a table or on a bed. Use what ever works the best and is most comfortable for you Knee exercises include:  Leg Lifts - While your knee is still immobilized in a splint or cast, you can do straight leg raises. Lift the leg to 60 degrees, hold for 3 sec, and slowly lower the leg. Repeat 10-20 times 2-3 times daily. Perform this exercise against resistance later as your knee gets better.  Quad and Hamstring Sets - Tighten up the muscle on the front of the thigh (Quad) and hold for 5-10 sec. Repeat this 10-20 times hourly. Hamstring sets are done by pushing the foot backward against an object and holding for 5-10 sec. Repeat as with quad sets.  A rehabilitation program following serious knee injuries can speed recovery and prevent re-injury in the future due to weakened muscles. Contact your doctor or a physical therapist for more information on knee rehabilitation.   SKILLED REHAB INSTRUCTIONS: If the patient is transferred to a skilled rehab facility following release from the hospital, a list of the current medications will be sent to the facility for the patient to continue.  When discharged from the skilled rehab facility, please have the facility set up the patient's [place] prior to being released. Also, the skilled facility will be responsible for providing the patient with their medications at time of release from the facility to include their pain medication, the muscle relaxants, and their blood thinner medication. If the patient is still at the rehab facility at time of the two week follow up appointment, the skilled rehab facility will also need to assist the patient in arranging follow up appointment in our office and any transportation needs.  MAKE SURE YOU:  Understand these instructions.  Will watch your condition.  Will get help right away if you are not doing well or get worse.     Pick up stool softner and laxative for home use following surgery while on pain medications. Do NOT remove your dressing. You may shower.  Do not take tub baths or submerge incision under water. May shower starting three days after surgery. Please use a clean towel to pat the incision dry following showers. Continue to use ice for pain and swelling after surgery. Do not use any lotions or creams on the incision until instructed by your surgeon.   Information on my medicine - ELIQUIS (apixaban)  This medication education was reviewed with me or my healthcare representative as part of my discharge preparation.  The pharmacist that spoke with me during my hospital stay was:  Lawson Radar, [facility]  Why was Eliquis prescribed for you? Eliquis was prescribed for you to reduce the risk of forming blood clots that can cause a stroke if you have a medical condition called atrial fibrillation (a type of irregular heartbeat) OR to reduce the risk of a blood clots forming after orthopedic surgery.  What do  You need to know about Eliquis ? Take your Eliquis TWICE DAILY - one tablet in the morning and one tablet in the evening with or without food.  It would be best to take the doses about the same time each day.  If you have difficulty swallowing the tablet whole please discuss with your pharmacist how to take the medication safely.  Take Eliquis exactly as prescribed by your doctor and DO NOT stop taking Eliquis without talking to the doctor who prescribed the medication.  Stopping may increase your risk of developing a new clot or stroke.  Refill your prescription before you run out.  After discharge, you should have regular check-up appointments with your healthcare provider that is prescribing your Eliquis.  In the future your dose may need to be changed if your kidney function or weight changes by a significant amount or as you get older.  What do you do if you miss a dose? If  you miss a dose, take it as soon as you remember on the same day and resume taking twice daily.  Do not take more than one dose of ELIQUIS at the same time.  Important Safety Information A possible side effect of Eliquis is bleeding. You should call your healthcare provider right away if you experience any of the following: ? Bleeding from an injury or your nose that does not stop. ? Unusual colored urine (red or dark brown) or unusual colored stools (red or black). ? Unusual bruising for unknown reasons. ? A serious fall or if you hit your head (even if there is no bleeding).  Some medicines may interact with Eliquis and might increase your risk of bleeding or clotting while on Eliquis. To help avoid this, consult your healthcare provider or pharmacist prior to using any new prescription or non-prescription medications, including herbals, vitamins, non-steroidal anti-inflammatory drugs (NSAIDs) and supplements.  This website has more information on Eliquis (apixaban): [email or web address].no.

## 2015-10-11 NOTE — Care Management Note (Signed)
Case Management Note  Patient Details  Name: [name] MRN: [id number] Date of Birth: Apr 10, 1951  Subjective/Objective:   64 yr old male s/p right total knee arthroplasty.                 Action/Plan:  Case manager spoke with patient and wife concerning home health and DME needs. Choice was offered.  Patient was preoperatively setup with [facility], but wants to use [facility]. [facility] Liaison is aware. Case manager contacted Tillie Rung with [facility] @ [phone] with referral. Demographics,orders, OP note, PT eval were faxed to her at [phone].  Patient states he has a rolling walker and a 3in1 from previous surgery. He has family support at discharge.     Expected Discharge Date:    10/11/15              Expected Discharge Plan:   Home with Home Health  In-House Referral:  NA  Discharge planning Services  CM Consult  Post Acute Care Choice:  Home Health Choice offered to:  Patient, Spouse  DME Arranged:  N/A DME Agency:  NA  HH Arranged:  PT HH Agency:  [facility]  Status of Service:  Completed, signed off  Medicare Important Message Given:    Date Medicare IM Given:    Medicare IM give by:    Date Additional Medicare IM Given:    Additional Medicare Important Message give by:     If discussed at Nikya Busler of Stay Meetings, dates discussed:    Additional Comments:  Ninfa Meeker, RN 10/11/2015, 2:32 PM

## 2015-10-11 NOTE — Progress Notes (Signed)
Physical Therapy Treatment Patient Details Name: Samuel Hodge MRN: [id number] DOB: Jan 10, 1951 Today's Date: 10/11/2015    History of Present Illness 64 y.o. male admitted to [facility] on 10/10/15 for elective R TKA.  Pt with significant PMHx of DM, HTN, R RTC, L hip fx surgery, R THA.    PT Comments    Pt performed well during afternoon treatment today. Pt was able to perform stair training safely in preparation for discharging to home. Spoke to nurse about getting paper work/talking to the doctor to set up discharging home today.   Follow Up Recommendations  Home health PT;Supervision for mobility/OOB     Equipment Recommendations  None recommended by PT       Precautions / Restrictions Precautions Precautions: Knee Restrictions Weight Bearing Restrictions: Yes RLE Weight Bearing: Weight bearing as tolerated    Mobility         Transfers Overall transfer level: Needs assistance Equipment used: Rolling walker (2 wheeled) Transfers: Sit to/from Stand Sit to Stand: Supervision         General transfer comment: Pt required supervision with standing from recliner. SPTA blocked RW for safety.   Ambulation/Gait Ambulation/Gait assistance: Supervision Ambulation Distance (Feet): 250 Feet Assistive device: Rolling walker (2 wheeled) Gait Pattern/deviations: Step-through pattern;Antalgic Gait velocity: decreased Gait velocity interpretation: <1.8 ft/sec, indicative of risk for recurrent falls General Gait Details: Pt required supervision during gait training to maintain safety.    Stairs Stairs: Yes Stairs assistance: Min assist Stair Management: One rail Left Number of Stairs: 5 (2 steps ascending 3 steps descending x 2 trials) General stair comments: Pt required minimal hand held assistance on RUE to maintain balance while ascending and descending stairs. Wife was able to safely guard and provide handheld assistance during stair training to prepare for home.          Balance Overall balance assessment: Needs assistance Sitting-balance support: Feet supported Sitting balance-Leahy Scale: Good     Standing balance support: Bilateral upper extremity supported Standing balance-Leahy Scale: Fair Standing balance comment: RW for support                    Cognition Arousal/Alertness: Awake/alert Behavior During Therapy: WFL for tasks assessed/performed Overall Cognitive Status: Within Functional Limits for tasks assessed                      Exercises Total Joint Exercises Heel Slides: AROM;Right;10 reps;Seated (hold for 5 seconds) Long Arc Quad: AROM;Right;10 reps;Seated Knee Flexion: AROM;AAROM;10 reps;Seated (LLE ontop of RLE to help assist with furthering knee flexion)        Pertinent Vitals/Pain Pain Assessment: 0-10 Pain Score: 4  Faces Pain Scale: Hurts a little bit Pain Location: right knee Pain Descriptors / Indicators: Tightness;Sore Pain Intervention(s): Ice applied;Monitored during session    Home Living Family/patient expects to be discharged to:: Private residence Living Arrangements: Spouse/significant other Available Help at Discharge: Family;Available 24 hours/day Type of Home: House Home Access: Stairs to enter Entrance Stairs-Rails: Right;Left Home Layout: One level Home Equipment: Environmental consultant - 2 wheels;Bedside commode;Shower seat;Cane - single point      Prior Function Level of Independence: Independent          PT Goals (current goals can now be found in the care plan section) Acute Rehab PT Goals Patient Stated Goal: to go home today if he can Progress towards PT goals: Progressing toward goals    Frequency  7X/week    PT Plan Current plan remains  appropriate       End of Session Equipment Utilized During Treatment: Gait belt Activity Tolerance: Patient tolerated treatment well Patient left: in chair;with call bell/phone within reach;with family/visitor present     Time:  1441-1526 (Deducted 15 minutes due to nurse taking out IV.) PT Time Calculation (min) (ACUTE ONLY): 45 min  Charges:    2 Gait           Sundra Aland, [place] OFFICE           10/11/2015, 3:35 PM

## 2015-11-15 DIAGNOSIS — M25661 Stiffness of right knee, not elsewhere classified: Secondary | ICD-10-CM | POA: Diagnosis not present

## 2015-11-15 DIAGNOSIS — M25561 Pain in right knee: Secondary | ICD-10-CM | POA: Diagnosis not present

## 2015-11-16 DIAGNOSIS — M25561 Pain in right knee: Secondary | ICD-10-CM | POA: Diagnosis not present

## 2015-11-16 DIAGNOSIS — M25661 Stiffness of right knee, not elsewhere classified: Secondary | ICD-10-CM | POA: Diagnosis not present

## 2015-11-18 DIAGNOSIS — M25661 Stiffness of right knee, not elsewhere classified: Secondary | ICD-10-CM | POA: Diagnosis not present

## 2015-11-18 DIAGNOSIS — M25561 Pain in right knee: Secondary | ICD-10-CM | POA: Diagnosis not present

## 2015-11-22 DIAGNOSIS — Z96651 Presence of right artificial knee joint: Secondary | ICD-10-CM | POA: Diagnosis not present

## 2015-11-22 DIAGNOSIS — Z471 Aftercare following joint replacement surgery: Secondary | ICD-10-CM | POA: Diagnosis not present

## 2015-12-19 DIAGNOSIS — H40013 Open angle with borderline findings, low risk, bilateral: Secondary | ICD-10-CM | POA: Diagnosis not present

## 2015-12-19 DIAGNOSIS — H47233 Glaucomatous optic atrophy, bilateral: Secondary | ICD-10-CM | POA: Diagnosis not present

## 2015-12-19 DIAGNOSIS — H5213 Myopia, bilateral: Secondary | ICD-10-CM | POA: Diagnosis not present

## 2015-12-19 DIAGNOSIS — Z7984 Long term (current) use of oral hypoglycemic drugs: Secondary | ICD-10-CM | POA: Diagnosis not present

## 2015-12-19 DIAGNOSIS — H524 Presbyopia: Secondary | ICD-10-CM | POA: Diagnosis not present

## 2015-12-19 DIAGNOSIS — E119 Type 2 diabetes mellitus without complications: Secondary | ICD-10-CM | POA: Diagnosis not present

## 2015-12-19 DIAGNOSIS — H52223 Regular astigmatism, bilateral: Secondary | ICD-10-CM | POA: Diagnosis not present

## 2015-12-19 DIAGNOSIS — I10 Essential (primary) hypertension: Secondary | ICD-10-CM | POA: Diagnosis not present

## 2016-02-07 DIAGNOSIS — H40053 Ocular hypertension, bilateral: Secondary | ICD-10-CM | POA: Diagnosis not present

## 2016-02-07 DIAGNOSIS — H401132 Primary open-angle glaucoma, bilateral, moderate stage: Secondary | ICD-10-CM | POA: Diagnosis not present

## 2016-02-07 DIAGNOSIS — H47233 Glaucomatous optic atrophy, bilateral: Secondary | ICD-10-CM | POA: Diagnosis not present

## 2016-02-07 DIAGNOSIS — I10 Essential (primary) hypertension: Secondary | ICD-10-CM | POA: Diagnosis not present

## 2016-03-15 DIAGNOSIS — H47233 Glaucomatous optic atrophy, bilateral: Secondary | ICD-10-CM | POA: Diagnosis not present

## 2016-03-15 DIAGNOSIS — H40013 Open angle with borderline findings, low risk, bilateral: Secondary | ICD-10-CM | POA: Diagnosis not present

## 2016-03-15 DIAGNOSIS — I10 Essential (primary) hypertension: Secondary | ICD-10-CM | POA: Diagnosis not present

## 2016-03-15 DIAGNOSIS — H401132 Primary open-angle glaucoma, bilateral, moderate stage: Secondary | ICD-10-CM | POA: Diagnosis not present

## 2016-03-19 DIAGNOSIS — M19072 Primary osteoarthritis, left ankle and foot: Secondary | ICD-10-CM | POA: Diagnosis not present

## 2016-04-03 DIAGNOSIS — E291 Testicular hypofunction: Secondary | ICD-10-CM | POA: Insufficient documentation

## 2016-04-03 DIAGNOSIS — E1142 Type 2 diabetes mellitus with diabetic polyneuropathy: Secondary | ICD-10-CM | POA: Insufficient documentation

## 2016-04-03 DIAGNOSIS — I10 Essential (primary) hypertension: Secondary | ICD-10-CM | POA: Insufficient documentation

## 2016-04-03 DIAGNOSIS — D126 Benign neoplasm of colon, unspecified: Secondary | ICD-10-CM | POA: Insufficient documentation

## 2016-04-03 DIAGNOSIS — Z79899 Other long term (current) drug therapy: Secondary | ICD-10-CM | POA: Insufficient documentation

## 2016-04-03 DIAGNOSIS — I517 Cardiomegaly: Secondary | ICD-10-CM | POA: Insufficient documentation

## 2016-04-03 DIAGNOSIS — M5414 Radiculopathy, thoracic region: Secondary | ICD-10-CM | POA: Insufficient documentation

## 2016-04-03 DIAGNOSIS — E669 Obesity, unspecified: Secondary | ICD-10-CM | POA: Insufficient documentation

## 2016-04-03 DIAGNOSIS — G543 Thoracic root disorders, not elsewhere classified: Secondary | ICD-10-CM | POA: Insufficient documentation

## 2016-04-03 DIAGNOSIS — M150 Primary generalized (osteo)arthritis: Secondary | ICD-10-CM

## 2016-04-03 DIAGNOSIS — H9190 Unspecified hearing loss, unspecified ear: Secondary | ICD-10-CM | POA: Insufficient documentation

## 2016-04-03 DIAGNOSIS — I499 Cardiac arrhythmia, unspecified: Secondary | ICD-10-CM | POA: Insufficient documentation

## 2016-04-03 DIAGNOSIS — M159 Polyosteoarthritis, unspecified: Secondary | ICD-10-CM | POA: Insufficient documentation

## 2016-04-03 DIAGNOSIS — G629 Polyneuropathy, unspecified: Secondary | ICD-10-CM | POA: Insufficient documentation

## 2016-04-03 DIAGNOSIS — N433 Hydrocele, unspecified: Secondary | ICD-10-CM | POA: Insufficient documentation

## 2016-04-03 DIAGNOSIS — G2581 Restless legs syndrome: Secondary | ICD-10-CM | POA: Insufficient documentation

## 2016-04-04 DIAGNOSIS — L239 Allergic contact dermatitis, unspecified cause: Secondary | ICD-10-CM | POA: Diagnosis not present

## 2016-04-04 DIAGNOSIS — M1A00X0 Idiopathic chronic gout, unspecified site, without tophus (tophi): Secondary | ICD-10-CM | POA: Insufficient documentation

## 2016-04-04 DIAGNOSIS — M1A09X0 Idiopathic chronic gout, multiple sites, without tophus (tophi): Secondary | ICD-10-CM | POA: Diagnosis not present

## 2016-04-04 DIAGNOSIS — I10 Essential (primary) hypertension: Secondary | ICD-10-CM | POA: Diagnosis not present

## 2016-04-04 DIAGNOSIS — M1A9XX0 Chronic gout, unspecified, without tophus (tophi): Secondary | ICD-10-CM | POA: Insufficient documentation

## 2016-04-04 DIAGNOSIS — M19072 Primary osteoarthritis, left ankle and foot: Secondary | ICD-10-CM | POA: Diagnosis not present

## 2016-04-04 DIAGNOSIS — E1142 Type 2 diabetes mellitus with diabetic polyneuropathy: Secondary | ICD-10-CM | POA: Diagnosis not present

## 2016-04-04 DIAGNOSIS — E291 Testicular hypofunction: Secondary | ICD-10-CM | POA: Diagnosis not present

## 2016-04-04 DIAGNOSIS — K219 Gastro-esophageal reflux disease without esophagitis: Secondary | ICD-10-CM | POA: Diagnosis not present

## 2016-04-04 DIAGNOSIS — E782 Mixed hyperlipidemia: Secondary | ICD-10-CM | POA: Insufficient documentation

## 2016-04-04 DIAGNOSIS — Z79899 Other long term (current) drug therapy: Secondary | ICD-10-CM | POA: Diagnosis not present

## 2016-04-04 DIAGNOSIS — M150 Primary generalized (osteo)arthritis: Secondary | ICD-10-CM | POA: Diagnosis not present

## 2016-04-04 DIAGNOSIS — G2581 Restless legs syndrome: Secondary | ICD-10-CM | POA: Diagnosis not present

## 2016-04-18 DIAGNOSIS — G2581 Restless legs syndrome: Secondary | ICD-10-CM | POA: Diagnosis not present

## 2016-04-18 DIAGNOSIS — G4733 Obstructive sleep apnea (adult) (pediatric): Secondary | ICD-10-CM | POA: Diagnosis not present

## 2016-05-01 DIAGNOSIS — I10 Essential (primary) hypertension: Secondary | ICD-10-CM | POA: Diagnosis not present

## 2016-05-01 DIAGNOSIS — I8310 Varicose veins of unspecified lower extremity with inflammation: Secondary | ICD-10-CM | POA: Diagnosis not present

## 2016-05-01 DIAGNOSIS — E1142 Type 2 diabetes mellitus with diabetic polyneuropathy: Secondary | ICD-10-CM | POA: Diagnosis not present

## 2016-05-01 DIAGNOSIS — L97921 Non-pressure chronic ulcer of unspecified part of left lower leg limited to breakdown of skin: Secondary | ICD-10-CM | POA: Diagnosis not present

## 2016-05-01 DIAGNOSIS — R600 Localized edema: Secondary | ICD-10-CM | POA: Diagnosis not present

## 2016-05-01 DIAGNOSIS — E6609 Other obesity due to excess calories: Secondary | ICD-10-CM | POA: Diagnosis not present

## 2016-05-01 DIAGNOSIS — Z79899 Other long term (current) drug therapy: Secondary | ICD-10-CM | POA: Diagnosis not present

## 2016-05-22 DIAGNOSIS — L300 Nummular dermatitis: Secondary | ICD-10-CM | POA: Diagnosis not present

## 2016-05-22 DIAGNOSIS — Z471 Aftercare following joint replacement surgery: Secondary | ICD-10-CM | POA: Diagnosis not present

## 2016-05-22 DIAGNOSIS — I8310 Varicose veins of unspecified lower extremity with inflammation: Secondary | ICD-10-CM | POA: Diagnosis not present

## 2016-05-22 DIAGNOSIS — Z96651 Presence of right artificial knee joint: Secondary | ICD-10-CM | POA: Diagnosis not present

## 2016-05-30 DIAGNOSIS — G4733 Obstructive sleep apnea (adult) (pediatric): Secondary | ICD-10-CM | POA: Diagnosis not present

## 2016-06-06 DIAGNOSIS — G4733 Obstructive sleep apnea (adult) (pediatric): Secondary | ICD-10-CM | POA: Diagnosis not present

## 2016-06-13 IMAGING — RF DG HIP (WITH PELVIS) OPERATIVE*R*
1 series · 2 of 2 positions shown · non-contrast
Comparison: None.

CLINICAL DATA: Status post total hip replacement on the right

EXAM:
DG C-ARM GT 120 MIN-NO REPORT; OPERATIVE RIGHT HIP

[Series 1: run · 2 of 2 slices shown]
[im 1/2]
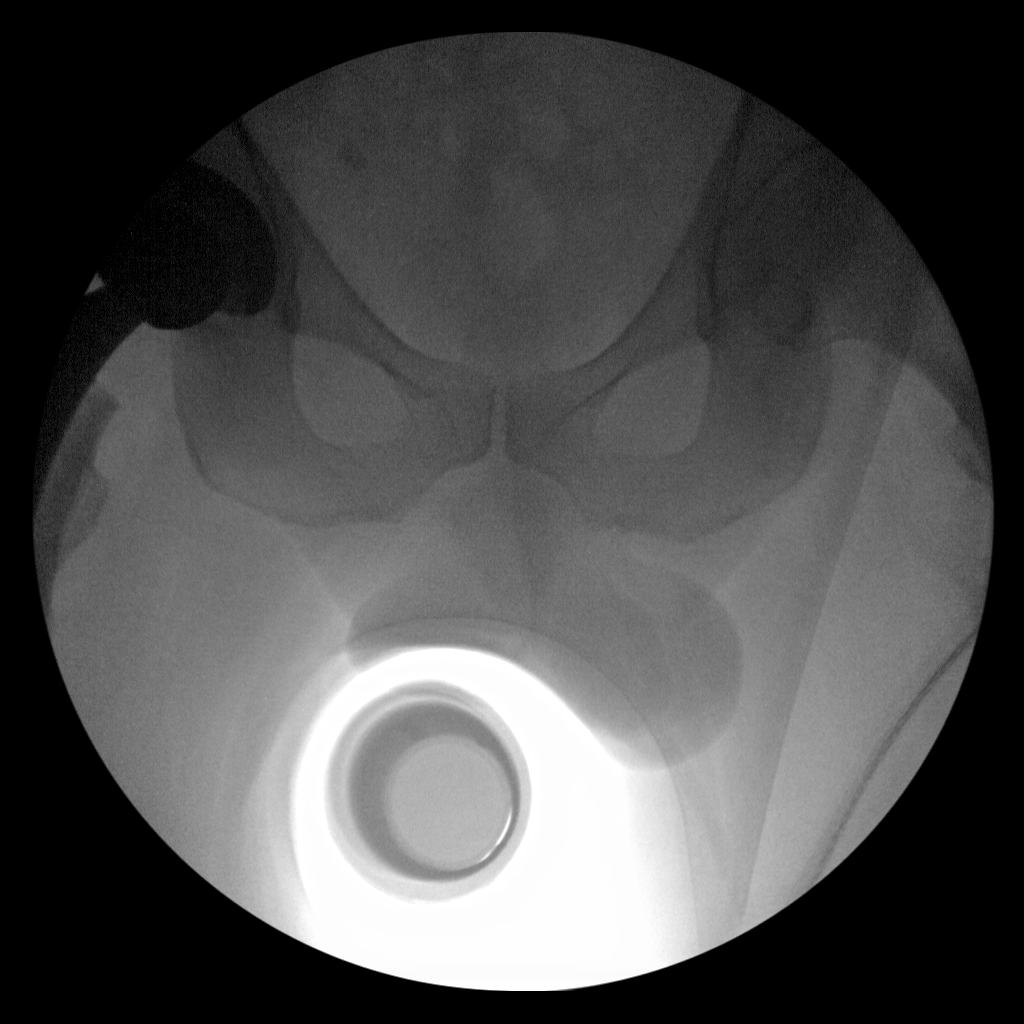
[im 2/2]
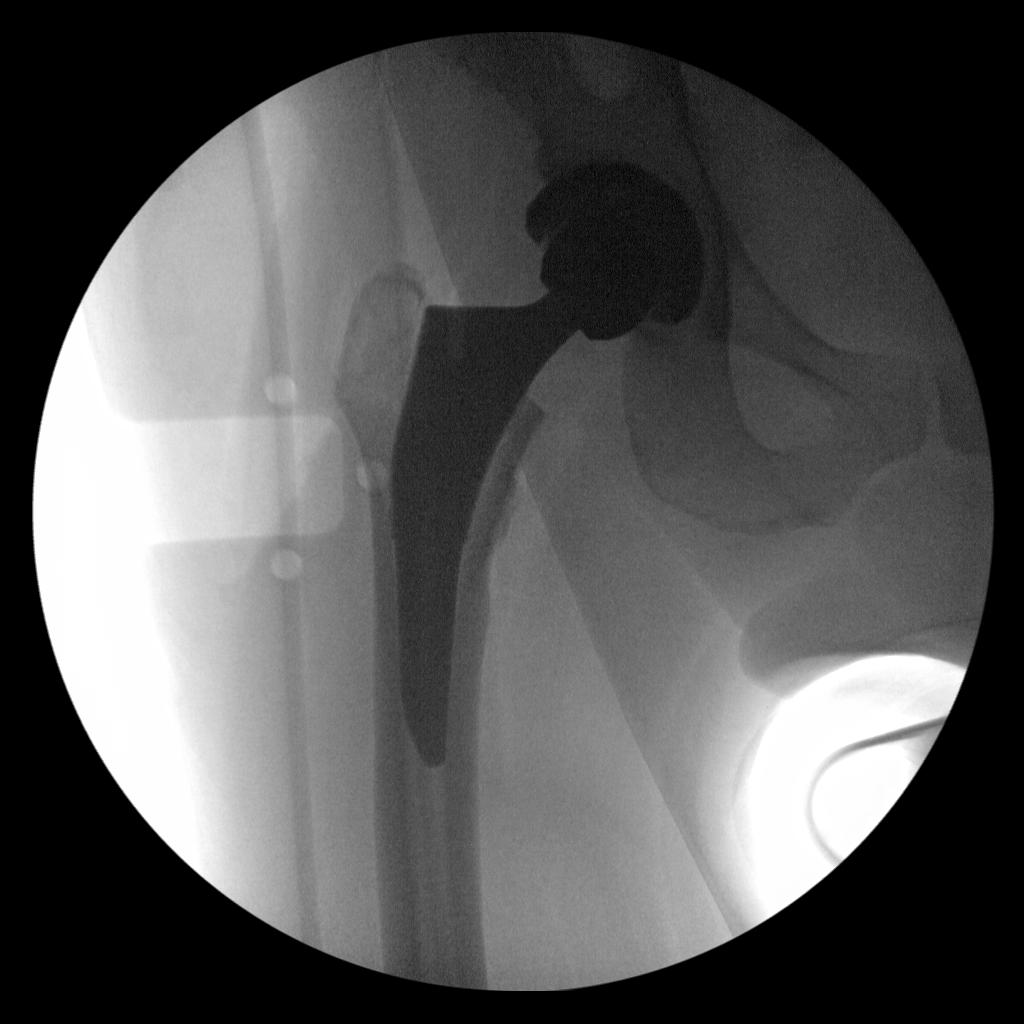

[2 of 2 positions shown; findings below may reference images not displayed]

FLUOROSCOPY TIME:  Fluoroscopy Time:  0 minutes, 54 second

Number of Acquired Images:  2
FINDINGS: Frontal right hip image shows a total hip prosthesis with prosthetic
components appearing well-seated. No fracture or dislocation
apparent.
IMPRESSION: Prosthetic components appear well seated on the right. No fracture
or dislocation apparent on single view.

## 2016-06-13 IMAGING — DX DG PORTABLE PELVIS
1 series · 1 of 1 positions shown · non-contrast
Comparison: None.

CLINICAL DATA: Postop evaluation right hip replacement.

EXAM:
PORTABLE PELVIS 1-2 VIEWS

[pelvis ap]
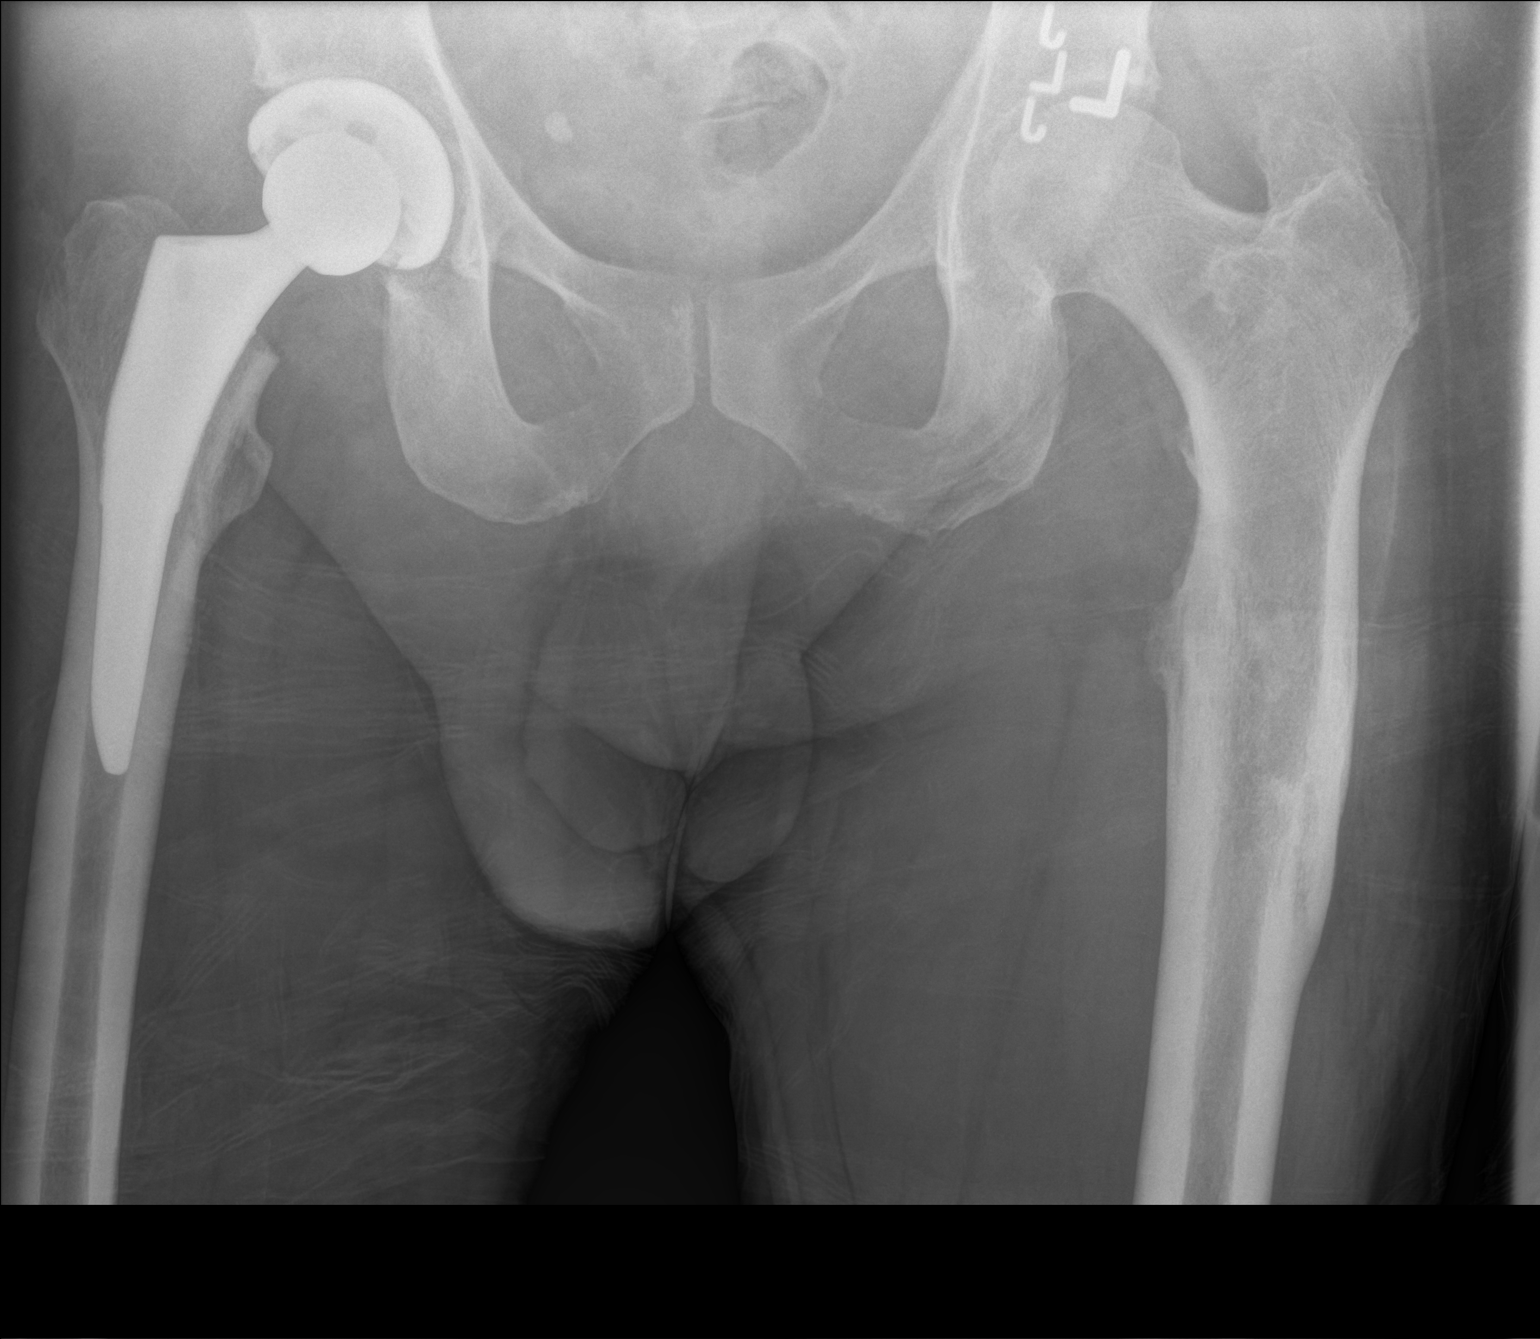

[1 of 1 positions shown; findings below may reference images not displayed]

FINDINGS: The lower pelvis is included on current radiograph. Patient status
post right hip arthroplasty. Hardware appears in appropriate
position on single AP portable view. No evidence for displaced
fracture. Right pelvic phlebolith.
IMPRESSION: Patient status post right hip arthroplasty.

## 2016-06-18 DIAGNOSIS — H401131 Primary open-angle glaucoma, bilateral, mild stage: Secondary | ICD-10-CM | POA: Diagnosis not present

## 2016-06-18 DIAGNOSIS — I10 Essential (primary) hypertension: Secondary | ICD-10-CM | POA: Diagnosis not present

## 2016-06-18 DIAGNOSIS — H47233 Glaucomatous optic atrophy, bilateral: Secondary | ICD-10-CM | POA: Diagnosis not present

## 2016-06-25 DIAGNOSIS — G4733 Obstructive sleep apnea (adult) (pediatric): Secondary | ICD-10-CM | POA: Diagnosis not present

## 2016-07-09 DIAGNOSIS — G4733 Obstructive sleep apnea (adult) (pediatric): Secondary | ICD-10-CM | POA: Diagnosis not present

## 2016-07-13 DIAGNOSIS — Z79899 Other long term (current) drug therapy: Secondary | ICD-10-CM | POA: Diagnosis not present

## 2016-07-13 DIAGNOSIS — D126 Benign neoplasm of colon, unspecified: Secondary | ICD-10-CM | POA: Diagnosis not present

## 2016-07-13 DIAGNOSIS — I517 Cardiomegaly: Secondary | ICD-10-CM | POA: Diagnosis not present

## 2016-07-13 DIAGNOSIS — Z0000 Encounter for general adult medical examination without abnormal findings: Secondary | ICD-10-CM | POA: Diagnosis not present

## 2016-07-13 DIAGNOSIS — Z125 Encounter for screening for malignant neoplasm of prostate: Secondary | ICD-10-CM | POA: Diagnosis not present

## 2016-07-13 DIAGNOSIS — E782 Mixed hyperlipidemia: Secondary | ICD-10-CM | POA: Diagnosis not present

## 2016-07-13 DIAGNOSIS — I10 Essential (primary) hypertension: Secondary | ICD-10-CM | POA: Diagnosis not present

## 2016-07-13 DIAGNOSIS — N433 Hydrocele, unspecified: Secondary | ICD-10-CM | POA: Diagnosis not present

## 2016-07-13 DIAGNOSIS — E6609 Other obesity due to excess calories: Secondary | ICD-10-CM | POA: Diagnosis not present

## 2016-07-13 DIAGNOSIS — G2581 Restless legs syndrome: Secondary | ICD-10-CM | POA: Diagnosis not present

## 2016-07-13 DIAGNOSIS — M150 Primary generalized (osteo)arthritis: Secondary | ICD-10-CM | POA: Diagnosis not present

## 2016-07-13 DIAGNOSIS — K219 Gastro-esophageal reflux disease without esophagitis: Secondary | ICD-10-CM | POA: Diagnosis not present

## 2016-07-13 DIAGNOSIS — Z6830 Body mass index (BMI) 30.0-30.9, adult: Secondary | ICD-10-CM | POA: Diagnosis not present

## 2016-07-13 DIAGNOSIS — E1142 Type 2 diabetes mellitus with diabetic polyneuropathy: Secondary | ICD-10-CM | POA: Diagnosis not present

## 2016-07-17 DIAGNOSIS — Z23 Encounter for immunization: Secondary | ICD-10-CM | POA: Diagnosis not present

## 2016-08-09 DIAGNOSIS — G4733 Obstructive sleep apnea (adult) (pediatric): Secondary | ICD-10-CM | POA: Diagnosis not present

## 2016-08-14 DIAGNOSIS — L299 Pruritus, unspecified: Secondary | ICD-10-CM | POA: Diagnosis not present

## 2016-08-14 DIAGNOSIS — L219 Seborrheic dermatitis, unspecified: Secondary | ICD-10-CM | POA: Diagnosis not present

## 2016-08-14 DIAGNOSIS — L300 Nummular dermatitis: Secondary | ICD-10-CM | POA: Diagnosis not present

## 2016-08-31 DIAGNOSIS — G4733 Obstructive sleep apnea (adult) (pediatric): Secondary | ICD-10-CM | POA: Diagnosis not present

## 2016-09-04 DIAGNOSIS — L299 Pruritus, unspecified: Secondary | ICD-10-CM | POA: Diagnosis not present

## 2016-09-04 DIAGNOSIS — L0201 Cutaneous abscess of face: Secondary | ICD-10-CM | POA: Diagnosis not present

## 2016-09-04 DIAGNOSIS — L300 Nummular dermatitis: Secondary | ICD-10-CM | POA: Diagnosis not present

## 2016-09-08 DIAGNOSIS — G4733 Obstructive sleep apnea (adult) (pediatric): Secondary | ICD-10-CM | POA: Diagnosis not present

## 2016-09-18 DIAGNOSIS — H47233 Glaucomatous optic atrophy, bilateral: Secondary | ICD-10-CM | POA: Diagnosis not present

## 2016-09-18 DIAGNOSIS — H401131 Primary open-angle glaucoma, bilateral, mild stage: Secondary | ICD-10-CM | POA: Diagnosis not present

## 2016-09-18 DIAGNOSIS — I10 Essential (primary) hypertension: Secondary | ICD-10-CM | POA: Diagnosis not present

## 2016-09-25 DIAGNOSIS — Z96651 Presence of right artificial knee joint: Secondary | ICD-10-CM | POA: Diagnosis not present

## 2016-09-25 DIAGNOSIS — Z471 Aftercare following joint replacement surgery: Secondary | ICD-10-CM | POA: Diagnosis not present

## 2016-10-02 DIAGNOSIS — C44319 Basal cell carcinoma of skin of other parts of face: Secondary | ICD-10-CM | POA: Diagnosis not present

## 2016-10-02 DIAGNOSIS — B351 Tinea unguium: Secondary | ICD-10-CM | POA: Diagnosis not present

## 2016-10-09 DIAGNOSIS — G4733 Obstructive sleep apnea (adult) (pediatric): Secondary | ICD-10-CM | POA: Diagnosis not present

## 2016-10-25 DIAGNOSIS — J302 Other seasonal allergic rhinitis: Secondary | ICD-10-CM | POA: Diagnosis not present

## 2016-11-08 DIAGNOSIS — G4733 Obstructive sleep apnea (adult) (pediatric): Secondary | ICD-10-CM | POA: Diagnosis not present

## 2016-11-14 DIAGNOSIS — C44329 Squamous cell carcinoma of skin of other parts of face: Secondary | ICD-10-CM | POA: Diagnosis not present

## 2016-12-05 DIAGNOSIS — G4733 Obstructive sleep apnea (adult) (pediatric): Secondary | ICD-10-CM | POA: Diagnosis not present

## 2016-12-09 DIAGNOSIS — G4733 Obstructive sleep apnea (adult) (pediatric): Secondary | ICD-10-CM | POA: Diagnosis not present

## 2016-12-12 DIAGNOSIS — E669 Obesity, unspecified: Secondary | ICD-10-CM | POA: Diagnosis not present

## 2016-12-12 DIAGNOSIS — K219 Gastro-esophageal reflux disease without esophagitis: Secondary | ICD-10-CM | POA: Diagnosis not present

## 2016-12-12 DIAGNOSIS — I10 Essential (primary) hypertension: Secondary | ICD-10-CM | POA: Diagnosis not present

## 2016-12-12 DIAGNOSIS — E782 Mixed hyperlipidemia: Secondary | ICD-10-CM | POA: Diagnosis not present

## 2016-12-12 DIAGNOSIS — E1142 Type 2 diabetes mellitus with diabetic polyneuropathy: Secondary | ICD-10-CM | POA: Diagnosis not present

## 2016-12-12 DIAGNOSIS — M150 Primary generalized (osteo)arthritis: Secondary | ICD-10-CM | POA: Diagnosis not present

## 2016-12-12 DIAGNOSIS — Z79899 Other long term (current) drug therapy: Secondary | ICD-10-CM | POA: Diagnosis not present

## 2016-12-13 DIAGNOSIS — E538 Deficiency of other specified B group vitamins: Secondary | ICD-10-CM | POA: Insufficient documentation

## 2016-12-14 DIAGNOSIS — G4733 Obstructive sleep apnea (adult) (pediatric): Secondary | ICD-10-CM | POA: Diagnosis not present

## 2016-12-19 DIAGNOSIS — H401131 Primary open-angle glaucoma, bilateral, mild stage: Secondary | ICD-10-CM | POA: Diagnosis not present

## 2016-12-19 DIAGNOSIS — H5213 Myopia, bilateral: Secondary | ICD-10-CM | POA: Diagnosis not present

## 2016-12-19 DIAGNOSIS — Z794 Long term (current) use of insulin: Secondary | ICD-10-CM | POA: Diagnosis not present

## 2016-12-19 DIAGNOSIS — H52223 Regular astigmatism, bilateral: Secondary | ICD-10-CM | POA: Diagnosis not present

## 2016-12-19 DIAGNOSIS — H25813 Combined forms of age-related cataract, bilateral: Secondary | ICD-10-CM | POA: Diagnosis not present

## 2016-12-19 DIAGNOSIS — Z7984 Long term (current) use of oral hypoglycemic drugs: Secondary | ICD-10-CM | POA: Diagnosis not present

## 2016-12-19 DIAGNOSIS — I10 Essential (primary) hypertension: Secondary | ICD-10-CM | POA: Diagnosis not present

## 2016-12-19 DIAGNOSIS — H524 Presbyopia: Secondary | ICD-10-CM | POA: Diagnosis not present

## 2016-12-19 DIAGNOSIS — E119 Type 2 diabetes mellitus without complications: Secondary | ICD-10-CM | POA: Diagnosis not present

## 2016-12-19 DIAGNOSIS — H47233 Glaucomatous optic atrophy, bilateral: Secondary | ICD-10-CM | POA: Diagnosis not present

## 2017-01-02 ENCOUNTER — Ambulatory Visit (INDEPENDENT_AMBULATORY_CARE_PROVIDER_SITE_OTHER): Payer: PPO

## 2017-01-02 ENCOUNTER — Ambulatory Visit (INDEPENDENT_AMBULATORY_CARE_PROVIDER_SITE_OTHER): Payer: PPO | Admitting: Sports Medicine

## 2017-01-02 ENCOUNTER — Encounter: Payer: Self-pay | Admitting: Sports Medicine

## 2017-01-02 DIAGNOSIS — M79672 Pain in left foot: Secondary | ICD-10-CM

## 2017-01-02 DIAGNOSIS — M21622 Bunionette of left foot: Secondary | ICD-10-CM

## 2017-01-02 DIAGNOSIS — M79671 Pain in right foot: Secondary | ICD-10-CM

## 2017-01-02 DIAGNOSIS — M216X9 Other acquired deformities of unspecified foot: Secondary | ICD-10-CM

## 2017-01-02 DIAGNOSIS — M779 Enthesopathy, unspecified: Secondary | ICD-10-CM

## 2017-01-02 MED ORDER — TRIAMCINOLONE ACETONIDE 40 MG/ML IJ SUSP: 20.0000 mg | Freq: Once | INTRAMUSCULAR | Status: AC

## 2017-01-02 NOTE — Progress Notes (Signed)
Subjective: Samuel Hodge is a 66 y.o. male patient who presents to office for evaluation of Bilateral foot pain. Patient complains of progressive pain especially over the last few years in both feet; history of arthritis saw Dr. Oralia Rud who offers ankle replacement or fusion; patient decline. Reports that both feet hurt along the sides where he gets rubbing and irritation when in shoes. States that he uses good supportive tennis shoes and has had bracing in the past. However, as the ankle on the left started to change. She noticed rubbing and irritation. Thus stopped wearing brace. Patient denies any other pedal complaints.  Patient Active Problem List   Diagnosis Date Noted  . Vitamin B12 deficiency 12/13/2016  . Chronic gout of multiple sites 04/04/2016  . Chronic gouty arthritis 04/04/2016  . Gastroesophageal reflux disease without esophagitis 04/04/2016  . Mixed hyperlipidemia 04/04/2016  . Multiple-type hyperlipidemia 04/04/2016  . Benign neoplasm of colon 04/03/2016  . Cardiac arrhythmia 04/03/2016  . Cardiac enlargement 04/03/2016  . Cardiomegaly 04/03/2016  . Diabetes, polyneuropathy ([place]) 04/03/2016  . Diabetic polyneuropathy ([place]) 04/03/2016  . Essential hypertension 04/03/2016  . Generalized OA 04/03/2016  . Hearing loss 04/03/2016  . High risk medication use 04/03/2016  . Hydrocele of testis 04/03/2016  . Hypogonadism in male 04/03/2016  . Obesity (BMI 30-39.9) 04/03/2016  . Peripheral neuropathy ([place]) 04/03/2016  . Primary osteoarthritis involving multiple joints 04/03/2016  . RLS (restless legs syndrome) 04/03/2016  . Thoracic radiculopathy 04/03/2016  . Thoracic root lesion 04/03/2016  . Primary osteoarthritis of right knee 10/10/2015  . Restless legs syndrome 12/14/2013  . Dyspnea and respiratory abnormality 02/16/2013    Current Outpatient Prescriptions on File Prior to Visit  Medication Sig Dispense Refill  . amLODipine (NORVASC) 5 MG tablet Take 5 mg by mouth  daily.    Marland Kitchen apixaban (ELIQUIS) 2.5 MG TABS tablet Take 1 tablet (2.5 mg total) by mouth every 12 (twelve) hours. 60 tablet 0  . docusate sodium (COLACE) 100 MG capsule Take 1 capsule (100 mg total) by mouth 2 (two) times daily. (Patient taking differently: Take 100 mg by mouth 2 (two) times daily as needed for mild constipation. ) 60 capsule 3  . gabapentin (NEURONTIN) 600 MG tablet Take 1,200 mg by mouth 2 (two) times daily.     Marland Kitchen glimepiride (AMARYL) 1 MG tablet Take 1 mg by mouth daily with breakfast.    . hydrochlorothiazide (HYDRODIURIL) 25 MG tablet Take 25 mg by mouth daily.    Marland Kitchen HYDROcodone-acetaminophen (NORCO) 5-325 MG tablet Take 1-2 tablets by mouth every 4 (four) hours as needed for moderate pain. 90 tablet 0  . lisinopril (PRINIVIL,ZESTRIL) 20 MG tablet Take 30 mg by mouth daily.    . metFORMIN (GLUCOPHAGE-XR) 500 MG 24 hr tablet Take 1,000 mg by mouth 2 (two) times daily.    . ondansetron (ZOFRAN) 4 MG tablet Take 1 tablet (4 mg total) by mouth every 6 (six) hours as needed for nausea. 20 tablet 0  . oxyCODONE (OXYCONTIN) 10 mg 12 hr tablet Take 1 tablet (10 mg total) by mouth PRO. 1 tab PO every 12 hours for 3 days, then 1 tab PO daily for 4 days 10 tablet 0  . pantoprazole (PROTONIX) 40 MG tablet Take 40 mg by mouth daily.    Marland Kitchen rOPINIRole (REQUIP XL) 8 MG 24 hr tablet Take 8 mg by mouth at bedtime.    . senna (SENOKOT) 8.6 MG TABS tablet Take 2 tablets (17.2 mg total) by mouth at  bedtime. 60 each 3   No current facility-administered medications on file prior to visit.     Allergies  Allergen Reactions  . Aleve [Naproxen Sodium] Hives  . Aspirin Other (See Comments)    Unsure Unknown "childhood allergy"  . Penicillins Other (See Comments)    Unsure All pt remembers is nose bleeds associated with this reaction.  Has patient had a PCN reaction causing immediate rash, facial/tongue/throat swelling, SOB or lightheadedness with hypotension: Unknown Has patient had a PCN  reaction causing severe rash involving mucus membranes or skin necrosis: Unknown Has patient had a PCN reaction that required hospitalization: Yes Has patient had a PCN reaction occurring within the last 10 years: No If all of the above answers are "NO", then may proceed with Cepha  . Latex Rash    Objective:  General: Alert and oriented x3 in no acute distress  Dermatology: Mild reactive callusing right foot styloid process and left foot sub-met 5 with mild surrounding blanchable erythema suggestive of irritation and rubbing in shoes. No open lesions bilateral lower extremities, no webspace macerations, no ecchymosis bilateral, all nails x 10 are well manicured.  Vascular: Dorsalis Pedis and Posterior Tibial pedal pulses palpable, Capillary Fill Time 3 seconds,(+) pedal hair growth bilateral, no edema bilateral lower extremities, Temperature gradient within normal limits.  Neurology: Johney Maine sensation intact via light touch bilateral. (- )Tinels sign bilateral.   Musculoskeletal: Mild tenderness with palpation at fifth metatarsal head on left at area of tailors bunion and styloid process on right foot, cavovarus foot type. No pain with calf compression bilateral. There is decreased range of motion bilateral with history significant for arthritis. Strength within normal limits in all groups bilateral.   Gait: Antalgic gait  Xrays  Right and left foot   Impression: Pes cavus foot type with severe midtarsal collapse and  varus or adductus attitude metatarsals with severe arthritis left tailors bunion and right prominent fifth metatarsal base, calcaneal spurs, no acute fracture, soft tissue margins within normal limits.  Assessment and Plan: Problem List Items Addressed This Visit    None    Visit Diagnoses    Capsulitis    -  Primary   Relevant Medications   triamcinolone acetonide (KENALOG-40) injection 20 mg   Pain in both feet       Relevant Orders   DG Foot 2 Views Left (Completed)    DG Foot 2 Views Right (Completed)   Cavus deformity of foot, acquired       Tailor's bunion of left foot       Prominent metatarsal head, unspecified laterality           -Complete examination performed -Xrays reviewed -Discussed treatement optionsFor bony prominences with surrounding local inflammation secondary to foot type with significant underlying arthritis -After oral consent and aseptic prep, injected a mixture containing 1 ml of 2% plain lidocaine, 1 ml 0.5% plain marcaine, 0.5 ml of kenalog 40 and 0.5 ml of dexamethasone phosphate into left fifth metatarsophalangeal joint and right styloid process without complication. Post-injection care discussed with patient.  -Recommend icing daily until symptoms improve -Applied felt offloading padding in shoes and advised patient this works will will benefit from custom control orthotics or new bracing patient to bring bracing at next visit in 3 weeks -Patient to return to office in 3 weeks/as needed or sooner if condition worsens.  Landis Martins, DPM

## 2017-01-09 DIAGNOSIS — G4733 Obstructive sleep apnea (adult) (pediatric): Secondary | ICD-10-CM | POA: Diagnosis not present

## 2017-01-23 ENCOUNTER — Ambulatory Visit (INDEPENDENT_AMBULATORY_CARE_PROVIDER_SITE_OTHER): Payer: PPO | Admitting: Sports Medicine

## 2017-01-23 ENCOUNTER — Encounter: Payer: Self-pay | Admitting: Sports Medicine

## 2017-01-23 DIAGNOSIS — M79672 Pain in left foot: Secondary | ICD-10-CM | POA: Diagnosis not present

## 2017-01-23 DIAGNOSIS — M79671 Pain in right foot: Secondary | ICD-10-CM

## 2017-01-23 DIAGNOSIS — M779 Enthesopathy, unspecified: Secondary | ICD-10-CM | POA: Diagnosis not present

## 2017-01-23 DIAGNOSIS — M216X9 Other acquired deformities of unspecified foot: Secondary | ICD-10-CM

## 2017-01-23 DIAGNOSIS — M21622 Bunionette of left foot: Secondary | ICD-10-CM | POA: Diagnosis not present

## 2017-01-23 NOTE — Progress Notes (Signed)
Subjective: Samuel Hodge is a 66 y.o. male patient who returns to office for evaluation of Bilateral foot pain. Patient states that injections in both feet did not help, still having pain especially on left 5th MTPJ. Patient denies any other pedal complaints.  Patient Active Problem List   Diagnosis Date Noted  . Vitamin B12 deficiency 12/13/2016  . Chronic gout of multiple sites 04/04/2016  . Chronic gouty arthritis 04/04/2016  . Gastroesophageal reflux disease without esophagitis 04/04/2016  . Mixed hyperlipidemia 04/04/2016  . Multiple-type hyperlipidemia 04/04/2016  . Benign neoplasm of colon 04/03/2016  . Cardiac arrhythmia 04/03/2016  . Cardiac enlargement 04/03/2016  . Cardiomegaly 04/03/2016  . Diabetes, polyneuropathy ([place]) 04/03/2016  . Diabetic polyneuropathy ([place]) 04/03/2016  . Essential hypertension 04/03/2016  . Generalized OA 04/03/2016  . Hearing loss 04/03/2016  . High risk medication use 04/03/2016  . Hydrocele of testis 04/03/2016  . Hypogonadism in male 04/03/2016  . Obesity (BMI 30-39.9) 04/03/2016  . Peripheral neuropathy ([place]) 04/03/2016  . Primary osteoarthritis involving multiple joints 04/03/2016  . RLS (restless legs syndrome) 04/03/2016  . Thoracic radiculopathy 04/03/2016  . Thoracic root lesion 04/03/2016  . Primary osteoarthritis of right knee 10/10/2015  . Restless legs syndrome 12/14/2013  . Dyspnea and respiratory abnormality 02/16/2013    Current Outpatient Prescriptions on File Prior to Visit  Medication Sig Dispense Refill  . amLODipine (NORVASC) 5 MG tablet Take 5 mg by mouth daily.    Marland Kitchen apixaban (ELIQUIS) 2.5 MG TABS tablet Take 1 tablet (2.5 mg total) by mouth every 12 (twelve) hours. 60 tablet 0  . azithromycin (ZITHROMAX) 250 MG tablet     . CLOBETASOL PROPIONATE E 0.05 % emollient cream     . cyclobenzaprine (FLEXERIL) 10 MG tablet Take 10 mg by mouth.    . docusate sodium (COLACE) 100 MG capsule Take 1 capsule (100 mg total) by  mouth 2 (two) times daily. (Patient taking differently: Take 100 mg by mouth 2 (two) times daily as needed for mild constipation. ) 60 capsule 3  . gabapentin (NEURONTIN) 600 MG tablet Take 1,200 mg by mouth 2 (two) times daily.     . Gabapentin Enacarbil 600 MG TBCR Take 1 pill with evening meal    . glimepiride (AMARYL) 1 MG tablet Take 1 mg by mouth daily with breakfast.    . glucose blood (FREESTYLE LITE) test strip USE 1 STRIP EVERY DAY    . hydrochlorothiazide (HYDRODIURIL) 25 MG tablet Take 25 mg by mouth daily.    Marland Kitchen HYDROcodone-acetaminophen (NORCO) 5-325 MG tablet Take 1-2 tablets by mouth every 4 (four) hours as needed for moderate pain. 90 tablet 0  . Lancets (FREESTYLE) lancets CHECK BLOOD SUGAR EVERY DAY AS DIRECTED    . latanoprost (XALATAN) 0.005 % ophthalmic solution     . latanoprost (XALATAN) 0.005 % ophthalmic solution Place 0.005 drops into both eyes daily.    Marland Kitchen lisinopril (PRINIVIL,ZESTRIL) 20 MG tablet Take 30 mg by mouth daily.    . metFORMIN (GLUCOPHAGE-XR) 500 MG 24 hr tablet Take 1,000 mg by mouth 2 (two) times daily.    . ondansetron (ZOFRAN) 4 MG tablet Take 1 tablet (4 mg total) by mouth every 6 (six) hours as needed for nausea. 20 tablet 0  . oxyCODONE (OXYCONTIN) 10 mg 12 hr tablet Take 1 tablet (10 mg total) by mouth PRO. 1 tab PO every 12 hours for 3 days, then 1 tab PO daily for 4 days 10 tablet 0  . pantoprazole (PROTONIX)  40 MG tablet Take 40 mg by mouth daily.    Marland Kitchen rOPINIRole (REQUIP XL) 8 MG 24 hr tablet Take 8 mg by mouth at bedtime.    . rosuvastatin (CRESTOR) 10 MG tablet Take 10 mg by mouth.    . senna (SENOKOT) 8.6 MG TABS tablet Take 2 tablets (17.2 mg total) by mouth at bedtime. 60 each 3  . terbinafine (LAMISIL) 250 MG tablet      Current Facility-Administered Medications on File Prior to Visit  Medication Dose Route Frequency Provider Last Rate Last Dose  . triamcinolone acetonide (KENALOG-40) injection 20 mg  20 mg Other Once Landis Martins, DPM         Allergies  Allergen Reactions  . Aleve [Naproxen Sodium] Hives  . Aspirin Other (See Comments)    Unsure Unknown "childhood allergy"  . Penicillins Other (See Comments)    Unsure All pt remembers is nose bleeds associated with this reaction.  Has patient had a PCN reaction causing immediate rash, facial/tongue/throat swelling, SOB or lightheadedness with hypotension: Unknown Has patient had a PCN reaction causing severe rash involving mucus membranes or skin necrosis: Unknown Has patient had a PCN reaction that required hospitalization: Yes Has patient had a PCN reaction occurring within the last 10 years: No If all of the above answers are "NO", then may proceed with Cepha  . Latex Rash    Objective:  General: Alert and oriented x3 in no acute distress  Dermatology: Mild reactive callusing right foot styloid process and left foot sub-met 5 with mild surrounding blanchable erythema suggestive of irritation and rubbing in shoes. No open lesions bilateral lower extremities, no webspace macerations, no ecchymosis bilateral, all nails x 10 are well manicured.  Vascular: Dorsalis Pedis and Posterior Tibial pedal pulses palpable, Capillary Fill Time 3 seconds,(+) pedal hair growth bilateral, no edema bilateral lower extremities, Temperature gradient within normal limits.  Neurology: Johney Maine sensation intact via light touch bilateral. (- )Tinels sign bilateral.   Musculoskeletal: Mild tenderness with palpation at fifth metatarsal head on left at area of tailors bunion and styloid process on right foot, cavovarus foot type. No pain with calf compression bilateral. There is decreased range of motion bilateral with history significant for arthritis with fixed varus ankle. Strength within normal limits in all groups bilateral.   Assessment and Plan: Problem List Items Addressed This Visit    None    Visit Diagnoses    Capsulitis    -  Primary   Tailor's bunion of left foot        Prominent metatarsal head, unspecified laterality       Cavus deformity of foot, acquired       Pain in both feet           -Complete examination performed -Discussed treatement optionsFor bony prominences with surrounding local inflammation secondary to foot type with significant underlying arthritis -Patient does not want surgery at this time - Evaluated Richie braces at this time these braces are not ideal because of fixed ankle position  -Dispensed silicone 5th MTPJ tailors bunion sheild left foot -Recommend icing daily until symptoms improve -Applied felt additional offloading padding in shoes -Patient to return to office as needed or sooner if condition worsens.  Landis Martins, DPM

## 2017-02-06 DIAGNOSIS — G4733 Obstructive sleep apnea (adult) (pediatric): Secondary | ICD-10-CM | POA: Diagnosis not present

## 2017-02-12 DIAGNOSIS — E782 Mixed hyperlipidemia: Secondary | ICD-10-CM | POA: Diagnosis not present

## 2017-02-12 DIAGNOSIS — Z79899 Other long term (current) drug therapy: Secondary | ICD-10-CM | POA: Diagnosis not present

## 2017-02-12 DIAGNOSIS — E538 Deficiency of other specified B group vitamins: Secondary | ICD-10-CM | POA: Diagnosis not present

## 2017-02-25 DIAGNOSIS — M545 Low back pain: Secondary | ICD-10-CM | POA: Diagnosis not present

## 2017-02-25 DIAGNOSIS — M9905 Segmental and somatic dysfunction of pelvic region: Secondary | ICD-10-CM | POA: Diagnosis not present

## 2017-02-25 DIAGNOSIS — M9902 Segmental and somatic dysfunction of thoracic region: Secondary | ICD-10-CM | POA: Diagnosis not present

## 2017-02-25 DIAGNOSIS — M9903 Segmental and somatic dysfunction of lumbar region: Secondary | ICD-10-CM | POA: Diagnosis not present

## 2017-02-27 DIAGNOSIS — M9905 Segmental and somatic dysfunction of pelvic region: Secondary | ICD-10-CM | POA: Diagnosis not present

## 2017-02-27 DIAGNOSIS — M9903 Segmental and somatic dysfunction of lumbar region: Secondary | ICD-10-CM | POA: Diagnosis not present

## 2017-02-27 DIAGNOSIS — M9902 Segmental and somatic dysfunction of thoracic region: Secondary | ICD-10-CM | POA: Diagnosis not present

## 2017-02-27 DIAGNOSIS — M545 Low back pain: Secondary | ICD-10-CM | POA: Diagnosis not present

## 2017-03-01 ENCOUNTER — Encounter: Payer: Self-pay | Admitting: Sports Medicine

## 2017-03-01 ENCOUNTER — Ambulatory Visit (INDEPENDENT_AMBULATORY_CARE_PROVIDER_SITE_OTHER): Payer: PPO | Admitting: Sports Medicine

## 2017-03-01 DIAGNOSIS — M9902 Segmental and somatic dysfunction of thoracic region: Secondary | ICD-10-CM | POA: Diagnosis not present

## 2017-03-01 DIAGNOSIS — M779 Enthesopathy, unspecified: Secondary | ICD-10-CM

## 2017-03-01 DIAGNOSIS — M545 Low back pain: Secondary | ICD-10-CM | POA: Diagnosis not present

## 2017-03-01 DIAGNOSIS — M216X9 Other acquired deformities of unspecified foot: Secondary | ICD-10-CM

## 2017-03-01 DIAGNOSIS — M21622 Bunionette of left foot: Secondary | ICD-10-CM

## 2017-03-01 DIAGNOSIS — M9903 Segmental and somatic dysfunction of lumbar region: Secondary | ICD-10-CM | POA: Diagnosis not present

## 2017-03-01 DIAGNOSIS — M9905 Segmental and somatic dysfunction of pelvic region: Secondary | ICD-10-CM | POA: Diagnosis not present

## 2017-03-01 MED ORDER — TRIAMCINOLONE ACETONIDE 10 MG/ML IJ SUSP: 10.0000 mg | Freq: Once | INTRAMUSCULAR | Status: AC

## 2017-03-01 NOTE — Progress Notes (Signed)
Subjective: Samuel Hodge is a 66 y.o. male patient who returns to office for evaluation of left foot pain.Patient states still having pain especially on left 5th MTPJ. Patient denies any other pedal complaints.  Patient Active Problem List   Diagnosis Date Noted  . Vitamin B12 deficiency 12/13/2016  . Chronic gout of multiple sites 04/04/2016  . Chronic gouty arthritis 04/04/2016  . Gastroesophageal reflux disease without esophagitis 04/04/2016  . Mixed hyperlipidemia 04/04/2016  . Multiple-type hyperlipidemia 04/04/2016  . Benign neoplasm of colon 04/03/2016  . Cardiac arrhythmia 04/03/2016  . Cardiac enlargement 04/03/2016  . Cardiomegaly 04/03/2016  . Diabetes, polyneuropathy ([place]) 04/03/2016  . Diabetic polyneuropathy ([place]) 04/03/2016  . Essential hypertension 04/03/2016  . Generalized OA 04/03/2016  . Hearing loss 04/03/2016  . High risk medication use 04/03/2016  . Hydrocele of testis 04/03/2016  . Hypogonadism in male 04/03/2016  . Obesity (BMI 30-39.9) 04/03/2016  . Peripheral neuropathy 04/03/2016  . Primary osteoarthritis involving multiple joints 04/03/2016  . RLS (restless legs syndrome) 04/03/2016  . Thoracic radiculopathy 04/03/2016  . Thoracic root lesion 04/03/2016  . Primary osteoarthritis of right knee 10/10/2015  . Restless legs syndrome 12/14/2013  . Dyspnea and respiratory abnormality 02/16/2013    Current Outpatient Prescriptions on File Prior to Visit  Medication Sig Dispense Refill  . amLODipine (NORVASC) 5 MG tablet Take 5 mg by mouth daily.    Marland Kitchen apixaban (ELIQUIS) 2.5 MG TABS tablet Take 1 tablet (2.5 mg total) by mouth every 12 (twelve) hours. 60 tablet 0  . azithromycin (ZITHROMAX) 250 MG tablet     . CLOBETASOL PROPIONATE E 0.05 % emollient cream     . cyclobenzaprine (FLEXERIL) 10 MG tablet Take 10 mg by mouth.    . docusate sodium (COLACE) 100 MG capsule Take 1 capsule (100 mg total) by mouth 2 (two) times daily. (Patient taking differently:  Take 100 mg by mouth 2 (two) times daily as needed for mild constipation. ) 60 capsule 3  . gabapentin (NEURONTIN) 600 MG tablet Take 1,200 mg by mouth 2 (two) times daily.     . Gabapentin Enacarbil 600 MG TBCR Take 1 pill with evening meal    . glimepiride (AMARYL) 1 MG tablet Take 1 mg by mouth daily with breakfast.    . glucose blood (FREESTYLE LITE) test strip USE 1 STRIP EVERY DAY    . hydrochlorothiazide (HYDRODIURIL) 25 MG tablet Take 25 mg by mouth daily.    Marland Kitchen HYDROcodone-acetaminophen (NORCO) 5-325 MG tablet Take 1-2 tablets by mouth every 4 (four) hours as needed for moderate pain. 90 tablet 0  . Lancets (FREESTYLE) lancets CHECK BLOOD SUGAR EVERY DAY AS DIRECTED    . latanoprost (XALATAN) 0.005 % ophthalmic solution     . latanoprost (XALATAN) 0.005 % ophthalmic solution Place 0.005 drops into both eyes daily.    Marland Kitchen lisinopril (PRINIVIL,ZESTRIL) 20 MG tablet Take 30 mg by mouth daily.    . metFORMIN (GLUCOPHAGE-XR) 500 MG 24 hr tablet Take 1,000 mg by mouth 2 (two) times daily.    . ondansetron (ZOFRAN) 4 MG tablet Take 1 tablet (4 mg total) by mouth every 6 (six) hours as needed for nausea. 20 tablet 0  . oxyCODONE (OXYCONTIN) 10 mg 12 hr tablet Take 1 tablet (10 mg total) by mouth PRO. 1 tab PO every 12 hours for 3 days, then 1 tab PO daily for 4 days 10 tablet 0  . pantoprazole (PROTONIX) 40 MG tablet Take 40 mg by mouth daily.    Marland Kitchen  rOPINIRole (REQUIP XL) 8 MG 24 hr tablet Take 8 mg by mouth at bedtime.    . rosuvastatin (CRESTOR) 10 MG tablet Take 10 mg by mouth.    . senna (SENOKOT) 8.6 MG TABS tablet Take 2 tablets (17.2 mg total) by mouth at bedtime. 60 each 3  . terbinafine (LAMISIL) 250 MG tablet      Current Facility-Administered Medications on File Prior to Visit  Medication Dose Route Frequency Provider Last Rate Last Dose  . triamcinolone acetonide (KENALOG-40) injection 20 mg  20 mg Other Once Landis Martins, DPM        Allergies  Allergen Reactions  . Aleve  [Naproxen Sodium] Hives  . Aspirin Other (See Comments)    Unsure Unknown "childhood allergy"  . Penicillins Other (See Comments)    Unsure All pt remembers is nose bleeds associated with this reaction.  Has patient had a PCN reaction causing immediate rash, facial/tongue/throat swelling, SOB or lightheadedness with hypotension: Unknown Has patient had a PCN reaction causing severe rash involving mucus membranes or skin necrosis: Unknown Has patient had a PCN reaction that required hospitalization: Yes Has patient had a PCN reaction occurring within the last 10 years: No If all of the above answers are "NO", then may proceed with Cepha  . Latex Rash    Objective:  General: Alert and oriented x3 in no acute distress  Dermatology: Mild reactive callusing right foot styloid process and left foot sub-met 5 with mild surrounding blanchable erythema suggestive of irritation and rubbing in shoes. No open lesions bilateral lower extremities, no webspace macerations, no ecchymosis bilateral, all nails x 10 are well manicured.  Vascular: Dorsalis Pedis and Posterior Tibial pedal pulses palpable, Capillary Fill Time 3 seconds,(+) pedal hair growth bilateral, no edema bilateral lower extremities, Temperature gradient within normal limits.  Neurology: Johney Maine sensation intact via light touch bilateral. (- )Tinels sign bilateral.   Musculoskeletal: Mild tenderness with palpation at fifth metatarsal head on left at area of tailors bunion and styloid process on right foot, cavovarus foot type. No pain with calf compression bilateral. There is decreased range of motion bilateral with history significant for arthritis with fixed varus ankle. Strength within normal limits in all groups bilateral.   Assessment and Plan: Problem List Items Addressed This Visit    None    Visit Diagnoses    Capsulitis    -  Primary   Relevant Medications   triamcinolone acetonide (KENALOG) 10 MG/ML injection 10 mg (Start on  03/01/2017  1:30 PM)   Tailor's bunion of left foot       Relevant Medications   triamcinolone acetonide (KENALOG) 10 MG/ML injection 10 mg (Start on 03/01/2017  1:30 PM)   Prominent metatarsal head, unspecified laterality       Relevant Medications   triamcinolone acetonide (KENALOG) 10 MG/ML injection 10 mg (Start on 03/01/2017  1:30 PM)   Cavus deformity of foot, acquired       Relevant Medications   triamcinolone acetonide (KENALOG) 10 MG/ML injection 10 mg (Start on 03/01/2017  1:30 PM)       -Complete examination performed -Discussed treatement optionsFor bony prominences with surrounding local inflammation secondary to foot type with significant underlying arthritis -Patient does not want surgery at this time; Advised patient that without surgery that this will continue to occur because of fixed ankle position - After oral consent and aseptic prep, injected a mixture containing 1 ml of 2%  plain lidocaine, 1 ml 0.5% plain marcaine, 0.5 ml  of kenalog 10 and 0.5 ml of dexamethasone phosphate into left 5th MTPJ without complication. Post-injection care discussed with patient.  -Applied more aggressive offloading padding sub 5 on left -Recommend icing daily until symptoms improve -Patient to return to office as needed or sooner if condition worsens.  Landis Martins, DPM

## 2017-03-04 DIAGNOSIS — M9902 Segmental and somatic dysfunction of thoracic region: Secondary | ICD-10-CM | POA: Diagnosis not present

## 2017-03-04 DIAGNOSIS — M545 Low back pain: Secondary | ICD-10-CM | POA: Diagnosis not present

## 2017-03-04 DIAGNOSIS — M9903 Segmental and somatic dysfunction of lumbar region: Secondary | ICD-10-CM | POA: Diagnosis not present

## 2017-03-04 DIAGNOSIS — M9905 Segmental and somatic dysfunction of pelvic region: Secondary | ICD-10-CM | POA: Diagnosis not present

## 2017-03-09 DIAGNOSIS — G4733 Obstructive sleep apnea (adult) (pediatric): Secondary | ICD-10-CM | POA: Diagnosis not present

## 2017-03-11 DIAGNOSIS — M9902 Segmental and somatic dysfunction of thoracic region: Secondary | ICD-10-CM | POA: Diagnosis not present

## 2017-03-11 DIAGNOSIS — M9905 Segmental and somatic dysfunction of pelvic region: Secondary | ICD-10-CM | POA: Diagnosis not present

## 2017-03-11 DIAGNOSIS — M9903 Segmental and somatic dysfunction of lumbar region: Secondary | ICD-10-CM | POA: Diagnosis not present

## 2017-03-11 DIAGNOSIS — M545 Low back pain: Secondary | ICD-10-CM | POA: Diagnosis not present

## 2017-03-20 DIAGNOSIS — I10 Essential (primary) hypertension: Secondary | ICD-10-CM | POA: Diagnosis not present

## 2017-03-20 DIAGNOSIS — H401131 Primary open-angle glaucoma, bilateral, mild stage: Secondary | ICD-10-CM | POA: Diagnosis not present

## 2017-03-20 DIAGNOSIS — H47233 Glaucomatous optic atrophy, bilateral: Secondary | ICD-10-CM | POA: Diagnosis not present

## 2017-04-08 DIAGNOSIS — G4733 Obstructive sleep apnea (adult) (pediatric): Secondary | ICD-10-CM | POA: Diagnosis not present

## 2017-05-09 DIAGNOSIS — G4733 Obstructive sleep apnea (adult) (pediatric): Secondary | ICD-10-CM | POA: Diagnosis not present

## 2017-05-28 DIAGNOSIS — G4733 Obstructive sleep apnea (adult) (pediatric): Secondary | ICD-10-CM | POA: Diagnosis not present

## 2017-06-08 DIAGNOSIS — G4733 Obstructive sleep apnea (adult) (pediatric): Secondary | ICD-10-CM | POA: Diagnosis not present

## 2017-06-12 ENCOUNTER — Ambulatory Visit: Payer: PPO | Admitting: Sports Medicine

## 2017-06-12 DIAGNOSIS — M216X9 Other acquired deformities of unspecified foot: Secondary | ICD-10-CM | POA: Diagnosis not present

## 2017-06-12 DIAGNOSIS — M21622 Bunionette of left foot: Secondary | ICD-10-CM

## 2017-06-12 DIAGNOSIS — M779 Enthesopathy, unspecified: Secondary | ICD-10-CM | POA: Diagnosis not present

## 2017-06-12 DIAGNOSIS — M21962 Unspecified acquired deformity of left lower leg: Secondary | ICD-10-CM

## 2017-06-12 NOTE — Progress Notes (Signed)
Subjective: Samuel Hodge is a 66 y.o. male patient who returns to office for evaluation of left foot pain.Patient states still having pain on left 5th MTPJ. States that over the last week has gotten very inflamed and red at the end of the day. Patient denies any other pedal complaints.  Patient Active Problem List   Diagnosis Date Noted  . Vitamin B12 deficiency 12/13/2016  . Chronic gout of multiple sites 04/04/2016  . Chronic gouty arthritis 04/04/2016  . Gastroesophageal reflux disease without esophagitis 04/04/2016  . Mixed hyperlipidemia 04/04/2016  . Multiple-type hyperlipidemia 04/04/2016  . Benign neoplasm of colon 04/03/2016  . Cardiac arrhythmia 04/03/2016  . Cardiac enlargement 04/03/2016  . Cardiomegaly 04/03/2016  . Diabetes, polyneuropathy ([place]) 04/03/2016  . Diabetic polyneuropathy ([place]) 04/03/2016  . Essential hypertension 04/03/2016  . Generalized OA 04/03/2016  . Hearing loss 04/03/2016  . High risk medication use 04/03/2016  . Hydrocele of testis 04/03/2016  . Hypogonadism in male 04/03/2016  . Obesity (BMI 30-39.9) 04/03/2016  . Peripheral neuropathy 04/03/2016  . Primary osteoarthritis involving multiple joints 04/03/2016  . RLS (restless legs syndrome) 04/03/2016  . Thoracic radiculopathy 04/03/2016  . Thoracic root lesion 04/03/2016  . Primary osteoarthritis of right knee 10/10/2015  . Restless legs syndrome 12/14/2013  . Dyspnea and respiratory abnormality 02/16/2013    Current Outpatient Prescriptions on File Prior to Visit  Medication Sig Dispense Refill  . amLODipine (NORVASC) 5 MG tablet Take 5 mg by mouth daily.    Marland Kitchen apixaban (ELIQUIS) 2.5 MG TABS tablet Take 1 tablet (2.5 mg total) by mouth every 12 (twelve) hours. 60 tablet 0  . azithromycin (ZITHROMAX) 250 MG tablet     . CLOBETASOL PROPIONATE E 0.05 % emollient cream     . cyclobenzaprine (FLEXERIL) 10 MG tablet Take 10 mg by mouth.    . docusate sodium (COLACE) 100 MG capsule Take 1  capsule (100 mg total) by mouth 2 (two) times daily. (Patient taking differently: Take 100 mg by mouth 2 (two) times daily as needed for mild constipation. ) 60 capsule 3  . gabapentin (NEURONTIN) 600 MG tablet Take 1,200 mg by mouth 2 (two) times daily.     . Gabapentin Enacarbil 600 MG TBCR Take 1 pill with evening meal    . glimepiride (AMARYL) 1 MG tablet Take 1 mg by mouth daily with breakfast.    . glucose blood (FREESTYLE LITE) test strip USE 1 STRIP EVERY DAY    . hydrochlorothiazide (HYDRODIURIL) 25 MG tablet Take 25 mg by mouth daily.    Marland Kitchen HYDROcodone-acetaminophen (NORCO) 5-325 MG tablet Take 1-2 tablets by mouth every 4 (four) hours as needed for moderate pain. 90 tablet 0  . Lancets (FREESTYLE) lancets CHECK BLOOD SUGAR EVERY DAY AS DIRECTED    . latanoprost (XALATAN) 0.005 % ophthalmic solution     . latanoprost (XALATAN) 0.005 % ophthalmic solution Place 0.005 drops into both eyes daily.    Marland Kitchen lisinopril (PRINIVIL,ZESTRIL) 20 MG tablet Take 30 mg by mouth daily.    . metFORMIN (GLUCOPHAGE-XR) 500 MG 24 hr tablet Take 1,000 mg by mouth 2 (two) times daily.    . ondansetron (ZOFRAN) 4 MG tablet Take 1 tablet (4 mg total) by mouth every 6 (six) hours as needed for nausea. 20 tablet 0  . oxyCODONE (OXYCONTIN) 10 mg 12 hr tablet Take 1 tablet (10 mg total) by mouth PRO. 1 tab PO every 12 hours for 3 days, then 1 tab PO daily for 4 days  10 tablet 0  . pantoprazole (PROTONIX) 40 MG tablet Take 40 mg by mouth daily.    Marland Kitchen rOPINIRole (REQUIP XL) 8 MG 24 hr tablet Take 8 mg by mouth at bedtime.    . rosuvastatin (CRESTOR) 10 MG tablet Take 10 mg by mouth.    . senna (SENOKOT) 8.6 MG TABS tablet Take 2 tablets (17.2 mg total) by mouth at bedtime. 60 each 3  . terbinafine (LAMISIL) 250 MG tablet      Current Facility-Administered Medications on File Prior to Visit  Medication Dose Route Frequency Provider Last Rate Last Dose  . triamcinolone acetonide (KENALOG) 10 MG/ML injection 10 mg  10 mg  Other Once [place], Wilma [place]uth[place], DPM      . triamcinolone acetonide (KENALOG-40) injection 20 mg  20 mg Other Once Landis Martins, DPM        Allergies  Allergen Reactions  . Aleve [Naproxen Sodium] Hives  . Aspirin Other (See Comments)    Unsure Unknown "childhood allergy"  . Penicillins Other (See Comments)    Unsure All pt remembers is nose bleeds associated with this reaction.  Has patient had a PCN reaction causing immediate rash, facial/tongue/throat swelling, SOB or lightheadedness with hypotension: Unknown Has patient had a PCN reaction causing severe rash involving mucus membranes or skin necrosis: Unknown Has patient had a PCN reaction that required hospitalization: Yes Has patient had a PCN reaction occurring within the last 10 years: No If all of the above answers are "NO", then may proceed with Cepha  . Latex Rash    Objective:  General: Alert and oriented x3 in no acute distress  Dermatology: Mild reactive callusing right foot styloid process and left foot sub-met 5 with mild surrounding blanchable erythema suggestive of irritation and rubbing in shoes. No open lesions bilateral lower extremities, no webspace macerations, no ecchymosis bilateral, all nails x 10 are well manicured.  Vascular: Dorsalis Pedis and Posterior Tibial pedal pulses palpable, Capillary Fill Time 3 seconds,(+) pedal hair growth bilateral, no edema bilateral lower extremities, Temperature gradient within normal limits.  Neurology: Johney Maine sensation intact via light touch bilateral. (- )Tinels sign bilateral.   Musculoskeletal: Mild tenderness with palpation at fifth metatarsal head on left at area of tailors bunion and styloid process on right foot, cavovarus foot type. No pain with calf compression bilateral. There is decreased range of motion bilateral with history significant for arthritis with fixed varus ankleOn left that is contributed to supinated position of foot and capsulitis of the fifth  metatarsophalangeal joint. Strength within normal limits in all groups bilateral.   Assessment and Plan: Problem List Items Addressed This Visit    None    Visit Diagnoses    Capsulitis    -  Primary   Tailor's bunion of left foot       Prominent metatarsal head, unspecified laterality       Cavus deformity of foot, acquired       Ankle deformity, left           -Complete examination performed -Discussed treatement optionsFor bony prominences with surrounding local inflammation secondary to foot type with significant underlying arthritis -Patient does not want ankle surgery at this time but does elect to have surgery on the foot at the inflamed metatarsal bone, Patient is well aware that in the long-term. He will need his ankle, taking care of because as the arthritis developed, and as his foot sits in the fixed position. He can get reoccurrence of symptoms If his ankle  is not taking care of. - After oral consent and aseptic prep, injected a mixture containing 1 ml of 2%  plain lidocaine, 1 ml 0.5% plain marcaine, 0.5 ml of kenalog 10 and 0.5 ml of dexamethasone phosphate into left 5th MTPJ without complication. Post-injection care discussed with patient.  -Continue with offloading padding sub 5 on left -Recommend icing daily until symptoms improve -Patient opt for surgical management. Consent obtained for left fifth metatarsal head resection. Pre and Post op course explained. Risks, benefits, alternatives explained. No guarantees given or implied. Surgical booking slip submitted and provided patient with Surgical packet and info for [facility] History and physical form given. -Dispensed surgical shoe to use post op. -Patient to return to office after surgery/as needed or sooner if condition worsens.  Landis Martins, DPM

## 2017-06-12 NOTE — Patient Instructions (Signed)
Pre-Operative Instructions  Congratulations, you have decided to take an important step towards improving your quality of life.  You can be assured that the doctors and staff at [facility] will be with you every step of the way.  Here are some important things you should know:  1. Plan to be at the surgery center/hospital at least 1 (one) hour prior to your scheduled time, unless otherwise directed by the surgical center/hospital staff.  You must have a responsible adult accompany you, remain during the surgery and drive you home.  Make sure you have directions to the surgical center/hospital to ensure you arrive on time. 2. If you are having surgery at [facility] or [facility], you will need a copy of your medical history and physical form from your family physician within one month prior to the date of surgery. We will give you a form for your primary physician to complete.  3. We make every effort to accommodate the date you request for surgery.  However, there are times where surgery dates or times have to be moved.  We will contact you as soon as possible if a change in schedule is required.   4. No aspirin/ibuprofen for one week before surgery.  If you are on aspirin, any non-steroidal anti-inflammatory medications (Mobic, Aleve, Ibuprofen) should not be taken seven (7) days prior to your surgery.  You make take Tylenol for pain prior to surgery.  5. Medications - If you are taking daily heart and blood pressure medications, seizure, reflux, allergy, asthma, anxiety, pain or diabetes medications, make sure you notify the surgery center/hospital before the day of surgery so they can tell you which medications you should take or avoid the day of surgery. 6. No food or drink after midnight the night before surgery unless directed otherwise by surgical center/hospital staff. 7. No alcoholic beverages 24-hours prior to surgery.  No smoking 24-hours prior or 24-hours after  surgery. 8. Wear loose pants or shorts. They should be loose enough to fit over bandages, boots, and casts. 9. Don't wear slip-on shoes. Sneakers are preferred. 10. Bring your boot with you to the surgery center/hospital.  Also bring crutches or a walker if your physician has prescribed it for you.  If you do not have this equipment, it will be provided for you after surgery. 11. If you have not been contacted by the surgery center/hospital by the day before your surgery, call to confirm the date and time of your surgery. 12. Leave-time from work may vary depending on the type of surgery you have.  Appropriate arrangements should be made prior to surgery with your employer. 13. Prescriptions will be provided immediately following surgery by your doctor.  Fill these as soon as possible after surgery and take the medication as directed. Pain medications will not be refilled on weekends and must be approved by the doctor. 14. Remove nail polish on the operative foot and avoid getting pedicures prior to surgery. 15. Wash the night before surgery.  The night before surgery wash the foot and leg well with water and the antibacterial soap provided. Be sure to pay special attention to beneath the toenails and in between the toes.  Wash for at least three (3) minutes. Rinse thoroughly with water and dry well with a towel.  Perform this wash unless told not to do so by your physician.  Enclosed: 1 Ice pack (please put in freezer the night before surgery)   1 Hibiclens skin cleaner     Pre-op instructions  If you have any questions regarding the instructions, please do not hesitate to call our office.  [place]: [address], [place], [place] [address] -- [phone]  [place]: [address] [place], [place] [address] -- [phone]  [place]: [address]  [place], [place] [address] -- [phone]  [place]: [address], [address], [place], [place] [address] -- [phone]  Website:  [email or web address] 

## 2017-06-19 DIAGNOSIS — Z125 Encounter for screening for malignant neoplasm of prostate: Secondary | ICD-10-CM | POA: Diagnosis not present

## 2017-06-19 DIAGNOSIS — E538 Deficiency of other specified B group vitamins: Secondary | ICD-10-CM | POA: Diagnosis not present

## 2017-06-19 DIAGNOSIS — I10 Essential (primary) hypertension: Secondary | ICD-10-CM | POA: Diagnosis not present

## 2017-06-19 DIAGNOSIS — E1142 Type 2 diabetes mellitus with diabetic polyneuropathy: Secondary | ICD-10-CM | POA: Diagnosis not present

## 2017-06-19 DIAGNOSIS — E782 Mixed hyperlipidemia: Secondary | ICD-10-CM | POA: Diagnosis not present

## 2017-06-19 DIAGNOSIS — K219 Gastro-esophageal reflux disease without esophagitis: Secondary | ICD-10-CM | POA: Diagnosis not present

## 2017-06-19 DIAGNOSIS — Z0000 Encounter for general adult medical examination without abnormal findings: Secondary | ICD-10-CM | POA: Diagnosis not present

## 2017-06-19 DIAGNOSIS — D126 Benign neoplasm of colon, unspecified: Secondary | ICD-10-CM | POA: Diagnosis not present

## 2017-06-20 ENCOUNTER — Telehealth: Payer: Self-pay | Admitting: *Deleted

## 2017-06-20 NOTE — Telephone Encounter (Signed)
06/12/2017 "I need to schedule surgery."  08//07/18 "I called last week, you were on vacation, about scheduling surgery with Dr. Cannon Kettle.  Give me a call back."  06/19/2017 I attempted to call patient.  I couldn't leave a message because voicemail had not been set up.  06/20/2017 "I been trying to call you to set up surgery."  I tried calling you yesterday.  "My phone has been acting up lately."  Dr. Leeanne Rio first available is September 13.  "Let me check my schedule, that date looks fine.  What time will I need to be there?"  Someone from the surgical center will call you with the arrival time a day or two prior to surgery date.  "I had my physical done yesterday."  It will be expired by the time you have surgery.  You may want to take it back to your doctor on Monday and see if they can re-evaluate and change the date of the physical.  "Okay, I will do that."

## 2017-06-25 DIAGNOSIS — M79605 Pain in left leg: Secondary | ICD-10-CM | POA: Diagnosis not present

## 2017-06-25 DIAGNOSIS — M79662 Pain in left lower leg: Secondary | ICD-10-CM | POA: Diagnosis not present

## 2017-06-25 DIAGNOSIS — M25562 Pain in left knee: Secondary | ICD-10-CM | POA: Diagnosis not present

## 2017-06-25 DIAGNOSIS — M79652 Pain in left thigh: Secondary | ICD-10-CM | POA: Diagnosis not present

## 2017-06-27 ENCOUNTER — Telehealth: Payer: Self-pay | Admitting: *Deleted

## 2017-06-27 DIAGNOSIS — Z79899 Other long term (current) drug therapy: Secondary | ICD-10-CM | POA: Diagnosis not present

## 2017-06-27 NOTE — Telephone Encounter (Addendum)
"  You scheduled my surgery for September 13.  I was calling to see if the physical had arrived there and that everything is in order with the insurance.  Give me a call back."  (Melody do you have the physical?  If so can you email it to me please?)

## 2017-06-28 DIAGNOSIS — E1142 Type 2 diabetes mellitus with diabetic polyneuropathy: Secondary | ICD-10-CM | POA: Diagnosis not present

## 2017-06-28 DIAGNOSIS — S76312D Strain of muscle, fascia and tendon of the posterior muscle group at thigh level, left thigh, subsequent encounter: Secondary | ICD-10-CM | POA: Diagnosis not present

## 2017-06-28 DIAGNOSIS — S76312A Strain of muscle, fascia and tendon of the posterior muscle group at thigh level, left thigh, initial encounter: Secondary | ICD-10-CM | POA: Insufficient documentation

## 2017-06-28 DIAGNOSIS — Z79899 Other long term (current) drug therapy: Secondary | ICD-10-CM | POA: Diagnosis not present

## 2017-07-03 NOTE — Telephone Encounter (Signed)
As of right now I have not received the physical paperwork

## 2017-07-04 NOTE — Telephone Encounter (Signed)
I left patient a message that we have not received the history and physical form from his primary care doctor.  I advised him to follow up  also informed him that I am still processing the surgery.

## 2017-07-05 ENCOUNTER — Telehealth: Payer: Self-pay | Admitting: *Deleted

## 2017-07-05 NOTE — Telephone Encounter (Signed)
I am calling to let you know we received the authorization from [place].  I left you a message yesterday that we have not received the history and physical form from your primary care doctor.  "I went by your office in [place] and got another form.  I took it to my doctor's office to have it filled out and then I took it back to your office this morning and gave it to [place].  They said they would send you a copy of it."  Thank you so much, that is all we needed.  [employer] authorization number is [id number] and it is valid from 07/25/2017 - 10/23/2017.

## 2017-07-09 DIAGNOSIS — G4733 Obstructive sleep apnea (adult) (pediatric): Secondary | ICD-10-CM | POA: Diagnosis not present

## 2017-07-24 ENCOUNTER — Telehealth: Payer: Self-pay | Admitting: *Deleted

## 2017-07-24 NOTE — Telephone Encounter (Signed)
"  I'm scheduled for surgery tomorrow.  I was calling in regards to my surgery scheduled for tomorrow.  I am calling to see if you ever received my history and physical form."  Yes, I did and I faxed it to Ms. Blanch Media at [facility].  Dr. Cannon Kettle has a copy of it in your chart.  "I called Dr. Leeanne Rio office and they said they did not have it."  It's in the chart, they just weren't aware.  Everything is okay.  "Thanks so much for your help."

## 2017-07-25 ENCOUNTER — Encounter: Payer: Self-pay | Admitting: Sports Medicine

## 2017-07-25 DIAGNOSIS — Z79899 Other long term (current) drug therapy: Secondary | ICD-10-CM | POA: Diagnosis not present

## 2017-07-25 DIAGNOSIS — E1142 Type 2 diabetes mellitus with diabetic polyneuropathy: Secondary | ICD-10-CM | POA: Diagnosis not present

## 2017-07-25 DIAGNOSIS — M7741 Metatarsalgia, right foot: Secondary | ICD-10-CM | POA: Diagnosis not present

## 2017-07-25 DIAGNOSIS — E669 Obesity, unspecified: Secondary | ICD-10-CM | POA: Diagnosis not present

## 2017-07-25 DIAGNOSIS — E538 Deficiency of other specified B group vitamins: Secondary | ICD-10-CM | POA: Diagnosis not present

## 2017-07-25 DIAGNOSIS — Z6839 Body mass index (BMI) 39.0-39.9, adult: Secondary | ICD-10-CM | POA: Diagnosis not present

## 2017-07-25 DIAGNOSIS — M21622 Bunionette of left foot: Secondary | ICD-10-CM | POA: Diagnosis not present

## 2017-07-25 DIAGNOSIS — M1A09X0 Idiopathic chronic gout, multiple sites, without tophus (tophi): Secondary | ICD-10-CM | POA: Diagnosis not present

## 2017-07-25 DIAGNOSIS — G4730 Sleep apnea, unspecified: Secondary | ICD-10-CM | POA: Diagnosis not present

## 2017-07-25 DIAGNOSIS — M779 Enthesopathy, unspecified: Secondary | ICD-10-CM | POA: Diagnosis not present

## 2017-07-25 DIAGNOSIS — M7752 Other enthesopathy of left foot: Secondary | ICD-10-CM | POA: Diagnosis not present

## 2017-07-25 DIAGNOSIS — Z7984 Long term (current) use of oral hypoglycemic drugs: Secondary | ICD-10-CM | POA: Diagnosis not present

## 2017-07-25 DIAGNOSIS — M216X9 Other acquired deformities of unspecified foot: Secondary | ICD-10-CM | POA: Diagnosis not present

## 2017-07-25 DIAGNOSIS — G2581 Restless legs syndrome: Secondary | ICD-10-CM | POA: Diagnosis not present

## 2017-07-25 DIAGNOSIS — K219 Gastro-esophageal reflux disease without esophagitis: Secondary | ICD-10-CM | POA: Diagnosis not present

## 2017-07-25 DIAGNOSIS — I10 Essential (primary) hypertension: Secondary | ICD-10-CM | POA: Diagnosis not present

## 2017-07-25 DIAGNOSIS — E782 Mixed hyperlipidemia: Secondary | ICD-10-CM | POA: Diagnosis not present

## 2017-07-31 ENCOUNTER — Ambulatory Visit (INDEPENDENT_AMBULATORY_CARE_PROVIDER_SITE_OTHER): Payer: PPO

## 2017-07-31 ENCOUNTER — Ambulatory Visit (INDEPENDENT_AMBULATORY_CARE_PROVIDER_SITE_OTHER): Payer: PPO | Admitting: Sports Medicine

## 2017-07-31 DIAGNOSIS — M216X9 Other acquired deformities of unspecified foot: Secondary | ICD-10-CM | POA: Diagnosis not present

## 2017-07-31 DIAGNOSIS — Z9889 Other specified postprocedural states: Secondary | ICD-10-CM

## 2017-07-31 DIAGNOSIS — M216X2 Other acquired deformities of left foot: Secondary | ICD-10-CM

## 2017-07-31 DIAGNOSIS — M779 Enthesopathy, unspecified: Secondary | ICD-10-CM

## 2017-07-31 DIAGNOSIS — M21962 Unspecified acquired deformity of left lower leg: Secondary | ICD-10-CM

## 2017-07-31 DIAGNOSIS — M79672 Pain in left foot: Secondary | ICD-10-CM

## 2017-07-31 NOTE — Progress Notes (Signed)
DOS 07/25/2017 Remove Inflamed bone at 5th Metatarsal LT

## 2017-07-31 NOTE — Progress Notes (Signed)
Subjective: Samuel Hodge is a 66 y.o. male patient seen today in office for POV #1 (DOS 07-25-17), S/P Left 5th metatarsal head resection. Patient denies pain at surgical site, denies calf pain, denies headache, chest pain, shortness of breath, nausea, vomiting, fever, or chills. Patient states that he is doing well, only had to take a few of Norco pain pills. No other issues noted.   Patient Active Problem List   Diagnosis Date Noted  . Vitamin B12 deficiency 12/13/2016  . Chronic gout of multiple sites 04/04/2016  . Chronic gouty arthritis 04/04/2016  . Gastroesophageal reflux disease without esophagitis 04/04/2016  . Mixed hyperlipidemia 04/04/2016  . Multiple-type hyperlipidemia 04/04/2016  . Benign neoplasm of colon 04/03/2016  . Cardiac arrhythmia 04/03/2016  . Cardiac enlargement 04/03/2016  . Cardiomegaly 04/03/2016  . Diabetes, polyneuropathy ([place]) 04/03/2016  . Diabetic polyneuropathy ([place]) 04/03/2016  . Essential hypertension 04/03/2016  . Generalized OA 04/03/2016  . Hearing loss 04/03/2016  . High risk medication use 04/03/2016  . Hydrocele of testis 04/03/2016  . Hypogonadism in male 04/03/2016  . Obesity (BMI 30-39.9) 04/03/2016  . Peripheral neuropathy 04/03/2016  . Primary osteoarthritis involving multiple joints 04/03/2016  . RLS (restless legs syndrome) 04/03/2016  . Thoracic radiculopathy 04/03/2016  . Thoracic root lesion 04/03/2016  . Primary osteoarthritis of right knee 10/10/2015  . Restless legs syndrome 12/14/2013  . Dyspnea and respiratory abnormality 02/16/2013    Current Outpatient Prescriptions on File Prior to Visit  Medication Sig Dispense Refill  . amLODipine (NORVASC) 5 MG tablet Take 5 mg by mouth daily.    Marland Kitchen apixaban (ELIQUIS) 2.5 MG TABS tablet Take 1 tablet (2.5 mg total) by mouth every 12 (twelve) hours. 60 tablet 0  . azithromycin (ZITHROMAX) 250 MG tablet     . CLOBETASOL PROPIONATE E 0.05 % emollient cream     . cyclobenzaprine  (FLEXERIL) 10 MG tablet Take 10 mg by mouth.    . docusate sodium (COLACE) 100 MG capsule Take 1 capsule (100 mg total) by mouth 2 (two) times daily. (Patient taking differently: Take 100 mg by mouth 2 (two) times daily as needed for mild constipation. ) 60 capsule 3  . gabapentin (NEURONTIN) 600 MG tablet Take 1,200 mg by mouth 2 (two) times daily.     . Gabapentin Enacarbil 600 MG TBCR Take 1 pill with evening meal    . glimepiride (AMARYL) 1 MG tablet Take 1 mg by mouth daily with breakfast.    . glucose blood (FREESTYLE LITE) test strip USE 1 STRIP EVERY DAY    . hydrochlorothiazide (HYDRODIURIL) 25 MG tablet Take 25 mg by mouth daily.    Marland Kitchen HYDROcodone-acetaminophen (NORCO) 10-325 MG tablet Take 1 tablet by mouth every 6 (six) hours as needed.    Marland Kitchen HYDROcodone-acetaminophen (NORCO) 5-325 MG tablet Take 1-2 tablets by mouth every 4 (four) hours as needed for moderate pain. 90 tablet 0  . Lancets (FREESTYLE) lancets CHECK BLOOD SUGAR EVERY DAY AS DIRECTED    . latanoprost (XALATAN) 0.005 % ophthalmic solution     . latanoprost (XALATAN) 0.005 % ophthalmic solution Place 0.005 drops into both eyes daily.    Marland Kitchen lisinopril (PRINIVIL,ZESTRIL) 20 MG tablet Take 30 mg by mouth daily.    . metFORMIN (GLUCOPHAGE-XR) 500 MG 24 hr tablet Take 1,000 mg by mouth 2 (two) times daily.    . ondansetron (ZOFRAN) 4 MG tablet Take 1 tablet (4 mg total) by mouth every 6 (six) hours as needed for nausea. 20 tablet  0  . oxyCODONE (OXYCONTIN) 10 mg 12 hr tablet Take 1 tablet (10 mg total) by mouth PRO. 1 tab PO every 12 hours for 3 days, then 1 tab PO daily for 4 days 10 tablet 0  . pantoprazole (PROTONIX) 40 MG tablet Take 40 mg by mouth daily.    . promethazine (PHENERGAN) 25 MG tablet Take 25 mg by mouth every 6 (six) hours as needed for nausea or vomiting.    Marland Kitchen rOPINIRole (REQUIP XL) 8 MG 24 hr tablet Take 8 mg by mouth at bedtime.    . rosuvastatin (CRESTOR) 10 MG tablet Take 10 mg by mouth.    . senna (SENOKOT)  8.6 MG TABS tablet Take 2 tablets (17.2 mg total) by mouth at bedtime. 60 each 3  . terbinafine (LAMISIL) 250 MG tablet      Current Facility-Administered Medications on File Prior to Visit  Medication Dose Route Frequency Provider Last Rate Last Dose  . triamcinolone acetonide (KENALOG) 10 MG/ML injection 10 mg  10 mg Other Once [place], Masin Shatto, DPM      . triamcinolone acetonide (KENALOG-40) injection 20 mg  20 mg Other Once Landis Martins, DPM        Allergies  Allergen Reactions  . Aleve [Naproxen Sodium] Hives  . Aspirin Other (See Comments)    Unsure Unknown "childhood allergy"  . Penicillins Other (See Comments)    Unsure All pt remembers is nose bleeds associated with this reaction.  Has patient had a PCN reaction causing immediate rash, facial/tongue/throat swelling, SOB or lightheadedness with hypotension: Unknown Has patient had a PCN reaction causing severe rash involving mucus membranes or skin necrosis: Unknown Has patient had a PCN reaction that required hospitalization: Yes Has patient had a PCN reaction occurring within the last 10 years: No If all of the above answers are "NO", then may proceed with Cepha  . Latex Rash    Objective: There were no vitals filed for this visit.  General: No acute distress, AAOx3  Left foot: Sutures intact with no gapping or dehiscence at surgical site, mild swelling to left foot, no erythema, no warmth, no drainage, no signs of infection noted, Capillary fill time <3 seconds in all digits, gross sensation present via light touch to left foot. No pain or crepitation with range of motion left foot. Unchanged cavus and fixed varus ankle deformity on left.  No pain with calf compression.   Post Op Xray, Left foot: 5th met head resection, Soft tissue swelling within normal limits for post op status.   Assessment and Plan:  Problem List Items Addressed This Visit    None    Visit Diagnoses    S/P foot surgery, left    -  Primary    Prominent metatarsal head of left foot       Relevant Orders   DG Foot Complete Left   Capsulitis       Left foot pain       Cavus deformity of foot, acquired       Ankle deformity, left           -Patient seen and evaluated -Xrays reviewed  -Applied dry sterile dressing to surgical site left foot secured with ACE wrap and stockinet  -Advised patient to make sure to keep dressings clean, dry, and intact to left surgical site, removing the ACE as needed  -Advised patient to continue with post-op shoe on left and cane on left   -Advised patient to limit activity to  necessity  -Advised patient to ice and elevate as necessary  -Will plan for possible suture removal at next office visit. In the meantime, patient to call office if any issues or problems arise.   Landis Martins, DPM

## 2017-08-07 ENCOUNTER — Ambulatory Visit (INDEPENDENT_AMBULATORY_CARE_PROVIDER_SITE_OTHER): Payer: PPO | Admitting: Sports Medicine

## 2017-08-07 DIAGNOSIS — M216X2 Other acquired deformities of left foot: Secondary | ICD-10-CM

## 2017-08-07 DIAGNOSIS — M779 Enthesopathy, unspecified: Secondary | ICD-10-CM

## 2017-08-07 DIAGNOSIS — M79672 Pain in left foot: Secondary | ICD-10-CM

## 2017-08-07 DIAGNOSIS — Z9889 Other specified postprocedural states: Secondary | ICD-10-CM

## 2017-08-07 NOTE — Progress Notes (Signed)
Subjective: Samuel Hodge is a 66 y.o. male patient seen today in office for POV #2  (DOS 07-25-17), S/P Left 5th metatarsal head resection. Patient denies pain at surgical site, denies calf pain, denies headache, chest pain, shortness of breath, nausea, vomiting, fever, or chills. Patient states that he is doing well, and is ready for stitches to come out. No other issues noted.   Patient Active Problem List   Diagnosis Date Noted  . Vitamin B12 deficiency 12/13/2016  . Chronic gout of multiple sites 04/04/2016  . Chronic gouty arthritis 04/04/2016  . Gastroesophageal reflux disease without esophagitis 04/04/2016  . Mixed hyperlipidemia 04/04/2016  . Multiple-type hyperlipidemia 04/04/2016  . Benign neoplasm of colon 04/03/2016  . Cardiac arrhythmia 04/03/2016  . Cardiac enlargement 04/03/2016  . Cardiomegaly 04/03/2016  . Diabetes, polyneuropathy ([place]) 04/03/2016  . Diabetic polyneuropathy ([employer]) 04/03/2016  . Essential hypertension 04/03/2016  . Generalized OA 04/03/2016  . Hearing loss 04/03/2016  . High risk medication use 04/03/2016  . Hydrocele of testis 04/03/2016  . Hypogonadism in male 04/03/2016  . Obesity (BMI 30-39.9) 04/03/2016  . Peripheral neuropathy 04/03/2016  . Primary osteoarthritis involving multiple joints 04/03/2016  . RLS (restless legs syndrome) 04/03/2016  . Thoracic radiculopathy 04/03/2016  . Thoracic root lesion 04/03/2016  . Primary osteoarthritis of right knee 10/10/2015  . Restless legs syndrome 12/14/2013  . Dyspnea and respiratory abnormality 02/16/2013    Current Outpatient Prescriptions on File Prior to Visit  Medication Sig Dispense Refill  . amLODipine (NORVASC) 5 MG tablet Take 5 mg by mouth daily.    Marland Kitchen apixaban (ELIQUIS) 2.5 MG TABS tablet Take 1 tablet (2.5 mg total) by mouth every 12 (twelve) hours. 60 tablet 0  . azithromycin (ZITHROMAX) 250 MG tablet     . CLOBETASOL PROPIONATE E 0.05 % emollient cream     . cyclobenzaprine  (FLEXERIL) 10 MG tablet Take 10 mg by mouth.    . docusate sodium (COLACE) 100 MG capsule Take 1 capsule (100 mg total) by mouth 2 (two) times daily. (Patient taking differently: Take 100 mg by mouth 2 (two) times daily as needed for mild constipation. ) 60 capsule 3  . gabapentin (NEURONTIN) 600 MG tablet Take 1,200 mg by mouth 2 (two) times daily.     . Gabapentin Enacarbil 600 MG TBCR Take 1 pill with evening meal    . glimepiride (AMARYL) 1 MG tablet Take 1 mg by mouth daily with breakfast.    . glucose blood (FREESTYLE LITE) test strip USE 1 STRIP EVERY DAY    . hydrochlorothiazide (HYDRODIURIL) 25 MG tablet Take 25 mg by mouth daily.    Marland Kitchen HYDROcodone-acetaminophen (NORCO) 10-325 MG tablet Take 1 tablet by mouth every 6 (six) hours as needed.    Marland Kitchen HYDROcodone-acetaminophen (NORCO) 5-325 MG tablet Take 1-2 tablets by mouth every 4 (four) hours as needed for moderate pain. 90 tablet 0  . Lancets (FREESTYLE) lancets CHECK BLOOD SUGAR EVERY DAY AS DIRECTED    . latanoprost (XALATAN) 0.005 % ophthalmic solution     . latanoprost (XALATAN) 0.005 % ophthalmic solution Place 0.005 drops into both eyes daily.    Marland Kitchen lisinopril (PRINIVIL,ZESTRIL) 20 MG tablet Take 30 mg by mouth daily.    . metFORMIN (GLUCOPHAGE-XR) 500 MG 24 hr tablet Take 1,000 mg by mouth 2 (two) times daily.    . ondansetron (ZOFRAN) 4 MG tablet Take 1 tablet (4 mg total) by mouth every 6 (six) hours as needed for nausea. 20 tablet 0  .  oxyCODONE (OXYCONTIN) 10 mg 12 hr tablet Take 1 tablet (10 mg total) by mouth PRO. 1 tab PO every 12 hours for 3 days, then 1 tab PO daily for 4 days 10 tablet 0  . pantoprazole (PROTONIX) 40 MG tablet Take 40 mg by mouth daily.    . promethazine (PHENERGAN) 25 MG tablet Take 25 mg by mouth every 6 (six) hours as needed for nausea or vomiting.    Marland Kitchen rOPINIRole (REQUIP XL) 8 MG 24 hr tablet Take 8 mg by mouth at bedtime.    . rosuvastatin (CRESTOR) 10 MG tablet Take 10 mg by mouth.    . senna (SENOKOT)  8.6 MG TABS tablet Take 2 tablets (17.2 mg total) by mouth at bedtime. 60 each 3  . terbinafine (LAMISIL) 250 MG tablet      Current Facility-Administered Medications on File Prior to Visit  Medication Dose Route Frequency Provider Last Rate Last Dose  . triamcinolone acetonide (KENALOG) 10 MG/ML injection 10 mg  10 mg Other Once [place], Diana Davenport, DPM      . triamcinolone acetonide (KENALOG-40) injection 20 mg  20 mg Other Once Landis Martins, DPM        Allergies  Allergen Reactions  . Aleve [Naproxen Sodium] Hives  . Aspirin Other (See Comments)    Unsure Unknown "childhood allergy"  . Penicillins Other (See Comments)    Unsure All pt remembers is nose bleeds associated with this reaction.  Has patient had a PCN reaction causing immediate rash, facial/tongue/throat swelling, SOB or lightheadedness with hypotension: Unknown Has patient had a PCN reaction causing severe rash involving mucus membranes or skin necrosis: Unknown Has patient had a PCN reaction that required hospitalization: Yes Has patient had a PCN reaction occurring within the last 10 years: No If all of the above answers are "NO", then may proceed with Cepha  . Latex Rash    Objective: There were no vitals filed for this visit.  General: No acute distress, AAOx3  Left foot: Sutures intact with no gapping or dehiscence at surgical site, mild swelling to left foot, no erythema, no warmth, no drainage, no signs of infection noted, Capillary fill time <3 seconds in all digits, gross sensation present via light touch to left foot. No pain or crepitation with range of motion left foot. Unchanged cavus and fixed varus ankle deformity on left.  No pain with calf compression.   Assessment and Plan:  Problem List Items Addressed This Visit    None    Visit Diagnoses    S/P foot surgery, left    -  Primary   Prominent metatarsal head of left foot       Capsulitis       Left foot pain           -Patient seen and  evaluated -Sutures removed -Applied dry sterile dressing to surgical site left foot secured with ACE wrap and stockinet  -Advised patient to make sure to keep dressings clean, dry, and intact to left surgical site for today. May take off the dressing on tomorrow and shower. Advised patient to allow Steri-Strips to fall off on their own -Advised patient to continue with post-op shoe on left , 1 week and then slowly depending on how foot feels in his swelling is down, May try a wide tennis shoe. -Advised patient to limit activity to tolerance  -Advised patient to ice and elevate as necessary  -Will plan for x-ray and incision check at next office visit. In  the meantime, patient to call office if any issues or problems arise.   Landis Martins, DPM

## 2017-08-22 ENCOUNTER — Ambulatory Visit (INDEPENDENT_AMBULATORY_CARE_PROVIDER_SITE_OTHER): Payer: PPO

## 2017-08-22 ENCOUNTER — Ambulatory Visit (INDEPENDENT_AMBULATORY_CARE_PROVIDER_SITE_OTHER): Payer: PPO | Admitting: Sports Medicine

## 2017-08-22 DIAGNOSIS — M21962 Unspecified acquired deformity of left lower leg: Secondary | ICD-10-CM | POA: Diagnosis not present

## 2017-08-22 DIAGNOSIS — M21622 Bunionette of left foot: Secondary | ICD-10-CM | POA: Diagnosis not present

## 2017-08-22 DIAGNOSIS — M216X2 Other acquired deformities of left foot: Secondary | ICD-10-CM

## 2017-08-22 DIAGNOSIS — M216X9 Other acquired deformities of unspecified foot: Secondary | ICD-10-CM

## 2017-08-22 DIAGNOSIS — Z9889 Other specified postprocedural states: Secondary | ICD-10-CM

## 2017-08-22 DIAGNOSIS — M779 Enthesopathy, unspecified: Secondary | ICD-10-CM

## 2017-08-22 DIAGNOSIS — M79672 Pain in left foot: Secondary | ICD-10-CM

## 2017-08-22 NOTE — Progress Notes (Signed)
Subjective: Samuel Hodge is a 66 y.o. male patient seen today in office for POV #3  (DOS 07-25-17), S/P Left 5th metatarsal head resection. Patient denies pain at surgical site, admits worsening ankle pain now that he is back in his tennis shoes, denies calf pain, denies headache, chest pain, shortness of breath, nausea, vomiting, fever, or chills. No other issues noted.   Patient Active Problem List   Diagnosis Date Noted  . Vitamin B12 deficiency 12/13/2016  . Chronic gout of multiple sites 04/04/2016  . Chronic gouty arthritis 04/04/2016  . Gastroesophageal reflux disease without esophagitis 04/04/2016  . Mixed hyperlipidemia 04/04/2016  . Multiple-type hyperlipidemia 04/04/2016  . Benign neoplasm of colon 04/03/2016  . Cardiac arrhythmia 04/03/2016  . Cardiac enlargement 04/03/2016  . Cardiomegaly 04/03/2016  . Diabetes, polyneuropathy ([place]) 04/03/2016  . Diabetic polyneuropathy ([place]) 04/03/2016  . Essential hypertension 04/03/2016  . Generalized OA 04/03/2016  . Hearing loss 04/03/2016  . High risk medication use 04/03/2016  . Hydrocele of testis 04/03/2016  . Hypogonadism in male 04/03/2016  . Obesity (BMI 30-39.9) 04/03/2016  . Peripheral neuropathy 04/03/2016  . Primary osteoarthritis involving multiple joints 04/03/2016  . RLS (restless legs syndrome) 04/03/2016  . Thoracic radiculopathy 04/03/2016  . Thoracic root lesion 04/03/2016  . Primary osteoarthritis of right knee 10/10/2015  . Restless legs syndrome 12/14/2013  . Dyspnea and respiratory abnormality 02/16/2013    Current Outpatient Prescriptions on File Prior to Visit  Medication Sig Dispense Refill  . amLODipine (NORVASC) 5 MG tablet Take 5 mg by mouth daily.    Marland Kitchen apixaban (ELIQUIS) 2.5 MG TABS tablet Take 1 tablet (2.5 mg total) by mouth every 12 (twelve) hours. 60 tablet 0  . azithromycin (ZITHROMAX) 250 MG tablet     . CLOBETASOL PROPIONATE E 0.05 % emollient cream     . cyclobenzaprine (FLEXERIL) 10  MG tablet Take 10 mg by mouth.    . docusate sodium (COLACE) 100 MG capsule Take 1 capsule (100 mg total) by mouth 2 (two) times daily. (Patient taking differently: Take 100 mg by mouth 2 (two) times daily as needed for mild constipation. ) 60 capsule 3  . gabapentin (NEURONTIN) 600 MG tablet Take 1,200 mg by mouth 2 (two) times daily.     . Gabapentin Enacarbil 600 MG TBCR Take 1 pill with evening meal    . glimepiride (AMARYL) 1 MG tablet Take 1 mg by mouth daily with breakfast.    . glucose blood (FREESTYLE LITE) test strip USE 1 STRIP EVERY DAY    . hydrochlorothiazide (HYDRODIURIL) 25 MG tablet Take 25 mg by mouth daily.    Marland Kitchen HYDROcodone-acetaminophen (NORCO) 10-325 MG tablet Take 1 tablet by mouth every 6 (six) hours as needed.    Marland Kitchen HYDROcodone-acetaminophen (NORCO) 5-325 MG tablet Take 1-2 tablets by mouth every 4 (four) hours as needed for moderate pain. 90 tablet 0  . Lancets (FREESTYLE) lancets CHECK BLOOD SUGAR EVERY DAY AS DIRECTED    . latanoprost (XALATAN) 0.005 % ophthalmic solution     . latanoprost (XALATAN) 0.005 % ophthalmic solution Place 0.005 drops into both eyes daily.    Marland Kitchen lisinopril (PRINIVIL,ZESTRIL) 20 MG tablet Take 30 mg by mouth daily.    . metFORMIN (GLUCOPHAGE-XR) 500 MG 24 hr tablet Take 1,000 mg by mouth 2 (two) times daily.    . ondansetron (ZOFRAN) 4 MG tablet Take 1 tablet (4 mg total) by mouth every 6 (six) hours as needed for nausea. 20 tablet 0  .  oxyCODONE (OXYCONTIN) 10 mg 12 hr tablet Take 1 tablet (10 mg total) by mouth PRO. 1 tab PO every 12 hours for 3 days, then 1 tab PO daily for 4 days 10 tablet 0  . pantoprazole (PROTONIX) 40 MG tablet Take 40 mg by mouth daily.    . promethazine (PHENERGAN) 25 MG tablet Take 25 mg by mouth every 6 (six) hours as needed for nausea or vomiting.    Marland Kitchen rOPINIRole (REQUIP XL) 8 MG 24 hr tablet Take 8 mg by mouth at bedtime.    . rosuvastatin (CRESTOR) 10 MG tablet Take 10 mg by mouth.    . senna (SENOKOT) 8.6 MG TABS  tablet Take 2 tablets (17.2 mg total) by mouth at bedtime. 60 each 3  . terbinafine (LAMISIL) 250 MG tablet      Current Facility-Administered Medications on File Prior to Visit  Medication Dose Route Frequency Provider Last Rate Last Dose  . triamcinolone acetonide (KENALOG) 10 MG/ML injection 10 mg  10 mg Other Once Winton, Iliany Losier, DPM      . triamcinolone acetonide (KENALOG-40) injection 20 mg  20 mg Other Once Landis Martins, DPM        Allergies  Allergen Reactions  . Aleve [Naproxen Sodium] Hives  . Aspirin Other (See Comments)    Unsure Unknown "childhood allergy"  . Penicillins Other (See Comments)    Unsure All pt remembers is nose bleeds associated with this reaction.  Has patient had a PCN reaction causing immediate rash, facial/tongue/throat swelling, SOB or lightheadedness with hypotension: Unknown Has patient had a PCN reaction causing severe rash involving mucus membranes or skin necrosis: Unknown Has patient had a PCN reaction that required hospitalization: Yes Has patient had a PCN reaction occurring within the last 10 years: No If all of the above answers are "NO", then may proceed with Cepha  . Latex Rash    Objective: There were no vitals filed for this visit.  General: No acute distress, AAOx3  Left foot: Incision healing well at surgical site, mild swelling to left foot, no erythema, no warmth, no drainage, no signs of infection noted, Capillary fill time <3 seconds in all digits, gross sensation present via light touch to left foot. No pain or crepitation with range of motion left foot. Unchanged cavus and fixed varus ankle deformity on left, diffuse ankle pain.  No pain with calf compression.   Assessment and Plan:  Problem List Items Addressed This Visit    None    Visit Diagnoses    Tailor's bunion of left foot    -  Primary   Relevant Orders   DG Foot Complete Left (Completed)   S/P foot surgery, left       Prominent metatarsal head of left foot        Capsulitis       Left foot pain       Cavus deformity of foot, acquired       Ankle deformity, left           -Patient seen and evaluated -Dispensed trilock ankle support/brace -Continue with good supportive tennis shoe. -Advised patient to limit activity to tolerance  -Advised patient to ice and elevate as necessary  -Will plan for incision check and further discussion of ankle pain if no better with brace at next office visit. In the meantime, patient to call office if any issues or problems arise.   Landis Martins, DPM

## 2017-09-03 DIAGNOSIS — G4733 Obstructive sleep apnea (adult) (pediatric): Secondary | ICD-10-CM | POA: Diagnosis not present

## 2017-09-19 ENCOUNTER — Ambulatory Visit (INDEPENDENT_AMBULATORY_CARE_PROVIDER_SITE_OTHER): Payer: PPO | Admitting: Sports Medicine

## 2017-09-19 ENCOUNTER — Encounter: Payer: Self-pay | Admitting: Sports Medicine

## 2017-09-19 DIAGNOSIS — M25572 Pain in left ankle and joints of left foot: Secondary | ICD-10-CM

## 2017-09-19 DIAGNOSIS — M21962 Unspecified acquired deformity of left lower leg: Secondary | ICD-10-CM

## 2017-09-19 DIAGNOSIS — Z9889 Other specified postprocedural states: Secondary | ICD-10-CM | POA: Diagnosis not present

## 2017-09-19 DIAGNOSIS — G8929 Other chronic pain: Secondary | ICD-10-CM

## 2017-09-19 DIAGNOSIS — M19072 Primary osteoarthritis, left ankle and foot: Secondary | ICD-10-CM

## 2017-09-19 DIAGNOSIS — M779 Enthesopathy, unspecified: Secondary | ICD-10-CM

## 2017-09-19 NOTE — Progress Notes (Signed)
Subjective: Samuel Hodge is a 66 y.o. male patient seen today in office for POV #4  (DOS 07-25-17), S/P Left 5th metatarsal head resection. Patient denies pain at surgical site, admits continued ankle pain, denies calf pain, denies headache, chest pain, shortness of breath, nausea, vomiting, fever, or chills. No other issues noted.   Patient Active Problem List   Diagnosis Date Noted  . Vitamin B12 deficiency 12/13/2016  . Chronic gout of multiple sites 04/04/2016  . Chronic gouty arthritis 04/04/2016  . Gastroesophageal reflux disease without esophagitis 04/04/2016  . Mixed hyperlipidemia 04/04/2016  . Multiple-type hyperlipidemia 04/04/2016  . Benign neoplasm of colon 04/03/2016  . Cardiac arrhythmia 04/03/2016  . Cardiac enlargement 04/03/2016  . Cardiomegaly 04/03/2016  . Diabetes, polyneuropathy ([place]) 04/03/2016  . Diabetic polyneuropathy ([place]) 04/03/2016  . Essential hypertension 04/03/2016  . Generalized OA 04/03/2016  . Hearing loss 04/03/2016  . High risk medication use 04/03/2016  . Hydrocele of testis 04/03/2016  . Hypogonadism in male 04/03/2016  . Obesity (BMI 30-39.9) 04/03/2016  . Peripheral neuropathy 04/03/2016  . Primary osteoarthritis involving multiple joints 04/03/2016  . RLS (restless legs syndrome) 04/03/2016  . Thoracic radiculopathy 04/03/2016  . Thoracic root lesion 04/03/2016  . Primary osteoarthritis of right knee 10/10/2015  . Restless legs syndrome 12/14/2013  . Dyspnea and respiratory abnormality 02/16/2013    Current Outpatient Medications on File Prior to Visit  Medication Sig Dispense Refill  . amLODipine (NORVASC) 5 MG tablet Take 5 mg by mouth daily.    Marland Kitchen apixaban (ELIQUIS) 2.5 MG TABS tablet Take 1 tablet (2.5 mg total) by mouth every 12 (twelve) hours. 60 tablet 0  . azithromycin (ZITHROMAX) 250 MG tablet     . CLOBETASOL PROPIONATE E 0.05 % emollient cream     . cyclobenzaprine (FLEXERIL) 10 MG tablet Take 10 mg by mouth.    .  docusate sodium (COLACE) 100 MG capsule Take 1 capsule (100 mg total) by mouth 2 (two) times daily. (Patient taking differently: Take 100 mg by mouth 2 (two) times daily as needed for mild constipation. ) 60 capsule 3  . gabapentin (NEURONTIN) 600 MG tablet Take 1,200 mg by mouth 2 (two) times daily.     . Gabapentin Enacarbil 600 MG TBCR Take 1 pill with evening meal    . glimepiride (AMARYL) 1 MG tablet Take 1 mg by mouth daily with breakfast.    . glucose blood (FREESTYLE LITE) test strip USE 1 STRIP EVERY DAY    . hydrochlorothiazide (HYDRODIURIL) 25 MG tablet Take 25 mg by mouth daily.    Marland Kitchen HYDROcodone-acetaminophen (NORCO) 10-325 MG tablet Take 1 tablet by mouth every 6 (six) hours as needed.    Marland Kitchen HYDROcodone-acetaminophen (NORCO) 5-325 MG tablet Take 1-2 tablets by mouth every 4 (four) hours as needed for moderate pain. 90 tablet 0  . Lancets (FREESTYLE) lancets CHECK BLOOD SUGAR EVERY DAY AS DIRECTED    . latanoprost (XALATAN) 0.005 % ophthalmic solution     . latanoprost (XALATAN) 0.005 % ophthalmic solution Place 0.005 drops into both eyes daily.    Marland Kitchen lisinopril (PRINIVIL,ZESTRIL) 20 MG tablet Take 30 mg by mouth daily.    . metFORMIN (GLUCOPHAGE-XR) 500 MG 24 hr tablet Take 1,000 mg by mouth 2 (two) times daily.    . ondansetron (ZOFRAN) 4 MG tablet Take 1 tablet (4 mg total) by mouth every 6 (six) hours as needed for nausea. 20 tablet 0  . oxyCODONE (OXYCONTIN) 10 mg 12 hr tablet  Take 1 tablet (10 mg total) by mouth PRO. 1 tab PO every 12 hours for 3 days, then 1 tab PO daily for 4 days 10 tablet 0  . pantoprazole (PROTONIX) 40 MG tablet Take 40 mg by mouth daily.    . promethazine (PHENERGAN) 25 MG tablet Take 25 mg by mouth every 6 (six) hours as needed for nausea or vomiting.    Marland Kitchen rOPINIRole (REQUIP XL) 8 MG 24 hr tablet Take 8 mg by mouth at bedtime.    . rosuvastatin (CRESTOR) 10 MG tablet Take 10 mg by mouth.    . senna (SENOKOT) 8.6 MG TABS tablet Take 2 tablets (17.2 mg total)  by mouth at bedtime. 60 each 3  . terbinafine (LAMISIL) 250 MG tablet      Current Facility-Administered Medications on File Prior to Visit  Medication Dose Route Frequency Provider Last Rate Last Dose  . triamcinolone acetonide (KENALOG) 10 MG/ML injection 10 mg  10 mg Other Once Putnam, Ardie Mclennan, DPM      . triamcinolone acetonide (KENALOG-40) injection 20 mg  20 mg Other Once Landis Martins, DPM        Allergies  Allergen Reactions  . Aleve [Naproxen Sodium] Hives  . Aspirin Other (See Comments)    Unsure Unknown "childhood allergy"  . Penicillins Other (See Comments)    Unsure All pt remembers is nose bleeds associated with this reaction.  Has patient had a PCN reaction causing immediate rash, facial/tongue/throat swelling, SOB or lightheadedness with hypotension: Unknown Has patient had a PCN reaction causing severe rash involving mucus membranes or skin necrosis: Unknown Has patient had a PCN reaction that required hospitalization: Yes Has patient had a PCN reaction occurring within the last 10 years: No If all of the above answers are "NO", then may proceed with Cepha  . Latex Rash    Objective: There were no vitals filed for this visit.  General: No acute distress, AAOx3  Left foot: Incision healing well at surgical site, mild swelling to left foot, no erythema, no warmth, no drainage, no signs of infection noted, Capillary fill time <3 seconds in all digits, gross sensation present via light touch to left foot. No pain or crepitation with range of motion left foot. Unchanged cavus and fixed varus ankle deformity on left, lateral>anterior>medial gutter ankle pain on left.  No pain with calf compression.   Assessment and Plan:  Problem List Items Addressed This Visit    None    Visit Diagnoses    S/P foot surgery, left    -  Primary   Capsulitis       Chronic pain of left ankle       Ankle deformity, left       Osteoarthritis of left ankle and foot            -Patient seen and evaluated -Surgical site healing well -Discussed treatment options for Left ankle pain and arthritis  -After oral consent and aseptic prep, injected a mixture containing 1 ml of 2%  plain lidocaine, 1 ml 0.5% plain marcaine, 0.5 ml of kenalog 10 and 0.5 ml of dexamethasone phosphate into left lateral ankle without complication. Post-injection care discussed with patient.  -Changed out trilock ankle support/brace for donjoy  -Continue with good supportive tennis shoe. -Advised patient to limit activity to tolerance  -Advised patient to ice and elevate as necessary  -Will plan for further discussion of ankle pain if no better with brace and injection at next office visit. In  the meantime, patient to call office if any issues or problems arise. Advised patient that he will need CT scan and likely referral to ortho for TAR.   Landis Martins, DPM

## 2017-09-23 DIAGNOSIS — H401131 Primary open-angle glaucoma, bilateral, mild stage: Secondary | ICD-10-CM | POA: Diagnosis not present

## 2017-09-23 DIAGNOSIS — H47233 Glaucomatous optic atrophy, bilateral: Secondary | ICD-10-CM | POA: Diagnosis not present

## 2017-09-23 DIAGNOSIS — I10 Essential (primary) hypertension: Secondary | ICD-10-CM | POA: Diagnosis not present

## 2017-10-30 ENCOUNTER — Encounter: Payer: Self-pay | Admitting: Sports Medicine

## 2017-10-30 ENCOUNTER — Ambulatory Visit (INDEPENDENT_AMBULATORY_CARE_PROVIDER_SITE_OTHER): Payer: PPO | Admitting: Sports Medicine

## 2017-10-30 DIAGNOSIS — M25572 Pain in left ankle and joints of left foot: Secondary | ICD-10-CM

## 2017-10-30 DIAGNOSIS — Z9889 Other specified postprocedural states: Secondary | ICD-10-CM

## 2017-10-30 DIAGNOSIS — M21962 Unspecified acquired deformity of left lower leg: Secondary | ICD-10-CM

## 2017-10-30 DIAGNOSIS — M779 Enthesopathy, unspecified: Secondary | ICD-10-CM

## 2017-10-30 DIAGNOSIS — M19072 Primary osteoarthritis, left ankle and foot: Secondary | ICD-10-CM

## 2017-10-30 DIAGNOSIS — M79672 Pain in left foot: Secondary | ICD-10-CM

## 2017-10-30 DIAGNOSIS — G8929 Other chronic pain: Secondary | ICD-10-CM

## 2017-10-30 DIAGNOSIS — M216X9 Other acquired deformities of unspecified foot: Secondary | ICD-10-CM

## 2017-10-30 NOTE — Progress Notes (Signed)
Subjective: Samuel Hodge is a 66 y.o. male patient seen today in office for POV #5  (DOS 07-25-17), S/P Left 5th metatarsal head resection. Patient denies pain at surgical site, admits continued ankle pain that is doing better with prefabricated ankle brace and after steroid injection however still has some days where it bothers him pretty significantly states that he has used previous custom foot braces in the past that did not offer any additional benefit, denies calf pain, denies headache, chest pain, shortness of breath, nausea, vomiting, fever, or chills. No other issues noted.   Patient Active Problem List   Diagnosis Date Noted  . Vitamin B12 deficiency 12/13/2016  . Chronic gout of multiple sites 04/04/2016  . Chronic gouty arthritis 04/04/2016  . Gastroesophageal reflux disease without esophagitis 04/04/2016  . Mixed hyperlipidemia 04/04/2016  . Multiple-type hyperlipidemia 04/04/2016  . Benign neoplasm of colon 04/03/2016  . Cardiac arrhythmia 04/03/2016  . Cardiac enlargement 04/03/2016  . Cardiomegaly 04/03/2016  . Diabetes, polyneuropathy ([place]) 04/03/2016  . Diabetic polyneuropathy ([place]) 04/03/2016  . Essential hypertension 04/03/2016  . Generalized OA 04/03/2016  . Hearing loss 04/03/2016  . High risk medication use 04/03/2016  . Hydrocele of testis 04/03/2016  . Hypogonadism in male 04/03/2016  . Obesity (BMI 30-39.9) 04/03/2016  . Peripheral neuropathy 04/03/2016  . Primary osteoarthritis involving multiple joints 04/03/2016  . RLS (restless legs syndrome) 04/03/2016  . Thoracic radiculopathy 04/03/2016  . Thoracic root lesion 04/03/2016  . Primary osteoarthritis of right knee 10/10/2015  . Restless legs syndrome 12/14/2013  . Dyspnea and respiratory abnormality 02/16/2013    Current Outpatient Medications on File Prior to Visit  Medication Sig Dispense Refill  . amLODipine (NORVASC) 5 MG tablet Take 5 mg by mouth daily.    Marland Kitchen apixaban (ELIQUIS) 2.5 MG TABS  tablet Take 1 tablet (2.5 mg total) by mouth every 12 (twelve) hours. 60 tablet 0  . azithromycin (ZITHROMAX) 250 MG tablet     . CLOBETASOL PROPIONATE E 0.05 % emollient cream     . cyclobenzaprine (FLEXERIL) 10 MG tablet Take 10 mg by mouth.    . docusate sodium (COLACE) 100 MG capsule Take 1 capsule (100 mg total) by mouth 2 (two) times daily. (Patient taking differently: Take 100 mg by mouth 2 (two) times daily as needed for mild constipation. ) 60 capsule 3  . gabapentin (NEURONTIN) 600 MG tablet Take 1,200 mg by mouth 2 (two) times daily.     . Gabapentin Enacarbil 600 MG TBCR Take 1 pill with evening meal    . glimepiride (AMARYL) 1 MG tablet Take 1 mg by mouth daily with breakfast.    . glucose blood (FREESTYLE LITE) test strip USE 1 STRIP EVERY DAY    . hydrochlorothiazide (HYDRODIURIL) 25 MG tablet Take 25 mg by mouth daily.    Marland Kitchen HYDROcodone-acetaminophen (NORCO) 10-325 MG tablet Take 1 tablet by mouth every 6 (six) hours as needed.    Marland Kitchen HYDROcodone-acetaminophen (NORCO) 5-325 MG tablet Take 1-2 tablets by mouth every 4 (four) hours as needed for moderate pain. 90 tablet 0  . Lancets (FREESTYLE) lancets CHECK BLOOD SUGAR EVERY DAY AS DIRECTED    . latanoprost (XALATAN) 0.005 % ophthalmic solution     . latanoprost (XALATAN) 0.005 % ophthalmic solution Place 0.005 drops into both eyes daily.    Marland Kitchen lisinopril (PRINIVIL,ZESTRIL) 20 MG tablet Take 30 mg by mouth daily.    . metFORMIN (GLUCOPHAGE-XR) 500 MG 24 hr tablet Take 1,000 mg by  mouth 2 (two) times daily.    . ondansetron (ZOFRAN) 4 MG tablet Take 1 tablet (4 mg total) by mouth every 6 (six) hours as needed for nausea. 20 tablet 0  . oxyCODONE (OXYCONTIN) 10 mg 12 hr tablet Take 1 tablet (10 mg total) by mouth PRO. 1 tab PO every 12 hours for 3 days, then 1 tab PO daily for 4 days 10 tablet 0  . pantoprazole (PROTONIX) 40 MG tablet Take 40 mg by mouth daily.    . promethazine (PHENERGAN) 25 MG tablet Take 25 mg by mouth every 6 (six)  hours as needed for nausea or vomiting.    Marland Kitchen rOPINIRole (REQUIP XL) 8 MG 24 hr tablet Take 8 mg by mouth at bedtime.    . rosuvastatin (CRESTOR) 10 MG tablet Take 10 mg by mouth.    . senna (SENOKOT) 8.6 MG TABS tablet Take 2 tablets (17.2 mg total) by mouth at bedtime. 60 each 3  . terbinafine (LAMISIL) 250 MG tablet      Current Facility-Administered Medications on File Prior to Visit  Medication Dose Route Frequency Provider Last Rate Last Dose  . triamcinolone acetonide (KENALOG) 10 MG/ML injection 10 mg  10 mg Other Once Polo, Sonia Stickels, DPM      . triamcinolone acetonide (KENALOG-40) injection 20 mg  20 mg Other Once Landis Martins, DPM        Allergies  Allergen Reactions  . Aleve [Naproxen Sodium] Hives  . Aspirin Other (See Comments)    Unsure Unknown "childhood allergy"  . Penicillins Other (See Comments)    Unsure All pt remembers is nose bleeds associated with this reaction.  Has patient had a PCN reaction causing immediate rash, facial/tongue/throat swelling, SOB or lightheadedness with hypotension: Unknown Has patient had a PCN reaction causing severe rash involving mucus membranes or skin necrosis: Unknown Has patient had a PCN reaction that required hospitalization: Yes Has patient had a PCN reaction occurring within the last 10 years: No If all of the above answers are "NO", then may proceed with Cepha  . Latex Rash    Objective: There were no vitals filed for this visit.  General: No acute distress, AAOx3  Left foot: Incision is well-healed at fifth metatarsal phalangeal joint, mild swelling to left foot, no erythema, no warmth, no drainage, no signs of infection noted, Capillary fill time <3 seconds in all digits, gross sensation present via light touch to left foot. No pain or crepitation with range of motion left foot. Unchanged cavus and fixed varus ankle deformity on left, lateral>anterior>medial gutter ankle pain on left.  No pain with calf compression.    Assessment and Plan:  Problem List Items Addressed This Visit    None    Visit Diagnoses    S/P foot surgery, left    -  Primary   Capsulitis       Chronic pain of left ankle       Ankle deformity, left       Osteoarthritis of left ankle and foot       Cavus deformity of foot, acquired       Left foot pain           -Patient seen and evaluated -Surgical site healing well; this area is discharged from postoperative care -Discussed treatment options and continued care for Left ankle pain and arthritis  -Advised patient to use his meloxicam that he already has -Continue with ankle support/brace, Donjoy  -Continue with good supportive tennis shoe.  Advised patient to replace shoes over the next 6-8 weeks. -Advised patient to limit activity to tolerance  -Advised patient to ice and elevate as necessary -Patient to return to office for follow-up appointment with [facility] for consultation on tomorrow for possible bracing options I advised patient to bring previous custom braces that he has had made with him so that way we can see if we can make something that could possibly work a little better for him compared to the prefabricated brace -Will plan for further discussion of ankle pain if no better with brace options and injection and conservative care. In the meantime, patient to call office if any issues or problems arise. Advised patient that he will need CT scan and likely referral to ortho for TAR.   Landis Martins, DPM

## 2017-10-31 ENCOUNTER — Other Ambulatory Visit: Payer: PPO | Admitting: *Deleted

## 2017-12-17 DIAGNOSIS — G4733 Obstructive sleep apnea (adult) (pediatric): Secondary | ICD-10-CM | POA: Diagnosis not present

## 2017-12-19 ENCOUNTER — Ambulatory Visit (INDEPENDENT_AMBULATORY_CARE_PROVIDER_SITE_OTHER): Payer: PPO | Admitting: Sports Medicine

## 2017-12-19 DIAGNOSIS — G8929 Other chronic pain: Secondary | ICD-10-CM | POA: Diagnosis not present

## 2017-12-19 DIAGNOSIS — M19072 Primary osteoarthritis, left ankle and foot: Secondary | ICD-10-CM

## 2017-12-19 DIAGNOSIS — M25572 Pain in left ankle and joints of left foot: Secondary | ICD-10-CM

## 2017-12-19 DIAGNOSIS — M216X9 Other acquired deformities of unspecified foot: Secondary | ICD-10-CM | POA: Diagnosis not present

## 2017-12-19 DIAGNOSIS — M21962 Unspecified acquired deformity of left lower leg: Secondary | ICD-10-CM

## 2017-12-20 DIAGNOSIS — I10 Essential (primary) hypertension: Secondary | ICD-10-CM | POA: Diagnosis not present

## 2017-12-20 DIAGNOSIS — Z79899 Other long term (current) drug therapy: Secondary | ICD-10-CM | POA: Diagnosis not present

## 2017-12-20 DIAGNOSIS — E1142 Type 2 diabetes mellitus with diabetic polyneuropathy: Secondary | ICD-10-CM | POA: Diagnosis not present

## 2017-12-20 DIAGNOSIS — E782 Mixed hyperlipidemia: Secondary | ICD-10-CM | POA: Diagnosis not present

## 2017-12-25 ENCOUNTER — Ambulatory Visit (INDEPENDENT_AMBULATORY_CARE_PROVIDER_SITE_OTHER): Payer: PPO | Admitting: Sports Medicine

## 2017-12-25 ENCOUNTER — Encounter: Payer: Self-pay | Admitting: Sports Medicine

## 2017-12-25 DIAGNOSIS — L97511 Non-pressure chronic ulcer of other part of right foot limited to breakdown of skin: Secondary | ICD-10-CM

## 2017-12-25 DIAGNOSIS — E1142 Type 2 diabetes mellitus with diabetic polyneuropathy: Secondary | ICD-10-CM | POA: Diagnosis not present

## 2017-12-25 DIAGNOSIS — M216X9 Other acquired deformities of unspecified foot: Secondary | ICD-10-CM

## 2017-12-25 MED ORDER — DOXYCYCLINE HYCLATE 100 MG PO TABS: 100.0000 mg | ORAL_TABLET | Freq: Two times a day (BID) | ORAL | 0 refills | Status: DC

## 2017-12-25 NOTE — Progress Notes (Signed)
Subjective: Samuel Hodge is a 67 y.o. male patient seen in office for evaluation of ulceration of the right foot. Patient has a history of diabetes and a blood glucose level not recorded today.  Patient is changing the dressing using antibiotic cream at home. Reports that when he came last week for brace on left had a red spot on right that he showed the nurse but since then area started to drain a little and area was red. Denies nausea/fever/vomiting/chills/night sweats/shortness of breath/pain. Patient has no other pedal complaints at this time.  Patient Active Problem List   Diagnosis Date Noted  . Vitamin B12 deficiency 12/13/2016  . Chronic gout of multiple sites 04/04/2016  . Chronic gouty arthritis 04/04/2016  . Gastroesophageal reflux disease without esophagitis 04/04/2016  . Mixed hyperlipidemia 04/04/2016  . Multiple-type hyperlipidemia 04/04/2016  . Benign neoplasm of colon 04/03/2016  . Cardiac arrhythmia 04/03/2016  . Cardiac enlargement 04/03/2016  . Cardiomegaly 04/03/2016  . Diabetes, polyneuropathy ([place]) 04/03/2016  . Diabetic polyneuropathy ([place]) 04/03/2016  . Essential hypertension 04/03/2016  . Generalized OA 04/03/2016  . Hearing loss 04/03/2016  . High risk medication use 04/03/2016  . Hydrocele of testis 04/03/2016  . Hypogonadism in male 04/03/2016  . Obesity (BMI 30-39.9) 04/03/2016  . Peripheral neuropathy 04/03/2016  . Primary osteoarthritis involving multiple joints 04/03/2016  . RLS (restless legs syndrome) 04/03/2016  . Thoracic radiculopathy 04/03/2016  . Thoracic root lesion 04/03/2016  . Primary osteoarthritis of right knee 10/10/2015  . Restless legs syndrome 12/14/2013  . Dyspnea and respiratory abnormality 02/16/2013   Current Outpatient Medications on File Prior to Visit  Medication Sig Dispense Refill  . amLODipine (NORVASC) 5 MG tablet Take 5 mg by mouth daily.    Marland Kitchen apixaban (ELIQUIS) 2.5 MG TABS tablet Take 1 tablet (2.5 mg total) by  mouth every 12 (twelve) hours. 60 tablet 0  . azithromycin (ZITHROMAX) 250 MG tablet     . CLOBETASOL PROPIONATE E 0.05 % emollient cream     . cyclobenzaprine (FLEXERIL) 10 MG tablet Take 10 mg by mouth.    . docusate sodium (COLACE) 100 MG capsule Take 1 capsule (100 mg total) by mouth 2 (two) times daily. (Patient taking differently: Take 100 mg by mouth 2 (two) times daily as needed for mild constipation. ) 60 capsule 3  . gabapentin (NEURONTIN) 600 MG tablet Take 1,200 mg by mouth 2 (two) times daily.     . Gabapentin Enacarbil 600 MG TBCR Take 1 pill with evening meal    . glimepiride (AMARYL) 1 MG tablet Take 1 mg by mouth daily with breakfast.    . glucose blood (FREESTYLE LITE) test strip USE 1 STRIP EVERY DAY    . hydrochlorothiazide (HYDRODIURIL) 25 MG tablet Take 25 mg by mouth daily.    Marland Kitchen HYDROcodone-acetaminophen (NORCO) 10-325 MG tablet Take 1 tablet by mouth every 6 (six) hours as needed.    Marland Kitchen HYDROcodone-acetaminophen (NORCO) 5-325 MG tablet Take 1-2 tablets by mouth every 4 (four) hours as needed for moderate pain. 90 tablet 0  . Lancets (FREESTYLE) lancets CHECK BLOOD SUGAR EVERY DAY AS DIRECTED    . latanoprost (XALATAN) 0.005 % ophthalmic solution     . latanoprost (XALATAN) 0.005 % ophthalmic solution Place 0.005 drops into both eyes daily.    Marland Kitchen lisinopril (PRINIVIL,ZESTRIL) 20 MG tablet Take 30 mg by mouth daily.    . metFORMIN (GLUCOPHAGE-XR) 500 MG 24 hr tablet Take 1,000 mg by mouth 2 (two) times daily.    Marland Kitchen  ondansetron (ZOFRAN) 4 MG tablet Take 1 tablet (4 mg total) by mouth every 6 (six) hours as needed for nausea. 20 tablet 0  . oxyCODONE (OXYCONTIN) 10 mg 12 hr tablet Take 1 tablet (10 mg total) by mouth PRO. 1 tab PO every 12 hours for 3 days, then 1 tab PO daily for 4 days 10 tablet 0  . pantoprazole (PROTONIX) 40 MG tablet Take 40 mg by mouth daily.    . promethazine (PHENERGAN) 25 MG tablet Take 25 mg by mouth every 6 (six) hours as needed for nausea or vomiting.     Marland Kitchen rOPINIRole (REQUIP XL) 8 MG 24 hr tablet Take 8 mg by mouth at bedtime.    . rosuvastatin (CRESTOR) 10 MG tablet Take 10 mg by mouth.    . senna (SENOKOT) 8.6 MG TABS tablet Take 2 tablets (17.2 mg total) by mouth at bedtime. 60 each 3  . terbinafine (LAMISIL) 250 MG tablet      Current Facility-Administered Medications on File Prior to Visit  Medication Dose Route Frequency Provider Last Rate Last Dose  . triamcinolone acetonide (KENALOG) 10 MG/ML injection 10 mg  10 mg Other Once [place], , DPM      . triamcinolone acetonide (KENALOG-40) injection 20 mg  20 mg Other Once Landis Martins, DPM       Allergies  Allergen Reactions  . Aleve [Naproxen Sodium] Hives  . Aspirin Other (See Comments)    Unsure Unknown "childhood allergy"  . Penicillins Other (See Comments)    Unsure All pt remembers is nose bleeds associated with this reaction.  Has patient had a PCN reaction causing immediate rash, facial/tongue/throat swelling, SOB or lightheadedness with hypotension: Unknown Has patient had a PCN reaction causing severe rash involving mucus membranes or skin necrosis: Unknown Has patient had a PCN reaction that required hospitalization: Yes Has patient had a PCN reaction occurring within the last 10 years: No If all of the above answers are "NO", then may proceed with Cepha  . Latex Rash    No results found for this or any previous visit (from the past 2160 hour(s)).  Objective: There were no vitals filed for this visit.  General: Patient is awake, alert, oriented x 3 and in no acute distress.  Dermatology: Skin is warm and dry bilateral with a partial thickness ulceration present  5th met base on right. Ulceration measures 0.4cm x 0.4 cm x 0.1cm. There is a  keratotic border with a granular base. The ulceration does not  probe to bone. There is no malodor, no active drainage, local erythema, mild edema. No other acute signs of infection.   Vascular: Dorsalis Pedis pulse  =2 /4 Bilateral,  Posterior Tibial pulse = 1/4 Bilateral,  Capillary Fill Time < 5 seconds  Neurologic: Protective sensation  Intact to pedal sites. Vibratory diminished bilateral.  Musculosketal: There is decreased ankle joint range of motion Bilateral with left ankle in a fixed position because of arthritis.Prominent styloid process right foot. Minimal Pain with palpation to ulcerated area. No pain with compression to calves bilateral.  No results for input(s): GRAMSTAIN, LABORGA in the last 8760 hours.  Assessment and Plan:  Problem List Items Addressed This Visit      Endocrine   Diabetic polyneuropathy ([place])    Other Visit Diagnoses    Right foot ulcer, limited to breakdown of skin ([place])    -  Primary   Relevant Medications   doxycycline (VIBRA-TABS) 100 MG tablet   Cavus deformity of  foot, acquired           -Examined patient and discussed the progression of the wound and treatment alternatives. - Excisionally dedbrided ulceration at Right 5th met base ulcer to healthy bleeding borders removing nonviable tissue using a tissue currette. Wound measures post debridement as above. Wound was debrided to the level of the dermis with viable wound base exposed to promote healing. Hemostasis was achieved with manuel pressure. Patient tolerated procedure well without any discomfort or anesthesia necessary for this wound debridement.  -Applied antibiotic cream and dry sterile dressing and instructed patient to continue with daily dressings at home consisting of same may get cream with lidocaine for the pain -Rx Doxycycline for preventative measures -Advised patient to have his shoes stretched and meanwhile to return to post op shoe that he already has from when he had left foot surgery  - Advised patient to go to the ER or return to office if the wound worsens or if constitutional symptoms are present. -Patient to return to office in 2 weeks for follow up care and evaluation or sooner if  problems arise.  Landis Martins, DPM

## 2018-01-08 ENCOUNTER — Ambulatory Visit (INDEPENDENT_AMBULATORY_CARE_PROVIDER_SITE_OTHER): Payer: PPO | Admitting: Sports Medicine

## 2018-01-08 ENCOUNTER — Encounter: Payer: Self-pay | Admitting: Sports Medicine

## 2018-01-08 DIAGNOSIS — E1142 Type 2 diabetes mellitus with diabetic polyneuropathy: Secondary | ICD-10-CM

## 2018-01-08 DIAGNOSIS — M216X9 Other acquired deformities of unspecified foot: Secondary | ICD-10-CM

## 2018-01-08 DIAGNOSIS — L97511 Non-pressure chronic ulcer of other part of right foot limited to breakdown of skin: Secondary | ICD-10-CM | POA: Diagnosis not present

## 2018-01-08 NOTE — Progress Notes (Signed)
Subjective: Samuel Hodge is a 67 y.o. male patient seen in office for follow-up evaluation of ulceration of the right foot. Patient has a history of diabetes and a blood glucose level not recorded today.  Patient is changing the dressing using antibiotic cream at home. Reports that seems like it is getting better and less sore.  Reports that he has finished his antibiotic with no issues.  Denies nausea/fever/vomiting/chills/night sweats/shortness of breath/pain. Patient has no other pedal complaints at this time.  Patient Active Problem List   Diagnosis Date Noted  . Vitamin B12 deficiency 12/13/2016  . Chronic gout of multiple sites 04/04/2016  . Chronic gouty arthritis 04/04/2016  . Gastroesophageal reflux disease without esophagitis 04/04/2016  . Mixed hyperlipidemia 04/04/2016  . Multiple-type hyperlipidemia 04/04/2016  . Benign neoplasm of colon 04/03/2016  . Cardiac arrhythmia 04/03/2016  . Cardiac enlargement 04/03/2016  . Cardiomegaly 04/03/2016  . Diabetes, polyneuropathy ([place]) 04/03/2016  . Diabetic polyneuropathy ([place]) 04/03/2016  . Essential hypertension 04/03/2016  . Generalized OA 04/03/2016  . Hearing loss 04/03/2016  . High risk medication use 04/03/2016  . Hydrocele of testis 04/03/2016  . Hypogonadism in male 04/03/2016  . Obesity (BMI 30-39.9) 04/03/2016  . Peripheral neuropathy 04/03/2016  . Primary osteoarthritis involving multiple joints 04/03/2016  . RLS (restless legs syndrome) 04/03/2016  . Thoracic radiculopathy 04/03/2016  . Thoracic root lesion 04/03/2016  . Primary osteoarthritis of right knee 10/10/2015  . Restless legs syndrome 12/14/2013  . Dyspnea and respiratory abnormality 02/16/2013   Current Outpatient Medications on File Prior to Visit  Medication Sig Dispense Refill  . amLODipine (NORVASC) 5 MG tablet Take 5 mg by mouth daily.    Marland Kitchen apixaban (ELIQUIS) 2.5 MG TABS tablet Take 1 tablet (2.5 mg total) by mouth every 12 (twelve) hours. 60  tablet 0  . azithromycin (ZITHROMAX) 250 MG tablet     . CLOBETASOL PROPIONATE E 0.05 % emollient cream     . cyclobenzaprine (FLEXERIL) 10 MG tablet Take 10 mg by mouth.    . docusate sodium (COLACE) 100 MG capsule Take 1 capsule (100 mg total) by mouth 2 (two) times daily. (Patient taking differently: Take 100 mg by mouth 2 (two) times daily as needed for mild constipation. ) 60 capsule 3  . doxycycline (VIBRA-TABS) 100 MG tablet Take 1 tablet (100 mg total) by mouth 2 (two) times daily. 20 tablet 0  . gabapentin (NEURONTIN) 600 MG tablet Take 1,200 mg by mouth 2 (two) times daily.     . Gabapentin Enacarbil 600 MG TBCR Take 1 pill with evening meal    . glimepiride (AMARYL) 1 MG tablet Take 1 mg by mouth daily with breakfast.    . glucose blood (FREESTYLE LITE) test strip USE 1 STRIP EVERY DAY    . hydrochlorothiazide (HYDRODIURIL) 25 MG tablet Take 25 mg by mouth daily.    Marland Kitchen HYDROcodone-acetaminophen (NORCO) 10-325 MG tablet Take 1 tablet by mouth every 6 (six) hours as needed.    Marland Kitchen HYDROcodone-acetaminophen (NORCO) 5-325 MG tablet Take 1-2 tablets by mouth every 4 (four) hours as needed for moderate pain. 90 tablet 0  . Lancets (FREESTYLE) lancets CHECK BLOOD SUGAR EVERY DAY AS DIRECTED    . latanoprost (XALATAN) 0.005 % ophthalmic solution     . latanoprost (XALATAN) 0.005 % ophthalmic solution Place 0.005 drops into both eyes daily.    Marland Kitchen lisinopril (PRINIVIL,ZESTRIL) 20 MG tablet Take 30 mg by mouth daily.    . metFORMIN (GLUCOPHAGE-XR) 500 MG 24 hr  tablet Take 1,000 mg by mouth 2 (two) times daily.    . ondansetron (ZOFRAN) 4 MG tablet Take 1 tablet (4 mg total) by mouth every 6 (six) hours as needed for nausea. 20 tablet 0  . oxyCODONE (OXYCONTIN) 10 mg 12 hr tablet Take 1 tablet (10 mg total) by mouth PRO. 1 tab PO every 12 hours for 3 days, then 1 tab PO daily for 4 days 10 tablet 0  . pantoprazole (PROTONIX) 40 MG tablet Take 40 mg by mouth daily.    . promethazine (PHENERGAN) 25 MG  tablet Take 25 mg by mouth every 6 (six) hours as needed for nausea or vomiting.    Marland Kitchen rOPINIRole (REQUIP XL) 8 MG 24 hr tablet Take 8 mg by mouth at bedtime.    . rosuvastatin (CRESTOR) 10 MG tablet Take 10 mg by mouth.    . senna (SENOKOT) 8.6 MG TABS tablet Take 2 tablets (17.2 mg total) by mouth at bedtime. 60 each 3  . terbinafine (LAMISIL) 250 MG tablet      Current Facility-Administered Medications on File Prior to Visit  Medication Dose Route Frequency Provider Last Rate Last Dose  . triamcinolone acetonide (KENALOG) 10 MG/ML injection 10 mg  10 mg Other Once [place], Yaritza Leist, DPM      . triamcinolone acetonide (KENALOG-40) injection 20 mg  20 mg Other Once Landis Martins, DPM       Allergies  Allergen Reactions  . Aleve [Naproxen Sodium] Hives  . Aspirin Other (See Comments)    Unsure Unknown "childhood allergy"  . Penicillins Other (See Comments)    Unsure All pt remembers is nose bleeds associated with this reaction.  Has patient had a PCN reaction causing immediate rash, facial/tongue/throat swelling, SOB or lightheadedness with hypotension: Unknown Has patient had a PCN reaction causing severe rash involving mucus membranes or skin necrosis: Unknown Has patient had a PCN reaction that required hospitalization: Yes Has patient had a PCN reaction occurring within the last 10 years: No If all of the above answers are "NO", then may proceed with Cepha  . Latex Rash    No results found for this or any previous visit (from the past 2160 hour(s)).  Objective: There were no vitals filed for this visit.  General: Patient is awake, alert, oriented x 3 and in no acute distress.  Dermatology: Skin is warm and dry bilateral with a partial thickness ulceration present  5th met base on right. Ulceration measures 0.2cm x 0.3 cm x 0.1cm. There is a  keratotic border with a granular base. The ulceration does not  probe to bone. There is no malodor, no active drainage, local erythema,  mild edema. No other acute signs of infection.   Vascular: Dorsalis Pedis pulse =2 /4 Bilateral,  Posterior Tibial pulse = 1/4 Bilateral,  Capillary Fill Time < 5 seconds  Neurologic: Protective sensation  Intact to pedal sites. Vibratory diminished bilateral.  Musculosketal: There is decreased ankle joint range of motion Bilateral with left ankle in a fixed position because of arthritis.Prominent styloid process right foot. Minimal Pain with palpation to ulcerated area. No pain with compression to calves bilateral.  No results for input(s): GRAMSTAIN, LABORGA in the last 8760 hours.  Assessment and Plan:  Problem List Items Addressed This Visit      Endocrine   Diabetic polyneuropathy ([place])    Other Visit Diagnoses    Right foot ulcer, limited to breakdown of skin ([place])    -  Primary  Cavus deformity of foot, acquired           -Examined patient and discussed the progression of the wound and treatment alternatives. - Excisionally dedbrided ulceration at Right 5th met base ulcer to healthy bleeding borders removing nonviable tissue using a tissue currette. Wound measures post debridement as above. Wound was debrided to the level of the dermis with viable wound base exposed to promote healing. Hemostasis was achieved with manuel pressure. Patient tolerated procedure well without any discomfort or anesthesia necessary for this wound debridement.  -Applied Prisma AG and dry sterile dressing and instructed patient to continue with daily dressings at home consisting of same  -Advised patient to avoid shoes that rub or irritate the area - Advised patient to go to the ER or return to office if the wound worsens or if constitutional symptoms are present. -Patient to return to office in 2 weeks for follow up care and evaluation or sooner if problems arise.  Landis Martins, DPM

## 2018-01-22 ENCOUNTER — Encounter: Payer: Self-pay | Admitting: Sports Medicine

## 2018-01-22 ENCOUNTER — Ambulatory Visit (INDEPENDENT_AMBULATORY_CARE_PROVIDER_SITE_OTHER): Payer: PPO | Admitting: Sports Medicine

## 2018-01-22 DIAGNOSIS — M216X9 Other acquired deformities of unspecified foot: Secondary | ICD-10-CM | POA: Diagnosis not present

## 2018-01-22 DIAGNOSIS — L97511 Non-pressure chronic ulcer of other part of right foot limited to breakdown of skin: Secondary | ICD-10-CM

## 2018-01-22 DIAGNOSIS — E1142 Type 2 diabetes mellitus with diabetic polyneuropathy: Secondary | ICD-10-CM

## 2018-01-22 NOTE — Progress Notes (Signed)
Subjective: Samuel Hodge is a 67 y.o. male patient seen in office for follow-up evaluation of ulceration of the right foot. Patient has a history of diabetes and a blood glucose level not recorded today.  Patient is changing the dressing using Prisma at home. Reports that the wound has healed up however did notice some redness and soreness and inflammation over the weekend over the area.  Patient reports that he has been doing more and has been working out at [employer]. Denies nausea/fever/vomiting/chills/night sweats/shortness of breath/pain. Patient has no other pedal complaints at this time.  Patient Active Problem List   Diagnosis Date Noted  . Vitamin B12 deficiency 12/13/2016  . Chronic gout of multiple sites 04/04/2016  . Chronic gouty arthritis 04/04/2016  . Gastroesophageal reflux disease without esophagitis 04/04/2016  . Mixed hyperlipidemia 04/04/2016  . Multiple-type hyperlipidemia 04/04/2016  . Benign neoplasm of colon 04/03/2016  . Cardiac arrhythmia 04/03/2016  . Cardiac enlargement 04/03/2016  . Cardiomegaly 04/03/2016  . Diabetes, polyneuropathy ([place]) 04/03/2016  . Diabetic polyneuropathy ([place]) 04/03/2016  . Essential hypertension 04/03/2016  . Generalized OA 04/03/2016  . Hearing loss 04/03/2016  . High risk medication use 04/03/2016  . Hydrocele of testis 04/03/2016  . Hypogonadism in male 04/03/2016  . Obesity (BMI 30-39.9) 04/03/2016  . Peripheral neuropathy 04/03/2016  . Primary osteoarthritis involving multiple joints 04/03/2016  . RLS (restless legs syndrome) 04/03/2016  . Thoracic radiculopathy 04/03/2016  . Thoracic root lesion 04/03/2016  . Primary osteoarthritis of right knee 10/10/2015  . Restless legs syndrome 12/14/2013  . Dyspnea and respiratory abnormality 02/16/2013   Current Outpatient Medications on File Prior to Visit  Medication Sig Dispense Refill  . amLODipine (NORVASC) 5 MG tablet Take 5 mg by mouth daily.    Marland Kitchen apixaban (ELIQUIS) 2.5 MG TABS  tablet Take 1 tablet (2.5 mg total) by mouth every 12 (twelve) hours. 60 tablet 0  . azithromycin (ZITHROMAX) 250 MG tablet     . CLOBETASOL PROPIONATE E 0.05 % emollient cream     . cyclobenzaprine (FLEXERIL) 10 MG tablet Take 10 mg by mouth.    . docusate sodium (COLACE) 100 MG capsule Take 1 capsule (100 mg total) by mouth 2 (two) times daily. (Patient taking differently: Take 100 mg by mouth 2 (two) times daily as needed for mild constipation. ) 60 capsule 3  . doxycycline (VIBRA-TABS) 100 MG tablet Take 1 tablet (100 mg total) by mouth 2 (two) times daily. 20 tablet 0  . gabapentin (NEURONTIN) 600 MG tablet Take 1,200 mg by mouth 2 (two) times daily.     . Gabapentin Enacarbil 600 MG TBCR Take 1 pill with evening meal    . glimepiride (AMARYL) 1 MG tablet Take 1 mg by mouth daily with breakfast.    . glucose blood (FREESTYLE LITE) test strip USE 1 STRIP EVERY DAY    . hydrochlorothiazide (HYDRODIURIL) 25 MG tablet Take 25 mg by mouth daily.    Marland Kitchen HYDROcodone-acetaminophen (NORCO) 10-325 MG tablet Take 1 tablet by mouth every 6 (six) hours as needed.    Marland Kitchen HYDROcodone-acetaminophen (NORCO) 5-325 MG tablet Take 1-2 tablets by mouth every 4 (four) hours as needed for moderate pain. 90 tablet 0  . Lancets (FREESTYLE) lancets CHECK BLOOD SUGAR EVERY DAY AS DIRECTED    . latanoprost (XALATAN) 0.005 % ophthalmic solution     . latanoprost (XALATAN) 0.005 % ophthalmic solution Place 0.005 drops into both eyes daily.    Marland Kitchen lisinopril (PRINIVIL,ZESTRIL) 20 MG tablet Take  30 mg by mouth daily.    . metFORMIN (GLUCOPHAGE-XR) 500 MG 24 hr tablet Take 1,000 mg by mouth 2 (two) times daily.    . ondansetron (ZOFRAN) 4 MG tablet Take 1 tablet (4 mg total) by mouth every 6 (six) hours as needed for nausea. 20 tablet 0  . oxyCODONE (OXYCONTIN) 10 mg 12 hr tablet Take 1 tablet (10 mg total) by mouth PRO. 1 tab PO every 12 hours for 3 days, then 1 tab PO daily for 4 days 10 tablet 0  . pantoprazole (PROTONIX) 40  MG tablet Take 40 mg by mouth daily.    . promethazine (PHENERGAN) 25 MG tablet Take 25 mg by mouth every 6 (six) hours as needed for nausea or vomiting.    Marland Kitchen rOPINIRole (REQUIP XL) 8 MG 24 hr tablet Take 8 mg by mouth at bedtime.    . rosuvastatin (CRESTOR) 10 MG tablet Take 10 mg by mouth.    . senna (SENOKOT) 8.6 MG TABS tablet Take 2 tablets (17.2 mg total) by mouth at bedtime. 60 each 3  . terbinafine (LAMISIL) 250 MG tablet      Current Facility-Administered Medications on File Prior to Visit  Medication Dose Route Frequency Provider Last Rate Last Dose  . triamcinolone acetonide (KENALOG) 10 MG/ML injection 10 mg  10 mg Other Once [place], Scarleth Brame, DPM      . triamcinolone acetonide (KENALOG-40) injection 20 mg  20 mg Other Once Landis Martins, DPM       Allergies  Allergen Reactions  . Aleve [Naproxen Sodium] Hives  . Aspirin Other (See Comments)    Unsure Unknown "childhood allergy"  . Penicillins Other (See Comments)    Unsure All pt remembers is nose bleeds associated with this reaction.  Has patient had a PCN reaction causing immediate rash, facial/tongue/throat swelling, SOB or lightheadedness with hypotension: Unknown Has patient had a PCN reaction causing severe rash involving mucus membranes or skin necrosis: Unknown Has patient had a PCN reaction that required hospitalization: Yes Has patient had a PCN reaction occurring within the last 10 years: No If all of the above answers are "NO", then may proceed with Cepha  . Latex Rash    No results found for this or any previous visit (from the past 2160 hour(s)).  Objective: There were no vitals filed for this visit.  General: Patient is awake, alert, oriented x 3 and in no acute distress.  Dermatology: Skin is warm and dry bilateral with a healed ulceration 5th met base on right.  There is no opening.  There is mild focal erythema likely from irritation of shoe. There is no malodor, no active drainage, mild edema from  rubbing. No other acute signs of infection.   Vascular: Dorsalis Pedis pulse =2 /4 Bilateral,  Posterior Tibial pulse = 1/4 Bilateral,  Capillary Fill Time < 5 seconds  Neurologic: Protective sensation  Intact to pedal sites. Vibratory diminished bilateral.  Musculosketal: There is decreased ankle joint range of motion Bilateral with left ankle in a fixed position because of arthritis.Prominent styloid process right foot.  No pain to palpation to heel ulceration site on right foot.  No pain with compression to calves bilateral.  No results for input(s): GRAMSTAIN, LABORGA in the last 8760 hours.  Assessment and Plan:  Problem List Items Addressed This Visit      Endocrine   Diabetic polyneuropathy ([place])    Other Visit Diagnoses    Right foot ulcer, limited to breakdown of skin ([place])    -  Primary   Cavus deformity of foot, acquired          -Examined patient and discussed the progression of the now healed wound and treatment alternatives. -Applied offloading padding to shoes to prevent lateral overuse rubbing and irritation at the fifth met base on right -Advised patient to avoid shoes that rub or irritate the area and advised protective Band-Aid when in shoes to prevent re-ulceration - Advised patient to go to the ER or return to office if the wound recurs/worsens or if constitutional symptoms are present. -Patient to return to office in 1 month for brace check on left with Betha or sooner if problems arise.  Landis Martins, DPM

## 2018-02-13 ENCOUNTER — Ambulatory Visit: Payer: PPO | Admitting: *Deleted

## 2018-02-13 DIAGNOSIS — E1142 Type 2 diabetes mellitus with diabetic polyneuropathy: Secondary | ICD-10-CM

## 2018-02-24 DIAGNOSIS — G2581 Restless legs syndrome: Secondary | ICD-10-CM | POA: Diagnosis not present

## 2018-02-24 DIAGNOSIS — G4733 Obstructive sleep apnea (adult) (pediatric): Secondary | ICD-10-CM | POA: Diagnosis not present

## 2018-02-27 DIAGNOSIS — D2239 Melanocytic nevi of other parts of face: Secondary | ICD-10-CM | POA: Diagnosis not present

## 2018-02-27 DIAGNOSIS — L570 Actinic keratosis: Secondary | ICD-10-CM | POA: Diagnosis not present

## 2018-03-24 DIAGNOSIS — G4733 Obstructive sleep apnea (adult) (pediatric): Secondary | ICD-10-CM | POA: Diagnosis not present

## 2018-04-10 DIAGNOSIS — E119 Type 2 diabetes mellitus without complications: Secondary | ICD-10-CM | POA: Diagnosis not present

## 2018-04-10 NOTE — Progress Notes (Signed)
Patient ID: Samuel Hodge, male   DOB: [birth year], 67 y.o.   MRN: [id number]  Patient presents for fitting of [place] with [facility] Certified Pedorthist. Written and verbal break in instructions given. Patient will follow up in 6 weeks with Dr. Cannon Kettle.

## 2018-04-11 ENCOUNTER — Encounter: Payer: Self-pay | Admitting: Sports Medicine

## 2018-06-30 DIAGNOSIS — I10 Essential (primary) hypertension: Secondary | ICD-10-CM | POA: Diagnosis not present

## 2018-06-30 DIAGNOSIS — M1A09X0 Idiopathic chronic gout, multiple sites, without tophus (tophi): Secondary | ICD-10-CM | POA: Diagnosis not present

## 2018-06-30 DIAGNOSIS — I119 Hypertensive heart disease without heart failure: Secondary | ICD-10-CM | POA: Diagnosis not present

## 2018-06-30 DIAGNOSIS — G2581 Restless legs syndrome: Secondary | ICD-10-CM | POA: Diagnosis not present

## 2018-06-30 DIAGNOSIS — N433 Hydrocele, unspecified: Secondary | ICD-10-CM | POA: Diagnosis not present

## 2018-06-30 DIAGNOSIS — K219 Gastro-esophageal reflux disease without esophagitis: Secondary | ICD-10-CM | POA: Diagnosis not present

## 2018-06-30 DIAGNOSIS — E782 Mixed hyperlipidemia: Secondary | ICD-10-CM | POA: Diagnosis not present

## 2018-06-30 DIAGNOSIS — Z125 Encounter for screening for malignant neoplasm of prostate: Secondary | ICD-10-CM | POA: Diagnosis not present

## 2018-06-30 DIAGNOSIS — M5414 Radiculopathy, thoracic region: Secondary | ICD-10-CM | POA: Diagnosis not present

## 2018-06-30 DIAGNOSIS — I517 Cardiomegaly: Secondary | ICD-10-CM | POA: Diagnosis not present

## 2018-06-30 DIAGNOSIS — M150 Primary generalized (osteo)arthritis: Secondary | ICD-10-CM | POA: Diagnosis not present

## 2018-06-30 DIAGNOSIS — D126 Benign neoplasm of colon, unspecified: Secondary | ICD-10-CM | POA: Diagnosis not present

## 2018-06-30 DIAGNOSIS — E1142 Type 2 diabetes mellitus with diabetic polyneuropathy: Secondary | ICD-10-CM | POA: Diagnosis not present

## 2018-06-30 DIAGNOSIS — Z0000 Encounter for general adult medical examination without abnormal findings: Secondary | ICD-10-CM | POA: Diagnosis not present

## 2018-06-30 DIAGNOSIS — E538 Deficiency of other specified B group vitamins: Secondary | ICD-10-CM | POA: Diagnosis not present

## 2018-06-30 DIAGNOSIS — Z23 Encounter for immunization: Secondary | ICD-10-CM | POA: Diagnosis not present

## 2018-07-02 DIAGNOSIS — G4733 Obstructive sleep apnea (adult) (pediatric): Secondary | ICD-10-CM | POA: Diagnosis not present

## 2018-09-18 DIAGNOSIS — L237 Allergic contact dermatitis due to plants, except food: Secondary | ICD-10-CM | POA: Diagnosis not present

## 2018-09-26 NOTE — Progress Notes (Signed)
Patient ID: Samuel Hodge, male   DOB: 01-13-1951, 67 y.o.   MRN: [id number]   Patient presents for follow from brace pick up. Patient is doing well.

## 2018-09-30 DIAGNOSIS — I10 Essential (primary) hypertension: Secondary | ICD-10-CM | POA: Diagnosis not present

## 2018-09-30 DIAGNOSIS — E6609 Other obesity due to excess calories: Secondary | ICD-10-CM | POA: Diagnosis not present

## 2018-09-30 DIAGNOSIS — E1142 Type 2 diabetes mellitus with diabetic polyneuropathy: Secondary | ICD-10-CM | POA: Diagnosis not present

## 2018-09-30 DIAGNOSIS — Z6831 Body mass index (BMI) 31.0-31.9, adult: Secondary | ICD-10-CM | POA: Diagnosis not present

## 2018-09-30 DIAGNOSIS — Z79899 Other long term (current) drug therapy: Secondary | ICD-10-CM | POA: Diagnosis not present

## 2018-10-01 DIAGNOSIS — E1142 Type 2 diabetes mellitus with diabetic polyneuropathy: Secondary | ICD-10-CM | POA: Diagnosis not present

## 2018-10-03 DIAGNOSIS — E119 Type 2 diabetes mellitus without complications: Secondary | ICD-10-CM | POA: Diagnosis not present

## 2018-11-21 DIAGNOSIS — Z96651 Presence of right artificial knee joint: Secondary | ICD-10-CM | POA: Diagnosis not present

## 2018-11-21 DIAGNOSIS — Z96659 Presence of unspecified artificial knee joint: Secondary | ICD-10-CM | POA: Insufficient documentation

## 2018-11-21 DIAGNOSIS — Z471 Aftercare following joint replacement surgery: Secondary | ICD-10-CM | POA: Diagnosis not present

## 2018-12-11 DIAGNOSIS — M25572 Pain in left ankle and joints of left foot: Secondary | ICD-10-CM | POA: Diagnosis not present

## 2018-12-11 DIAGNOSIS — M19072 Primary osteoarthritis, left ankle and foot: Secondary | ICD-10-CM | POA: Diagnosis not present

## 2018-12-31 DIAGNOSIS — I10 Essential (primary) hypertension: Secondary | ICD-10-CM | POA: Diagnosis not present

## 2018-12-31 DIAGNOSIS — Z79899 Other long term (current) drug therapy: Secondary | ICD-10-CM | POA: Diagnosis not present

## 2018-12-31 DIAGNOSIS — E1142 Type 2 diabetes mellitus with diabetic polyneuropathy: Secondary | ICD-10-CM | POA: Diagnosis not present

## 2018-12-31 DIAGNOSIS — E782 Mixed hyperlipidemia: Secondary | ICD-10-CM | POA: Diagnosis not present

## 2019-01-01 DIAGNOSIS — G4733 Obstructive sleep apnea (adult) (pediatric): Secondary | ICD-10-CM | POA: Diagnosis not present

## 2019-01-09 DIAGNOSIS — M19072 Primary osteoarthritis, left ankle and foot: Secondary | ICD-10-CM | POA: Diagnosis not present

## 2019-02-24 DIAGNOSIS — R202 Paresthesia of skin: Secondary | ICD-10-CM | POA: Diagnosis not present

## 2019-02-24 DIAGNOSIS — R200 Anesthesia of skin: Secondary | ICD-10-CM | POA: Diagnosis not present

## 2019-02-24 DIAGNOSIS — G2581 Restless legs syndrome: Secondary | ICD-10-CM | POA: Diagnosis not present

## 2019-02-24 DIAGNOSIS — Z9989 Dependence on other enabling machines and devices: Secondary | ICD-10-CM | POA: Diagnosis not present

## 2019-02-24 DIAGNOSIS — G4733 Obstructive sleep apnea (adult) (pediatric): Secondary | ICD-10-CM | POA: Diagnosis not present

## 2019-02-27 DIAGNOSIS — E1142 Type 2 diabetes mellitus with diabetic polyneuropathy: Secondary | ICD-10-CM | POA: Diagnosis not present

## 2019-02-27 DIAGNOSIS — Z6830 Body mass index (BMI) 30.0-30.9, adult: Secondary | ICD-10-CM | POA: Diagnosis not present

## 2019-02-27 DIAGNOSIS — E6609 Other obesity due to excess calories: Secondary | ICD-10-CM | POA: Diagnosis not present

## 2019-02-27 DIAGNOSIS — M150 Primary generalized (osteo)arthritis: Secondary | ICD-10-CM | POA: Diagnosis not present

## 2019-02-27 DIAGNOSIS — I10 Essential (primary) hypertension: Secondary | ICD-10-CM | POA: Diagnosis not present

## 2019-03-03 DIAGNOSIS — L570 Actinic keratosis: Secondary | ICD-10-CM | POA: Diagnosis not present

## 2019-03-25 DIAGNOSIS — E1142 Type 2 diabetes mellitus with diabetic polyneuropathy: Secondary | ICD-10-CM | POA: Diagnosis not present

## 2019-04-21 DIAGNOSIS — G4733 Obstructive sleep apnea (adult) (pediatric): Secondary | ICD-10-CM | POA: Diagnosis not present

## 2019-04-23 DIAGNOSIS — G4733 Obstructive sleep apnea (adult) (pediatric): Secondary | ICD-10-CM | POA: Diagnosis not present

## 2019-04-27 DIAGNOSIS — M216X2 Other acquired deformities of left foot: Secondary | ICD-10-CM | POA: Diagnosis not present

## 2019-04-27 DIAGNOSIS — M19072 Primary osteoarthritis, left ankle and foot: Secondary | ICD-10-CM | POA: Diagnosis not present

## 2019-04-27 DIAGNOSIS — M216X9 Other acquired deformities of unspecified foot: Secondary | ICD-10-CM | POA: Insufficient documentation

## 2019-05-19 DIAGNOSIS — E119 Type 2 diabetes mellitus without complications: Secondary | ICD-10-CM | POA: Diagnosis not present

## 2019-05-27 DIAGNOSIS — K591 Functional diarrhea: Secondary | ICD-10-CM | POA: Diagnosis not present

## 2019-05-27 DIAGNOSIS — K219 Gastro-esophageal reflux disease without esophagitis: Secondary | ICD-10-CM | POA: Diagnosis not present

## 2019-06-19 DIAGNOSIS — D649 Anemia, unspecified: Secondary | ICD-10-CM | POA: Diagnosis not present

## 2019-06-19 DIAGNOSIS — E119 Type 2 diabetes mellitus without complications: Secondary | ICD-10-CM | POA: Diagnosis not present

## 2019-06-19 DIAGNOSIS — D126 Benign neoplasm of colon, unspecified: Secondary | ICD-10-CM | POA: Diagnosis not present

## 2019-06-19 DIAGNOSIS — Z7984 Long term (current) use of oral hypoglycemic drugs: Secondary | ICD-10-CM | POA: Diagnosis not present

## 2019-06-19 DIAGNOSIS — K635 Polyp of colon: Secondary | ICD-10-CM | POA: Diagnosis not present

## 2019-06-19 DIAGNOSIS — K644 Residual hemorrhoidal skin tags: Secondary | ICD-10-CM | POA: Diagnosis not present

## 2019-06-19 DIAGNOSIS — Z86010 Personal history of colonic polyps: Secondary | ICD-10-CM | POA: Diagnosis not present

## 2019-06-19 DIAGNOSIS — D124 Benign neoplasm of descending colon: Secondary | ICD-10-CM | POA: Diagnosis not present

## 2019-06-19 DIAGNOSIS — K648 Other hemorrhoids: Secondary | ICD-10-CM | POA: Diagnosis not present

## 2019-06-19 DIAGNOSIS — Z79899 Other long term (current) drug therapy: Secondary | ICD-10-CM | POA: Diagnosis not present

## 2019-07-02 DIAGNOSIS — E782 Mixed hyperlipidemia: Secondary | ICD-10-CM | POA: Diagnosis not present

## 2019-07-02 DIAGNOSIS — G2581 Restless legs syndrome: Secondary | ICD-10-CM | POA: Diagnosis not present

## 2019-07-02 DIAGNOSIS — E6609 Other obesity due to excess calories: Secondary | ICD-10-CM | POA: Diagnosis not present

## 2019-07-02 DIAGNOSIS — G629 Polyneuropathy, unspecified: Secondary | ICD-10-CM | POA: Diagnosis not present

## 2019-07-02 DIAGNOSIS — Z125 Encounter for screening for malignant neoplasm of prostate: Secondary | ICD-10-CM | POA: Diagnosis not present

## 2019-07-02 DIAGNOSIS — M1A09X0 Idiopathic chronic gout, multiple sites, without tophus (tophi): Secondary | ICD-10-CM | POA: Diagnosis not present

## 2019-07-02 DIAGNOSIS — I119 Hypertensive heart disease without heart failure: Secondary | ICD-10-CM | POA: Diagnosis not present

## 2019-07-02 DIAGNOSIS — K219 Gastro-esophageal reflux disease without esophagitis: Secondary | ICD-10-CM | POA: Diagnosis not present

## 2019-07-02 DIAGNOSIS — M19031 Primary osteoarthritis, right wrist: Secondary | ICD-10-CM | POA: Diagnosis not present

## 2019-07-02 DIAGNOSIS — E538 Deficiency of other specified B group vitamins: Secondary | ICD-10-CM | POA: Diagnosis not present

## 2019-07-02 DIAGNOSIS — M8949 Other hypertrophic osteoarthropathy, multiple sites: Secondary | ICD-10-CM | POA: Diagnosis not present

## 2019-07-02 DIAGNOSIS — G4733 Obstructive sleep apnea (adult) (pediatric): Secondary | ICD-10-CM | POA: Diagnosis not present

## 2019-07-02 DIAGNOSIS — E1142 Type 2 diabetes mellitus with diabetic polyneuropathy: Secondary | ICD-10-CM | POA: Diagnosis not present

## 2019-07-02 DIAGNOSIS — Z0000 Encounter for general adult medical examination without abnormal findings: Secondary | ICD-10-CM | POA: Diagnosis not present

## 2019-07-08 DIAGNOSIS — Z8639 Personal history of other endocrine, nutritional and metabolic disease: Secondary | ICD-10-CM | POA: Insufficient documentation

## 2019-07-08 DIAGNOSIS — E611 Iron deficiency: Secondary | ICD-10-CM | POA: Insufficient documentation

## 2019-07-15 DIAGNOSIS — M19031 Primary osteoarthritis, right wrist: Secondary | ICD-10-CM | POA: Diagnosis not present

## 2019-07-15 DIAGNOSIS — M25531 Pain in right wrist: Secondary | ICD-10-CM | POA: Diagnosis not present

## 2019-07-16 DIAGNOSIS — M19039 Primary osteoarthritis, unspecified wrist: Secondary | ICD-10-CM | POA: Insufficient documentation

## 2019-07-23 DIAGNOSIS — G4733 Obstructive sleep apnea (adult) (pediatric): Secondary | ICD-10-CM | POA: Diagnosis not present

## 2019-08-05 ENCOUNTER — Ambulatory Visit (INDEPENDENT_AMBULATORY_CARE_PROVIDER_SITE_OTHER): Payer: PPO | Admitting: Sports Medicine

## 2019-08-05 ENCOUNTER — Encounter: Payer: Self-pay | Admitting: Sports Medicine

## 2019-08-05 ENCOUNTER — Other Ambulatory Visit: Payer: Self-pay

## 2019-08-05 DIAGNOSIS — B351 Tinea unguium: Secondary | ICD-10-CM

## 2019-08-05 DIAGNOSIS — E1142 Type 2 diabetes mellitus with diabetic polyneuropathy: Secondary | ICD-10-CM

## 2019-08-05 DIAGNOSIS — M79676 Pain in unspecified toe(s): Secondary | ICD-10-CM | POA: Diagnosis not present

## 2019-08-05 NOTE — Progress Notes (Signed)
Subjective: Samuel Hodge is a 68 y.o. male patient with history of diabetes who presents to office today complaining of long,mildly painful nails  while ambulating in shoes; unable to trim. Patient states that the glucose reading this morning was not recorded but yesterday was 134.  Patient denies any new changes in medication or new problems. Patient denies any new cramping, numbness, burning or tingling in the legs.  Admits that he is awaiting orthopedic evaluation for left total ankle replacement versus fusion reports a doctor who it may try to do it once he gets his A1 C understanding currently at 7.9.  Patient denies any other pedal complaints at this time.  Patient Active Problem List   Diagnosis Date Noted  . Vitamin B12 deficiency 12/13/2016  . Chronic gout of multiple sites 04/04/2016  . Chronic gouty arthritis 04/04/2016  . Gastroesophageal reflux disease without esophagitis 04/04/2016  . Mixed hyperlipidemia 04/04/2016  . Multiple-type hyperlipidemia 04/04/2016  . Benign neoplasm of colon 04/03/2016  . Cardiac arrhythmia 04/03/2016  . Cardiac enlargement 04/03/2016  . Cardiomegaly 04/03/2016  . Diabetes, polyneuropathy ([place]) 04/03/2016  . Diabetic polyneuropathy ([place]) 04/03/2016  . Essential hypertension 04/03/2016  . Generalized OA 04/03/2016  . Hearing loss 04/03/2016  . High risk medication use 04/03/2016  . Hydrocele of testis 04/03/2016  . Hypogonadism in male 04/03/2016  . Obesity (BMI 30-39.9) 04/03/2016  . Peripheral neuropathy 04/03/2016  . Primary osteoarthritis involving multiple joints 04/03/2016  . RLS (restless legs syndrome) 04/03/2016  . Thoracic radiculopathy 04/03/2016  . Thoracic root lesion 04/03/2016  . Primary osteoarthritis of right knee 10/10/2015  . Restless legs syndrome 12/14/2013  . Dyspnea and respiratory abnormality 02/16/2013   Current Outpatient Medications on File Prior to Visit  Medication Sig Dispense Refill  . empagliflozin  (JARDIANCE) 25 MG TABS tablet Jardiance 25 mg tablet    . amLODipine (NORVASC) 5 MG tablet Take 5 mg by mouth daily.    Marland Kitchen apixaban (ELIQUIS) 2.5 MG TABS tablet Take 1 tablet (2.5 mg total) by mouth every 12 (twelve) hours. 60 tablet 0  . azithromycin (ZITHROMAX) 250 MG tablet     . CLOBETASOL PROPIONATE E 0.05 % emollient cream     . cyclobenzaprine (FLEXERIL) 10 MG tablet Take 10 mg by mouth.    . docusate sodium (COLACE) 100 MG capsule Take 1 capsule (100 mg total) by mouth 2 (two) times daily. (Patient taking differently: Take 100 mg by mouth 2 (two) times daily as needed for mild constipation. ) 60 capsule 3  . doxycycline (VIBRA-TABS) 100 MG tablet Take 1 tablet (100 mg total) by mouth 2 (two) times daily. 20 tablet 0  . gabapentin (NEURONTIN) 600 MG tablet Take 1,200 mg by mouth 2 (two) times daily.     . Gabapentin Enacarbil 600 MG TBCR Take 1 pill with evening meal    . glimepiride (AMARYL) 1 MG tablet Take 1 mg by mouth daily with breakfast.    . glucose blood (FREESTYLE LITE) test strip USE 1 STRIP EVERY DAY    . hydrochlorothiazide (HYDRODIURIL) 25 MG tablet Take 25 mg by mouth daily.    Marland Kitchen HYDROcodone-acetaminophen (NORCO) 10-325 MG tablet Take 1 tablet by mouth every 6 (six) hours as needed.    Marland Kitchen HYDROcodone-acetaminophen (NORCO) 5-325 MG tablet Take 1-2 tablets by mouth every 4 (four) hours as needed for moderate pain. 90 tablet 0  . Lancets (FREESTYLE) lancets CHECK BLOOD SUGAR EVERY DAY AS DIRECTED    . latanoprost (XALATAN) 0.005 %  ophthalmic solution     . latanoprost (XALATAN) 0.005 % ophthalmic solution Place 0.005 drops into both eyes daily.    Marland Kitchen lisinopril (PRINIVIL,ZESTRIL) 20 MG tablet Take 30 mg by mouth daily.    . metFORMIN (GLUCOPHAGE-XR) 500 MG 24 hr tablet Take 1,000 mg by mouth 2 (two) times daily.    . ondansetron (ZOFRAN) 4 MG tablet Take 1 tablet (4 mg total) by mouth every 6 (six) hours as needed for nausea. 20 tablet 0  . oxyCODONE (OXYCONTIN) 10 mg 12 hr  tablet Take 1 tablet (10 mg total) by mouth PRO. 1 tab PO every 12 hours for 3 days, then 1 tab PO daily for 4 days 10 tablet 0  . pantoprazole (PROTONIX) 40 MG tablet Take 40 mg by mouth daily.    . promethazine (PHENERGAN) 25 MG tablet Take 25 mg by mouth every 6 (six) hours as needed for nausea or vomiting.    Marland Kitchen rOPINIRole (REQUIP XL) 8 MG 24 hr tablet Take 8 mg by mouth at bedtime.    . rosuvastatin (CRESTOR) 10 MG tablet Take 10 mg by mouth.    . senna (SENOKOT) 8.6 MG TABS tablet Take 2 tablets (17.2 mg total) by mouth at bedtime. 60 each 3  . terbinafine (LAMISIL) 250 MG tablet      Current Facility-Administered Medications on File Prior to Visit  Medication Dose Route Frequency Provider Last Rate Last Dose  . triamcinolone acetonide (KENALOG) 10 MG/ML injection 10 mg  10 mg Other Once [place], Prentiss Hammett, DPM      . triamcinolone acetonide (KENALOG-40) injection 20 mg  20 mg Other Once Landis Martins, DPM       Allergies  Allergen Reactions  . Aleve [Naproxen Sodium] Hives  . Aspirin Other (See Comments)    Unsure Unknown "childhood allergy"  . Penicillins Other (See Comments)    Unsure All pt remembers is nose bleeds associated with this reaction.  Has patient had a PCN reaction causing immediate rash, facial/tongue/throat swelling, SOB or lightheadedness with hypotension: Unknown Has patient had a PCN reaction causing severe rash involving mucus membranes or skin necrosis: Unknown Has patient had a PCN reaction that required hospitalization: Yes Has patient had a PCN reaction occurring within the last 10 years: No If all of the above answers are "NO", then may proceed with Cepha  . Latex Rash    No results found for this or any previous visit (from the past 2160 hour(s)).  Objective: General: Patient is awake, alert, and oriented x 3 and in no acute distress.  Integument: Skin is warm, dry and supple bilateral. Nails are tender, long, thickened and  dystrophic with  subungual debris, consistent with onychomycosis, 1-5 bilateral.  There is fragments of nails noted bilateral hallux.  No signs of infection. No open lesions or preulcerative lesions present bilateral. Remaining integument unremarkable.  Vasculature:  Dorsalis Pedis pulse 1/4 bilateral. Posterior Tibial pulse  1/4 bilateral.  Capillary fill time <3 sec 1-5 bilateral. Positive hair growth to the level of the digits. Temperature gradient within normal limits. No varicosities present bilateral. No edema present bilateral.   Neurology: The patient has intact sensation measured with a 5.07/10g Semmes Weinstein Monofilament at all pedal sites bilateral . Vibratory sensation diminished bilateral with tuning fork. No Babinski sign present bilateral.   Musculoskeletal: Severe ankle deformity control for now with AFO patient awaiting orthopedic follow-up for total ankle replacement versus fusion on the left.  Assessment and Plan: Problem List Items Addressed This  Visit      Nervous and Auditory   Diabetic polyneuropathy ([facility])   Relevant Medications   empagliflozin (JARDIANCE) 25 MG TABS tablet    Other Visit Diagnoses    Pain due to onychomycosis of nail    -  Primary      -Examined patient. -Discussed and educated patient on diabetic foot care, especially with  regards to the vascular, neurological and musculoskeletal systems.  -Stressed the importance of good glycemic control and the detriment of not  controlling glucose levels in relation to the foot. -Mechanically debrided all nails 1-5 bilateral using sterile nail nipper and filed with dremel without incident 4 -Continue with AFO on left with orthopedic follow-up for total ankle replacement -Answered all patient questions -Patient to return  in 3 months for at risk foot care -Patient advised to call the office if any problems or questions arise in the meantime.  Landis Martins, DPM

## 2019-08-20 DIAGNOSIS — H401131 Primary open-angle glaucoma, bilateral, mild stage: Secondary | ICD-10-CM | POA: Diagnosis not present

## 2019-09-10 DIAGNOSIS — L300 Nummular dermatitis: Secondary | ICD-10-CM | POA: Diagnosis not present

## 2019-09-10 DIAGNOSIS — L299 Pruritus, unspecified: Secondary | ICD-10-CM | POA: Diagnosis not present

## 2019-09-29 DIAGNOSIS — E1142 Type 2 diabetes mellitus with diabetic polyneuropathy: Secondary | ICD-10-CM | POA: Diagnosis not present

## 2019-09-29 DIAGNOSIS — E6609 Other obesity due to excess calories: Secondary | ICD-10-CM | POA: Diagnosis not present

## 2019-09-29 DIAGNOSIS — I10 Essential (primary) hypertension: Secondary | ICD-10-CM | POA: Diagnosis not present

## 2019-09-29 DIAGNOSIS — K219 Gastro-esophageal reflux disease without esophagitis: Secondary | ICD-10-CM | POA: Diagnosis not present

## 2019-09-29 DIAGNOSIS — Z6830 Body mass index (BMI) 30.0-30.9, adult: Secondary | ICD-10-CM | POA: Diagnosis not present

## 2019-09-29 DIAGNOSIS — E611 Iron deficiency: Secondary | ICD-10-CM | POA: Diagnosis not present

## 2019-10-02 DIAGNOSIS — G4733 Obstructive sleep apnea (adult) (pediatric): Secondary | ICD-10-CM | POA: Diagnosis not present

## 2019-10-29 ENCOUNTER — Ambulatory Visit (INDEPENDENT_AMBULATORY_CARE_PROVIDER_SITE_OTHER): Payer: PPO | Admitting: Sports Medicine

## 2019-10-29 ENCOUNTER — Other Ambulatory Visit: Payer: Self-pay

## 2019-10-29 ENCOUNTER — Encounter: Payer: Self-pay | Admitting: Sports Medicine

## 2019-10-29 DIAGNOSIS — B351 Tinea unguium: Secondary | ICD-10-CM | POA: Diagnosis not present

## 2019-10-29 DIAGNOSIS — M21962 Unspecified acquired deformity of left lower leg: Secondary | ICD-10-CM

## 2019-10-29 DIAGNOSIS — M79676 Pain in unspecified toe(s): Secondary | ICD-10-CM

## 2019-10-29 DIAGNOSIS — M779 Enthesopathy, unspecified: Secondary | ICD-10-CM | POA: Diagnosis not present

## 2019-10-29 DIAGNOSIS — E1142 Type 2 diabetes mellitus with diabetic polyneuropathy: Secondary | ICD-10-CM

## 2019-10-29 DIAGNOSIS — M19072 Primary osteoarthritis, left ankle and foot: Secondary | ICD-10-CM

## 2019-10-29 DIAGNOSIS — M216X9 Other acquired deformities of unspecified foot: Secondary | ICD-10-CM

## 2019-10-29 MED ORDER — TRIAMCINOLONE ACETONIDE 10 MG/ML IJ SUSP
10.0000 mg | Freq: Once | INTRAMUSCULAR | Status: AC
Start: 2019-10-29 — End: 2019-10-29
  Administered 2019-10-29: 10 mg

## 2019-10-29 NOTE — Progress Notes (Signed)
Subjective: Samuel Hodge is a 68 y.o. male patient with history of diabetes who presents to office today complaining of long,mildly painful nails  while ambulating in shoes; unable to trim. Patient states that the glucose reading this morning was not recorded but yesterday was 128.  A1C 7.9. Reports some rubbing with brace and some pain to lateral foot and ankle. Patient denies any other pedal complaints at this time.  Patient Active Problem List   Diagnosis Date Noted  . Vitamin B12 deficiency 12/13/2016  . Chronic gout of multiple sites 04/04/2016  . Chronic gouty arthritis 04/04/2016  . Gastroesophageal reflux disease without esophagitis 04/04/2016  . Mixed hyperlipidemia 04/04/2016  . Multiple-type hyperlipidemia 04/04/2016  . Benign neoplasm of colon 04/03/2016  . Cardiac arrhythmia 04/03/2016  . Cardiac enlargement 04/03/2016  . Cardiomegaly 04/03/2016  . Diabetes, polyneuropathy ([place]) 04/03/2016  . Diabetic polyneuropathy ([place]) 04/03/2016  . Essential hypertension 04/03/2016  . Generalized OA 04/03/2016  . Hearing loss 04/03/2016  . High risk medication use 04/03/2016  . Hydrocele of testis 04/03/2016  . Hypogonadism in male 04/03/2016  . Obesity (BMI 30-39.9) 04/03/2016  . Peripheral neuropathy 04/03/2016  . Primary osteoarthritis involving multiple joints 04/03/2016  . RLS (restless legs syndrome) 04/03/2016  . Thoracic radiculopathy 04/03/2016  . Thoracic root lesion 04/03/2016  . Primary osteoarthritis of right knee 10/10/2015  . Restless legs syndrome 12/14/2013  . Dyspnea and respiratory abnormality 02/16/2013   Current Outpatient Medications on File Prior to Visit  Medication Sig Dispense Refill  . amLODipine (NORVASC) 5 MG tablet Take 5 mg by mouth daily.    Marland Kitchen apixaban (ELIQUIS) 2.5 MG TABS tablet Take 1 tablet (2.5 mg total) by mouth every 12 (twelve) hours. 60 tablet 0  . azithromycin (ZITHROMAX) 250 MG tablet     . CLOBETASOL PROPIONATE E 0.05 % emollient  cream     . cyclobenzaprine (FLEXERIL) 10 MG tablet Take 10 mg by mouth.    . docusate sodium (COLACE) 100 MG capsule Take 1 capsule (100 mg total) by mouth 2 (two) times daily. (Patient taking differently: Take 100 mg by mouth 2 (two) times daily as needed for mild constipation. ) 60 capsule 3  . doxycycline (VIBRA-TABS) 100 MG tablet Take 1 tablet (100 mg total) by mouth 2 (two) times daily. 20 tablet 0  . empagliflozin (JARDIANCE) 25 MG TABS tablet Jardiance 25 mg tablet    . gabapentin (NEURONTIN) 600 MG tablet Take 1,200 mg by mouth 2 (two) times daily.     . Gabapentin Enacarbil 600 MG TBCR Take 1 pill with evening meal    . glimepiride (AMARYL) 1 MG tablet Take 1 mg by mouth daily with breakfast.    . glucose blood (FREESTYLE LITE) test strip USE 1 STRIP EVERY DAY    . hydrochlorothiazide (HYDRODIURIL) 25 MG tablet Take 25 mg by mouth daily.    Marland Kitchen HYDROcodone-acetaminophen (NORCO) 10-325 MG tablet Take 1 tablet by mouth every 6 (six) hours as needed.    Marland Kitchen HYDROcodone-acetaminophen (NORCO) 5-325 MG tablet Take 1-2 tablets by mouth every 4 (four) hours as needed for moderate pain. 90 tablet 0  . Lancets (FREESTYLE) lancets CHECK BLOOD SUGAR EVERY DAY AS DIRECTED    . latanoprost (XALATAN) 0.005 % ophthalmic solution     . latanoprost (XALATAN) 0.005 % ophthalmic solution Place 0.005 drops into both eyes daily.    Marland Kitchen lisinopril (PRINIVIL,ZESTRIL) 20 MG tablet Take 30 mg by mouth daily.    . metFORMIN (GLUCOPHAGE-XR) 500 MG  24 hr tablet Take 1,000 mg by mouth 2 (two) times daily.    . ondansetron (ZOFRAN) 4 MG tablet Take 1 tablet (4 mg total) by mouth every 6 (six) hours as needed for nausea. 20 tablet 0  . oxyCODONE (OXYCONTIN) 10 mg 12 hr tablet Take 1 tablet (10 mg total) by mouth PRO. 1 tab PO every 12 hours for 3 days, then 1 tab PO daily for 4 days 10 tablet 0  . pantoprazole (PROTONIX) 40 MG tablet Take 40 mg by mouth daily.    . promethazine (PHENERGAN) 25 MG tablet Take 25 mg by mouth  every 6 (six) hours as needed for nausea or vomiting.    Marland Kitchen rOPINIRole (REQUIP XL) 8 MG 24 hr tablet Take 8 mg by mouth at bedtime.    . rosuvastatin (CRESTOR) 10 MG tablet Take 10 mg by mouth.    . senna (SENOKOT) 8.6 MG TABS tablet Take 2 tablets (17.2 mg total) by mouth at bedtime. 60 each 3  . terbinafine (LAMISIL) 250 MG tablet      Current Facility-Administered Medications on File Prior to Visit  Medication Dose Route Frequency Provider Last Rate Last Admin  . triamcinolone acetonide (KENALOG) 10 MG/ML injection 10 mg  10 mg Other Once [place], [place], DPM      . triamcinolone acetonide (KENALOG-40) injection 20 mg  20 mg Other Once Landis Martins, DPM       Allergies  Allergen Reactions  . Aleve [Naproxen Sodium] Hives  . Aspirin Other (See Comments)    Unsure Unknown "childhood allergy"  . Penicillins Other (See Comments)    Unsure All pt remembers is nose bleeds associated with this reaction.  Has patient had a PCN reaction causing immediate rash, facial/tongue/throat swelling, SOB or lightheadedness with hypotension: Unknown Has patient had a PCN reaction causing severe rash involving mucus membranes or skin necrosis: Unknown Has patient had a PCN reaction that required hospitalization: Yes Has patient had a PCN reaction occurring within the last 10 years: No If all of the above answers are "NO", then may proceed with Cepha  . Latex Rash    No results found for this or any previous visit (from the past 2160 hour(s)).  Objective: General: Patient is awake, alert, and oriented x 3 and in no acute distress.  Integument: Skin is warm, dry and supple bilateral. Nails are tender, long, thickened and  dystrophic with subungual debris, consistent with onychomycosis, 1-5 bilateral.  There is fragments of nails noted bilateral hallux.  No signs of infection. No open lesions or preulcerative lesions present bilateral. Remaining integument unremarkable.  Vasculature:  Dorsalis Pedis  pulse 1/4 bilateral. Posterior Tibial pulse  1/4 bilateral.  Capillary fill time <3 sec 1-5 bilateral. Positive hair growth to the level of the digits. Temperature gradient within normal limits. No varicosities present bilateral. No edema present bilateral.   Neurology: The patient has intact sensation measured with a 5.07/10g Semmes Weinstein Monofilament at all pedal sites bilateral . Vibratory sensation diminished bilateral with tuning fork. No Babinski sign present bilateral.   Musculoskeletal: + Pain at 5th met base on left. Severe ankle deformity control for now with AFO patient awaiting orthopedic follow-up for total ankle replacement versus fusion on the left.  Assessment and Plan: Problem List Items Addressed This Visit      Endocrine   Diabetic polyneuropathy ([place])    Other Visit Diagnoses    Pain due to onychomycosis of nail    -  Primary  Cavus deformity of foot, acquired       Osteoarthritis of left ankle and foot       Ankle deformity, left         -Examined patient. -Re-Discussed and educated patient on diabetic foot care, especially with  regards to the vascular, neurological and musculoskeletal systems.  -Stressed the importance of good glycemic control and the detriment of not  controlling glucose levels in relation to the foot. -Mechanically debrided all nails 1-5 bilateral using sterile nail nipper and filed with dremel without incident 4 -After oral consent and aseptic prep, injected a mixture containing 1 ml of 2%  plain lidocaine, 1 ml 0.5% plain marcaine, 0.5 ml of kenalog 10 and 0.5 ml of dexamethasone phosphate into left 5th base without complication. Post-injection care discussed with patient.  -Recommend patient to f/u with Liliane Channel regarding brace and diabetic shoes; today added felt to prevent rubbing  -Continue with AFO on left with orthopedic follow-up for total ankle replacement like previous  -Answered all patient questions -Patient to return  in 3  months for at risk foot care -Patient advised to call the office if any problems or questions arise in the meantime.  Landis Martins, DPM

## 2019-11-18 DIAGNOSIS — R509 Fever, unspecified: Secondary | ICD-10-CM | POA: Diagnosis not present

## 2019-11-18 DIAGNOSIS — Z20828 Contact with and (suspected) exposure to other viral communicable diseases: Secondary | ICD-10-CM | POA: Diagnosis not present

## 2019-11-18 DIAGNOSIS — R05 Cough: Secondary | ICD-10-CM | POA: Diagnosis not present

## 2019-11-18 DIAGNOSIS — R5383 Other fatigue: Secondary | ICD-10-CM | POA: Diagnosis not present

## 2019-11-18 DIAGNOSIS — R0981 Nasal congestion: Secondary | ICD-10-CM | POA: Diagnosis not present

## 2019-11-25 ENCOUNTER — Other Ambulatory Visit: Payer: PPO | Admitting: Orthotics

## 2019-11-26 DIAGNOSIS — Z792 Long term (current) use of antibiotics: Secondary | ICD-10-CM | POA: Diagnosis not present

## 2019-11-26 DIAGNOSIS — E785 Hyperlipidemia, unspecified: Secondary | ICD-10-CM | POA: Diagnosis not present

## 2019-11-26 DIAGNOSIS — M1990 Unspecified osteoarthritis, unspecified site: Secondary | ICD-10-CM | POA: Diagnosis not present

## 2019-11-26 DIAGNOSIS — R0600 Dyspnea, unspecified: Secondary | ICD-10-CM | POA: Diagnosis not present

## 2019-11-26 DIAGNOSIS — Z7952 Long term (current) use of systemic steroids: Secondary | ICD-10-CM | POA: Diagnosis not present

## 2019-11-26 DIAGNOSIS — J9600 Acute respiratory failure, unspecified whether with hypoxia or hypercapnia: Secondary | ICD-10-CM | POA: Diagnosis not present

## 2019-11-26 DIAGNOSIS — R748 Abnormal levels of other serum enzymes: Secondary | ICD-10-CM | POA: Diagnosis not present

## 2019-11-26 DIAGNOSIS — J440 Chronic obstructive pulmonary disease with acute lower respiratory infection: Secondary | ICD-10-CM | POA: Diagnosis not present

## 2019-11-26 DIAGNOSIS — U071 COVID-19: Secondary | ICD-10-CM

## 2019-11-26 DIAGNOSIS — K219 Gastro-esophageal reflux disease without esophagitis: Secondary | ICD-10-CM | POA: Diagnosis not present

## 2019-11-26 DIAGNOSIS — I119 Hypertensive heart disease without heart failure: Secondary | ICD-10-CM | POA: Diagnosis not present

## 2019-11-26 DIAGNOSIS — R0602 Shortness of breath: Secondary | ICD-10-CM | POA: Diagnosis not present

## 2019-11-26 DIAGNOSIS — J9601 Acute respiratory failure with hypoxia: Secondary | ICD-10-CM | POA: Diagnosis not present

## 2019-11-26 DIAGNOSIS — E876 Hypokalemia: Secondary | ICD-10-CM | POA: Diagnosis not present

## 2019-11-26 DIAGNOSIS — Z856 Personal history of leukemia: Secondary | ICD-10-CM | POA: Diagnosis not present

## 2019-11-26 DIAGNOSIS — J1282 Pneumonia due to coronavirus disease 2019: Secondary | ICD-10-CM | POA: Diagnosis not present

## 2019-11-26 DIAGNOSIS — G2581 Restless legs syndrome: Secondary | ICD-10-CM | POA: Diagnosis not present

## 2019-11-26 DIAGNOSIS — E119 Type 2 diabetes mellitus without complications: Secondary | ICD-10-CM | POA: Diagnosis not present

## 2019-11-26 DIAGNOSIS — M109 Gout, unspecified: Secondary | ICD-10-CM | POA: Diagnosis not present

## 2019-11-26 DIAGNOSIS — Z9181 History of falling: Secondary | ICD-10-CM | POA: Diagnosis not present

## 2019-11-26 DIAGNOSIS — R269 Unspecified abnormalities of gait and mobility: Secondary | ICD-10-CM | POA: Diagnosis not present

## 2019-11-26 DIAGNOSIS — Z9981 Dependence on supplemental oxygen: Secondary | ICD-10-CM | POA: Diagnosis not present

## 2019-11-26 DIAGNOSIS — Z7984 Long term (current) use of oral hypoglycemic drugs: Secondary | ICD-10-CM | POA: Diagnosis not present

## 2019-11-26 DIAGNOSIS — J159 Unspecified bacterial pneumonia: Secondary | ICD-10-CM | POA: Diagnosis not present

## 2019-11-26 DIAGNOSIS — E871 Hypo-osmolality and hyponatremia: Secondary | ICD-10-CM | POA: Diagnosis not present

## 2019-11-26 DIAGNOSIS — N179 Acute kidney failure, unspecified: Secondary | ICD-10-CM

## 2019-11-26 DIAGNOSIS — Z79899 Other long term (current) drug therapy: Secondary | ICD-10-CM | POA: Diagnosis not present

## 2019-11-26 DIAGNOSIS — I10 Essential (primary) hypertension: Secondary | ICD-10-CM | POA: Diagnosis not present

## 2019-11-26 DIAGNOSIS — E860 Dehydration: Secondary | ICD-10-CM | POA: Diagnosis not present

## 2019-11-26 DIAGNOSIS — Z96651 Presence of right artificial knee joint: Secondary | ICD-10-CM | POA: Diagnosis not present

## 2019-11-26 DIAGNOSIS — M1712 Unilateral primary osteoarthritis, left knee: Secondary | ICD-10-CM | POA: Diagnosis not present

## 2019-11-30 DIAGNOSIS — M109 Gout, unspecified: Secondary | ICD-10-CM | POA: Diagnosis not present

## 2019-11-30 DIAGNOSIS — E876 Hypokalemia: Secondary | ICD-10-CM | POA: Diagnosis not present

## 2019-11-30 DIAGNOSIS — Z7952 Long term (current) use of systemic steroids: Secondary | ICD-10-CM | POA: Diagnosis not present

## 2019-11-30 DIAGNOSIS — G2581 Restless legs syndrome: Secondary | ICD-10-CM | POA: Diagnosis not present

## 2019-11-30 DIAGNOSIS — K219 Gastro-esophageal reflux disease without esophagitis: Secondary | ICD-10-CM | POA: Diagnosis not present

## 2019-11-30 DIAGNOSIS — Z9181 History of falling: Secondary | ICD-10-CM | POA: Diagnosis not present

## 2019-11-30 DIAGNOSIS — Z9981 Dependence on supplemental oxygen: Secondary | ICD-10-CM | POA: Diagnosis not present

## 2019-11-30 DIAGNOSIS — J440 Chronic obstructive pulmonary disease with acute lower respiratory infection: Secondary | ICD-10-CM | POA: Diagnosis not present

## 2019-11-30 DIAGNOSIS — Z856 Personal history of leukemia: Secondary | ICD-10-CM | POA: Diagnosis not present

## 2019-11-30 DIAGNOSIS — N179 Acute kidney failure, unspecified: Secondary | ICD-10-CM | POA: Diagnosis not present

## 2019-11-30 DIAGNOSIS — Z96651 Presence of right artificial knee joint: Secondary | ICD-10-CM | POA: Diagnosis not present

## 2019-11-30 DIAGNOSIS — E871 Hypo-osmolality and hyponatremia: Secondary | ICD-10-CM | POA: Diagnosis not present

## 2019-11-30 DIAGNOSIS — Z792 Long term (current) use of antibiotics: Secondary | ICD-10-CM | POA: Diagnosis not present

## 2019-11-30 DIAGNOSIS — U071 COVID-19: Secondary | ICD-10-CM | POA: Diagnosis not present

## 2019-11-30 DIAGNOSIS — I119 Hypertensive heart disease without heart failure: Secondary | ICD-10-CM | POA: Diagnosis not present

## 2019-11-30 DIAGNOSIS — E119 Type 2 diabetes mellitus without complications: Secondary | ICD-10-CM | POA: Diagnosis not present

## 2019-11-30 DIAGNOSIS — Z7984 Long term (current) use of oral hypoglycemic drugs: Secondary | ICD-10-CM | POA: Diagnosis not present

## 2019-11-30 DIAGNOSIS — M1712 Unilateral primary osteoarthritis, left knee: Secondary | ICD-10-CM | POA: Diagnosis not present

## 2019-11-30 DIAGNOSIS — E785 Hyperlipidemia, unspecified: Secondary | ICD-10-CM | POA: Diagnosis not present

## 2019-11-30 DIAGNOSIS — J9601 Acute respiratory failure with hypoxia: Secondary | ICD-10-CM | POA: Diagnosis not present

## 2019-11-30 DIAGNOSIS — J1282 Pneumonia due to coronavirus disease 2019: Secondary | ICD-10-CM | POA: Diagnosis not present

## 2019-12-08 DIAGNOSIS — U071 COVID-19: Secondary | ICD-10-CM | POA: Diagnosis not present

## 2019-12-08 DIAGNOSIS — J1282 Pneumonia due to coronavirus disease 2019: Secondary | ICD-10-CM | POA: Diagnosis not present

## 2019-12-08 DIAGNOSIS — G4733 Obstructive sleep apnea (adult) (pediatric): Secondary | ICD-10-CM | POA: Diagnosis not present

## 2019-12-14 DIAGNOSIS — Z9181 History of falling: Secondary | ICD-10-CM | POA: Diagnosis not present

## 2019-12-14 DIAGNOSIS — M109 Gout, unspecified: Secondary | ICD-10-CM | POA: Diagnosis not present

## 2019-12-14 DIAGNOSIS — Z7952 Long term (current) use of systemic steroids: Secondary | ICD-10-CM | POA: Diagnosis not present

## 2019-12-14 DIAGNOSIS — E871 Hypo-osmolality and hyponatremia: Secondary | ICD-10-CM | POA: Diagnosis not present

## 2019-12-14 DIAGNOSIS — Z792 Long term (current) use of antibiotics: Secondary | ICD-10-CM | POA: Diagnosis not present

## 2019-12-14 DIAGNOSIS — K219 Gastro-esophageal reflux disease without esophagitis: Secondary | ICD-10-CM | POA: Diagnosis not present

## 2019-12-14 DIAGNOSIS — Z7984 Long term (current) use of oral hypoglycemic drugs: Secondary | ICD-10-CM | POA: Diagnosis not present

## 2019-12-14 DIAGNOSIS — I119 Hypertensive heart disease without heart failure: Secondary | ICD-10-CM | POA: Diagnosis not present

## 2019-12-14 DIAGNOSIS — Z856 Personal history of leukemia: Secondary | ICD-10-CM | POA: Diagnosis not present

## 2019-12-14 DIAGNOSIS — J1282 Pneumonia due to coronavirus disease 2019: Secondary | ICD-10-CM | POA: Diagnosis not present

## 2019-12-14 DIAGNOSIS — E876 Hypokalemia: Secondary | ICD-10-CM | POA: Diagnosis not present

## 2019-12-14 DIAGNOSIS — N179 Acute kidney failure, unspecified: Secondary | ICD-10-CM | POA: Diagnosis not present

## 2019-12-14 DIAGNOSIS — J440 Chronic obstructive pulmonary disease with acute lower respiratory infection: Secondary | ICD-10-CM | POA: Diagnosis not present

## 2019-12-14 DIAGNOSIS — E119 Type 2 diabetes mellitus without complications: Secondary | ICD-10-CM | POA: Diagnosis not present

## 2019-12-14 DIAGNOSIS — G2581 Restless legs syndrome: Secondary | ICD-10-CM | POA: Diagnosis not present

## 2019-12-14 DIAGNOSIS — U071 COVID-19: Secondary | ICD-10-CM | POA: Diagnosis not present

## 2019-12-14 DIAGNOSIS — Z96651 Presence of right artificial knee joint: Secondary | ICD-10-CM | POA: Diagnosis not present

## 2019-12-14 DIAGNOSIS — E785 Hyperlipidemia, unspecified: Secondary | ICD-10-CM | POA: Diagnosis not present

## 2019-12-14 DIAGNOSIS — J9601 Acute respiratory failure with hypoxia: Secondary | ICD-10-CM | POA: Diagnosis not present

## 2019-12-14 DIAGNOSIS — M1712 Unilateral primary osteoarthritis, left knee: Secondary | ICD-10-CM | POA: Diagnosis not present

## 2019-12-14 DIAGNOSIS — Z9981 Dependence on supplemental oxygen: Secondary | ICD-10-CM | POA: Diagnosis not present

## 2019-12-23 ENCOUNTER — Ambulatory Visit: Payer: PPO | Admitting: Orthotics

## 2019-12-23 ENCOUNTER — Other Ambulatory Visit: Payer: Self-pay

## 2019-12-23 DIAGNOSIS — M779 Enthesopathy, unspecified: Secondary | ICD-10-CM

## 2019-12-23 DIAGNOSIS — B351 Tinea unguium: Secondary | ICD-10-CM

## 2019-12-23 DIAGNOSIS — M216X9 Other acquired deformities of unspecified foot: Secondary | ICD-10-CM

## 2019-12-23 DIAGNOSIS — E1142 Type 2 diabetes mellitus with diabetic polyneuropathy: Secondary | ICD-10-CM

## 2019-12-23 DIAGNOSIS — M21962 Unspecified acquired deformity of left lower leg: Secondary | ICD-10-CM

## 2019-12-23 NOTE — Progress Notes (Signed)
Patient not eligibel for another brace for another 18 months.  Going to add additonal padding to one he has and about 4* valgus posting RF.

## 2019-12-28 DIAGNOSIS — G4733 Obstructive sleep apnea (adult) (pediatric): Secondary | ICD-10-CM | POA: Diagnosis not present

## 2019-12-30 ENCOUNTER — Telehealth: Payer: Self-pay | Admitting: Sports Medicine

## 2019-12-30 DIAGNOSIS — G2581 Restless legs syndrome: Secondary | ICD-10-CM | POA: Diagnosis not present

## 2019-12-30 DIAGNOSIS — M109 Gout, unspecified: Secondary | ICD-10-CM | POA: Diagnosis not present

## 2019-12-30 DIAGNOSIS — Z792 Long term (current) use of antibiotics: Secondary | ICD-10-CM | POA: Diagnosis not present

## 2019-12-30 DIAGNOSIS — E876 Hypokalemia: Secondary | ICD-10-CM | POA: Diagnosis not present

## 2019-12-30 DIAGNOSIS — Z7984 Long term (current) use of oral hypoglycemic drugs: Secondary | ICD-10-CM | POA: Diagnosis not present

## 2019-12-30 DIAGNOSIS — K219 Gastro-esophageal reflux disease without esophagitis: Secondary | ICD-10-CM | POA: Diagnosis not present

## 2019-12-30 DIAGNOSIS — Z9181 History of falling: Secondary | ICD-10-CM | POA: Diagnosis not present

## 2019-12-30 DIAGNOSIS — Z9981 Dependence on supplemental oxygen: Secondary | ICD-10-CM | POA: Diagnosis not present

## 2019-12-30 DIAGNOSIS — E871 Hypo-osmolality and hyponatremia: Secondary | ICD-10-CM | POA: Diagnosis not present

## 2019-12-30 DIAGNOSIS — E785 Hyperlipidemia, unspecified: Secondary | ICD-10-CM | POA: Diagnosis not present

## 2019-12-30 DIAGNOSIS — J1282 Pneumonia due to coronavirus disease 2019: Secondary | ICD-10-CM | POA: Diagnosis not present

## 2019-12-30 DIAGNOSIS — Z856 Personal history of leukemia: Secondary | ICD-10-CM | POA: Diagnosis not present

## 2019-12-30 DIAGNOSIS — M1712 Unilateral primary osteoarthritis, left knee: Secondary | ICD-10-CM | POA: Diagnosis not present

## 2019-12-30 DIAGNOSIS — U071 COVID-19: Secondary | ICD-10-CM | POA: Diagnosis not present

## 2019-12-30 DIAGNOSIS — N179 Acute kidney failure, unspecified: Secondary | ICD-10-CM | POA: Diagnosis not present

## 2019-12-30 DIAGNOSIS — E119 Type 2 diabetes mellitus without complications: Secondary | ICD-10-CM | POA: Diagnosis not present

## 2019-12-30 DIAGNOSIS — J9601 Acute respiratory failure with hypoxia: Secondary | ICD-10-CM | POA: Diagnosis not present

## 2019-12-30 DIAGNOSIS — I119 Hypertensive heart disease without heart failure: Secondary | ICD-10-CM | POA: Diagnosis not present

## 2019-12-30 DIAGNOSIS — J440 Chronic obstructive pulmonary disease with acute lower respiratory infection: Secondary | ICD-10-CM | POA: Diagnosis not present

## 2019-12-30 DIAGNOSIS — Z7952 Long term (current) use of systemic steroids: Secondary | ICD-10-CM | POA: Diagnosis not present

## 2019-12-30 DIAGNOSIS — Z96651 Presence of right artificial knee joint: Secondary | ICD-10-CM | POA: Diagnosis not present

## 2019-12-30 NOTE — Telephone Encounter (Signed)
I notified pt Per Liliane Channel he thought he had called that pt and apologizes. The brace needed more work than he expected and is able to do in our office so we have sent it back to be refurbished. It will take a few weeks and I will call pt when it comes in.

## 2019-12-30 NOTE — Telephone Encounter (Signed)
Pt called stating he seen Liliane Channel last week and he took his brace and was going to work on it and send it back with Dr Cannon Kettle for pt to pick up today.

## 2019-12-31 DIAGNOSIS — U071 COVID-19: Secondary | ICD-10-CM | POA: Diagnosis not present

## 2020-01-07 DIAGNOSIS — Z8616 Personal history of COVID-19: Secondary | ICD-10-CM | POA: Diagnosis not present

## 2020-01-07 DIAGNOSIS — I10 Essential (primary) hypertension: Secondary | ICD-10-CM | POA: Diagnosis not present

## 2020-01-07 DIAGNOSIS — E663 Overweight: Secondary | ICD-10-CM | POA: Diagnosis not present

## 2020-01-07 DIAGNOSIS — E782 Mixed hyperlipidemia: Secondary | ICD-10-CM | POA: Diagnosis not present

## 2020-01-07 DIAGNOSIS — E1142 Type 2 diabetes mellitus with diabetic polyneuropathy: Secondary | ICD-10-CM | POA: Diagnosis not present

## 2020-01-14 DIAGNOSIS — E1142 Type 2 diabetes mellitus with diabetic polyneuropathy: Secondary | ICD-10-CM | POA: Diagnosis not present

## 2020-01-14 DIAGNOSIS — M2042 Other hammer toe(s) (acquired), left foot: Secondary | ICD-10-CM | POA: Diagnosis not present

## 2020-01-14 DIAGNOSIS — M2041 Other hammer toe(s) (acquired), right foot: Secondary | ICD-10-CM | POA: Diagnosis not present

## 2020-01-14 DIAGNOSIS — M19079 Primary osteoarthritis, unspecified ankle and foot: Secondary | ICD-10-CM | POA: Diagnosis not present

## 2020-01-14 DIAGNOSIS — L89890 Pressure ulcer of other site, unstageable: Secondary | ICD-10-CM | POA: Insufficient documentation

## 2020-01-29 ENCOUNTER — Encounter: Payer: Self-pay | Admitting: Sports Medicine

## 2020-01-29 ENCOUNTER — Ambulatory Visit: Payer: PPO | Admitting: Sports Medicine

## 2020-01-29 ENCOUNTER — Other Ambulatory Visit: Payer: Self-pay

## 2020-01-29 DIAGNOSIS — M779 Enthesopathy, unspecified: Secondary | ICD-10-CM

## 2020-01-29 DIAGNOSIS — E1142 Type 2 diabetes mellitus with diabetic polyneuropathy: Secondary | ICD-10-CM | POA: Diagnosis not present

## 2020-01-29 DIAGNOSIS — B351 Tinea unguium: Secondary | ICD-10-CM

## 2020-01-29 DIAGNOSIS — M79676 Pain in unspecified toe(s): Secondary | ICD-10-CM | POA: Diagnosis not present

## 2020-01-29 DIAGNOSIS — M21962 Unspecified acquired deformity of left lower leg: Secondary | ICD-10-CM

## 2020-01-29 DIAGNOSIS — M79609 Pain in unspecified limb: Secondary | ICD-10-CM

## 2020-01-29 DIAGNOSIS — M216X9 Other acquired deformities of unspecified foot: Secondary | ICD-10-CM

## 2020-01-29 NOTE — Progress Notes (Signed)
Subjective: Samuel Hodge is a 69 y.o. male patient with history of diabetes who presents to office today complaining of long,mildly painful nails  while ambulating in shoes; unable to trim. Patient states that the glucose reading this morning was not recorded but yesterday was 136.  A1C 8.5. Reports some rubbing and increased pain to the plantar lateral foot.  Reports 2 weeks ago his PCP looked at the area and thought it looked okay but patient is still complaining of increased pain to the area.  Patient denies any other pedal complaints at this time.  Patient Active Problem List   Diagnosis Date Noted  . Vitamin B12 deficiency 12/13/2016  . Chronic gout of multiple sites 04/04/2016  . Chronic gouty arthritis 04/04/2016  . Gastroesophageal reflux disease without esophagitis 04/04/2016  . Mixed hyperlipidemia 04/04/2016  . Multiple-type hyperlipidemia 04/04/2016  . Benign neoplasm of colon 04/03/2016  . Cardiac arrhythmia 04/03/2016  . Cardiac enlargement 04/03/2016  . Cardiomegaly 04/03/2016  . Diabetes, polyneuropathy ([place]) 04/03/2016  . Diabetic polyneuropathy ([place]) 04/03/2016  . Essential hypertension 04/03/2016  . Generalized OA 04/03/2016  . Hearing loss 04/03/2016  . High risk medication use 04/03/2016  . Hydrocele of testis 04/03/2016  . Hypogonadism in male 04/03/2016  . Obesity (BMI 30-39.9) 04/03/2016  . Peripheral neuropathy 04/03/2016  . Primary osteoarthritis involving multiple joints 04/03/2016  . RLS (restless legs syndrome) 04/03/2016  . Thoracic radiculopathy 04/03/2016  . Thoracic root lesion 04/03/2016  . Primary osteoarthritis of right knee 10/10/2015  . Restless legs syndrome 12/14/2013  . Dyspnea and respiratory abnormality 02/16/2013   Current Outpatient Medications on File Prior to Visit  Medication Sig Dispense Refill  . amLODipine (NORVASC) 5 MG tablet Take 5 mg by mouth daily.    Marland Kitchen apixaban (ELIQUIS) 2.5 MG TABS tablet Take 1 tablet (2.5 mg total) by  mouth every 12 (twelve) hours. 60 tablet 0  . azithromycin (ZITHROMAX) 250 MG tablet     . CLOBETASOL PROPIONATE E 0.05 % emollient cream     . cyclobenzaprine (FLEXERIL) 10 MG tablet Take 10 mg by mouth.    . docusate sodium (COLACE) 100 MG capsule Take 1 capsule (100 mg total) by mouth 2 (two) times daily. (Patient taking differently: Take 100 mg by mouth 2 (two) times daily as needed for mild constipation. ) 60 capsule 3  . doxycycline (VIBRA-TABS) 100 MG tablet Take 1 tablet (100 mg total) by mouth 2 (two) times daily. 20 tablet 0  . empagliflozin (JARDIANCE) 25 MG TABS tablet Jardiance 25 mg tablet    . gabapentin (NEURONTIN) 600 MG tablet Take 1,200 mg by mouth 2 (two) times daily.     . Gabapentin Enacarbil 600 MG TBCR Take 1 pill with evening meal    . glimepiride (AMARYL) 1 MG tablet Take 1 mg by mouth daily with breakfast.    . glucose blood (FREESTYLE LITE) test strip USE 1 STRIP EVERY DAY    . hydrochlorothiazide (HYDRODIURIL) 25 MG tablet Take 25 mg by mouth daily.    Marland Kitchen HYDROcodone-acetaminophen (NORCO) 10-325 MG tablet Take 1 tablet by mouth every 6 (six) hours as needed.    Marland Kitchen HYDROcodone-acetaminophen (NORCO) 5-325 MG tablet Take 1-2 tablets by mouth every 4 (four) hours as needed for moderate pain. 90 tablet 0  . Lancets (FREESTYLE) lancets CHECK BLOOD SUGAR EVERY DAY AS DIRECTED    . latanoprost (XALATAN) 0.005 % ophthalmic solution     . latanoprost (XALATAN) 0.005 % ophthalmic solution Place 0.005 drops into  both eyes daily.    Marland Kitchen lisinopril (PRINIVIL,ZESTRIL) 20 MG tablet Take 30 mg by mouth daily.    . metFORMIN (GLUCOPHAGE-XR) 500 MG 24 hr tablet Take 1,000 mg by mouth 2 (two) times daily.    . ondansetron (ZOFRAN) 4 MG tablet Take 1 tablet (4 mg total) by mouth every 6 (six) hours as needed for nausea. 20 tablet 0  . oxyCODONE (OXYCONTIN) 10 mg 12 hr tablet Take 1 tablet (10 mg total) by mouth PRO. 1 tab PO every 12 hours for 3 days, then 1 tab PO daily for 4 days 10 tablet  0  . pantoprazole (PROTONIX) 40 MG tablet Take 40 mg by mouth daily.    . promethazine (PHENERGAN) 25 MG tablet Take 25 mg by mouth every 6 (six) hours as needed for nausea or vomiting.    Marland Kitchen rOPINIRole (REQUIP XL) 8 MG 24 hr tablet Take 8 mg by mouth at bedtime.    . rosuvastatin (CRESTOR) 10 MG tablet Take 10 mg by mouth.    . senna (SENOKOT) 8.6 MG TABS tablet Take 2 tablets (17.2 mg total) by mouth at bedtime. 60 each 3  . terbinafine (LAMISIL) 250 MG tablet      Current Facility-Administered Medications on File Prior to Visit  Medication Dose Route Frequency Provider Last Rate Last Admin  . triamcinolone acetonide (KENALOG) 10 MG/ML injection 10 mg  10 mg Other Once [place], Kammie Scioli, DPM      . triamcinolone acetonide (KENALOG-40) injection 20 mg  20 mg Other Once Landis Martins, DPM       Allergies  Allergen Reactions  . Aleve [Naproxen Sodium] Hives  . Aspirin Other (See Comments)    Unsure Unknown "childhood allergy"  . Penicillins Other (See Comments)    Unsure All pt remembers is nose bleeds associated with this reaction.  Has patient had a PCN reaction causing immediate rash, facial/tongue/throat swelling, SOB or lightheadedness with hypotension: Unknown Has patient had a PCN reaction causing severe rash involving mucus membranes or skin necrosis: Unknown Has patient had a PCN reaction that required hospitalization: Yes Has patient had a PCN reaction occurring within the last 10 years: No If all of the above answers are "NO", then may proceed with Cepha  . Latex Rash    No results found for this or any previous visit (from the past 2160 hour(s)).  Objective: General: Patient is awake, alert, and oriented x 3 and in no acute distress.  Integument: Skin is warm, dry and supple bilateral. Nails are tender, long, thickened and  dystrophic with subungual debris, consistent with onychomycosis, 1-5 bilateral.  There is fragments of nails noted bilateral hallux.  No signs of  infection.  Preulcerative callus plantar left foot.  Remaining integument unremarkable.  Vasculature:  Dorsalis Pedis pulse 1/4 bilateral. Posterior Tibial pulse  1/4 bilateral.  Capillary fill time <3 sec 1-5 bilateral. Positive hair growth to the level of the digits. Temperature gradient within normal limits. No varicosities present bilateral. No edema present bilateral.   Neurology: The patient has intact sensation measured with a 5.07/10g Semmes Weinstein Monofilament at all pedal sites bilateral . Vibratory sensation diminished bilateral with tuning fork. No Babinski sign present bilateral.   Musculoskeletal: + Pain at 5th met base on left. Severe ankle deformity control for now with AFO patient awaiting orthopedic follow-up for total ankle replacement versus fusion on the left.  Assessment and Plan: Problem List Items Addressed This Visit      Endocrine   Diabetic  polyneuropathy ([place])    Other Visit Diagnoses    Pain due to onychomycosis of nail    -  Primary   Cavus deformity of foot, acquired       Ankle deformity, left       Capsulitis         -Examined patient. -Re-Discussed and educated patient on diabetic foot care, especially with  regards to the vascular, neurological and musculoskeletal systems.  -Stressed the importance of good glycemic control and the detriment of not  controlling glucose levels in relation to the foot. -Mechanically debrided all nails 1-5 bilateral using sterile nail nipper and filed with dremel without incident 4 -Applied Mepilex border padding to the area and advised patient to do the same and may use Betadine like I did today to the area as well to keep it clean and dry and free of any infection at the plantar lateral aspect of the left foot I also explained to patient that likely because he has been without his brace and getting increased lateral pressure this is why it probably rubbed a sore spot and has been a little painful at the plantar lateral  aspect of the left foot as well -Refurbished Brace dispensed today with proper fit noted and I advised patient to slowly try the brace over the next week and watch for any increased pain and symptoms if there is any to return to office sooner -Answered all patient questions -Patient to return  in 3 months for at risk foot care or sooner if problems or issues arise -Patient advised to call the office if any problems or questions arise in the meantime.  Landis Martins, DPM

## 2020-02-01 ENCOUNTER — Telehealth: Payer: Self-pay | Admitting: Sports Medicine

## 2020-02-01 NOTE — Telephone Encounter (Signed)
Called pt and told him shoes should be shipping anytime and I will call him when they come in.Samuel Hodge

## 2020-03-04 ENCOUNTER — Encounter: Payer: Self-pay | Admitting: Sports Medicine

## 2020-03-04 ENCOUNTER — Other Ambulatory Visit: Payer: Self-pay

## 2020-03-04 ENCOUNTER — Ambulatory Visit: Payer: PPO | Admitting: Sports Medicine

## 2020-03-04 DIAGNOSIS — E1142 Type 2 diabetes mellitus with diabetic polyneuropathy: Secondary | ICD-10-CM | POA: Diagnosis not present

## 2020-03-04 DIAGNOSIS — L02619 Cutaneous abscess of unspecified foot: Secondary | ICD-10-CM | POA: Diagnosis not present

## 2020-03-04 DIAGNOSIS — L84 Corns and callosities: Secondary | ICD-10-CM

## 2020-03-04 DIAGNOSIS — M79672 Pain in left foot: Secondary | ICD-10-CM

## 2020-03-04 DIAGNOSIS — L03119 Cellulitis of unspecified part of limb: Secondary | ICD-10-CM

## 2020-03-04 DIAGNOSIS — M216X9 Other acquired deformities of unspecified foot: Secondary | ICD-10-CM | POA: Diagnosis not present

## 2020-03-04 MED ORDER — CLINDAMYCIN HCL 300 MG PO CAPS: 300.0000 mg | ORAL_CAPSULE | Freq: Three times a day (TID) | ORAL | 0 refills | Status: DC

## 2020-03-04 MED ORDER — HYDROCODONE-ACETAMINOPHEN 10-325 MG PO TABS: 1.0000 | ORAL_TABLET | Freq: Four times a day (QID) | ORAL | 0 refills | Status: DC | PRN

## 2020-03-04 NOTE — Progress Notes (Signed)
Subjective: Samuel Hodge is a 69 y.o. male patient seen in office for evaluation of painful callus of the left foot.  Patient has a history of diabetes and a blood glucose level  today of 126 mg/dl, last A1c 8.6 last visit to PCP 1 month ago.   Patient reports over the last week it has gotten more red and swollen with increasing pain and tender to the lateral side of the left foot reports that he has been trying to wear the brace and added some additional padding to try to help the rubbing stop but it still has continued and the foot is very sore and painful has been applying Betadine to the plantar callus area, denies nausea/fever/vomiting/chills/night sweats/shortness of breath.  Patient has no other pedal complaints at this time.  Patient Active Problem List   Diagnosis Date Noted  . Vitamin B12 deficiency 12/13/2016  . Chronic gout of multiple sites 04/04/2016  . Chronic gouty arthritis 04/04/2016  . Gastroesophageal reflux disease without esophagitis 04/04/2016  . Mixed hyperlipidemia 04/04/2016  . Multiple-type hyperlipidemia 04/04/2016  . Benign neoplasm of colon 04/03/2016  . Cardiac arrhythmia 04/03/2016  . Cardiac enlargement 04/03/2016  . Cardiomegaly 04/03/2016  . Diabetes, polyneuropathy ([place]) 04/03/2016  . Diabetic polyneuropathy ([place]) 04/03/2016  . Essential hypertension 04/03/2016  . Generalized OA 04/03/2016  . Hearing loss 04/03/2016  . High risk medication use 04/03/2016  . Hydrocele of testis 04/03/2016  . Hypogonadism in male 04/03/2016  . Obesity (BMI 30-39.9) 04/03/2016  . Peripheral neuropathy 04/03/2016  . Primary osteoarthritis involving multiple joints 04/03/2016  . RLS (restless legs syndrome) 04/03/2016  . Thoracic radiculopathy 04/03/2016  . Thoracic root lesion 04/03/2016  . Primary osteoarthritis of right knee 10/10/2015  . Restless legs syndrome 12/14/2013  . Dyspnea and respiratory abnormality 02/16/2013   Current Outpatient Medications on File  Prior to Visit  Medication Sig Dispense Refill  . amLODipine (NORVASC) 5 MG tablet Take 5 mg by mouth daily.    Marland Kitchen apixaban (ELIQUIS) 2.5 MG TABS tablet Take 1 tablet (2.5 mg total) by mouth every 12 (twelve) hours. 60 tablet 0  . azithromycin (ZITHROMAX) 250 MG tablet     . CLOBETASOL PROPIONATE E 0.05 % emollient cream     . cyclobenzaprine (FLEXERIL) 10 MG tablet Take 10 mg by mouth.    . docusate sodium (COLACE) 100 MG capsule Take 1 capsule (100 mg total) by mouth 2 (two) times daily. (Patient taking differently: Take 100 mg by mouth 2 (two) times daily as needed for mild constipation. ) 60 capsule 3  . doxycycline (VIBRA-TABS) 100 MG tablet Take 1 tablet (100 mg total) by mouth 2 (two) times daily. 20 tablet 0  . empagliflozin (JARDIANCE) 25 MG TABS tablet Jardiance 25 mg tablet    . gabapentin (NEURONTIN) 600 MG tablet Take 1,200 mg by mouth 2 (two) times daily.     . Gabapentin Enacarbil 600 MG TBCR Take 1 pill with evening meal    . glimepiride (AMARYL) 1 MG tablet Take 1 mg by mouth daily with breakfast.    . glucose blood (FREESTYLE LITE) test strip USE 1 STRIP EVERY DAY    . hydrochlorothiazide (HYDRODIURIL) 25 MG tablet Take 25 mg by mouth daily.    . Lancets (FREESTYLE) lancets CHECK BLOOD SUGAR EVERY DAY AS DIRECTED    . latanoprost (XALATAN) 0.005 % ophthalmic solution     . latanoprost (XALATAN) 0.005 % ophthalmic solution Place 0.005 drops into both eyes daily.    Marland Kitchen  lisinopril (PRINIVIL,ZESTRIL) 20 MG tablet Take 30 mg by mouth daily.    . metFORMIN (GLUCOPHAGE-XR) 500 MG 24 hr tablet Take 1,000 mg by mouth 2 (two) times daily.    . ondansetron (ZOFRAN) 4 MG tablet Take 1 tablet (4 mg total) by mouth every 6 (six) hours as needed for nausea. 20 tablet 0  . oxyCODONE (OXYCONTIN) 10 mg 12 hr tablet Take 1 tablet (10 mg total) by mouth PRO. 1 tab PO every 12 hours for 3 days, then 1 tab PO daily for 4 days 10 tablet 0  . pantoprazole (PROTONIX) 40 MG tablet Take 40 mg by mouth  daily.    . promethazine (PHENERGAN) 25 MG tablet Take 25 mg by mouth every 6 (six) hours as needed for nausea or vomiting.    Marland Kitchen rOPINIRole (REQUIP XL) 8 MG 24 hr tablet Take 8 mg by mouth at bedtime.    . rosuvastatin (CRESTOR) 10 MG tablet Take 10 mg by mouth.    . senna (SENOKOT) 8.6 MG TABS tablet Take 2 tablets (17.2 mg total) by mouth at bedtime. 60 each 3  . terbinafine (LAMISIL) 250 MG tablet      Current Facility-Administered Medications on File Prior to Visit  Medication Dose Route Frequency Provider Last Rate Last Admin  . triamcinolone acetonide (KENALOG) 10 MG/ML injection 10 mg  10 mg Other Once [place], Maddalynn Barnard, DPM      . triamcinolone acetonide (KENALOG-40) injection 20 mg  20 mg Other Once Landis Martins, DPM       Allergies  Allergen Reactions  . Aleve [Naproxen Sodium] Hives  . Aspirin Other (See Comments)    Unsure Unknown "childhood allergy"  . Penicillins Other (See Comments)    Unsure All pt remembers is nose bleeds associated with this reaction.  Has patient had a PCN reaction causing immediate rash, facial/tongue/throat swelling, SOB or lightheadedness with hypotension: Unknown Has patient had a PCN reaction causing severe rash involving mucus membranes or skin necrosis: Unknown Has patient had a PCN reaction that required hospitalization: Yes Has patient had a PCN reaction occurring within the last 10 years: No If all of the above answers are "NO", then may proceed with Cepha  . Latex Rash    No results found for this or any previous visit (from the past 2160 hour(s)).  Objective: There were no vitals filed for this visit.  General: Patient is awake, alert, oriented x 3 and in no acute distress.  Dermatology: Skin is warm and dry bilateral with a preulcerative callus noted to the plantar lateral left foot upon debridement of the callus there was a small pinpoint opening measuring less than 0.1 cm with a granular base, there is no malodor, no active  drainage, however there is significant erythema and cellulitis to the level of the midfoot and mild warmth.   Vasculature:  Dorsalis Pedis pulse 1/4 bilateral. Posterior Tibial pulse  1/4 bilateral.  Capillary fill time <3 sec 1-5 bilateral. Positive hair growth to the level of the digits. Temperature gradient within normal limits except at dorsal lateral left foot. No varicosities present bilateral. No edema present bilateral.    Neurology: The patient has intact sensation measured with a 5.07/10g Semmes Weinstein Monofilament at all pedal sites bilateral . Vibratory sensation diminished bilateral with tuning fork. No Babinski sign present bilateral.    Musculoskeletal: + Pain at 5th met and dorsal lateral left foot. Severe ankle deformity control for now with AFO patient awaiting orthopedic follow-up for total ankle  replacement versus fusion on the left  No results for input(s): GRAMSTAIN, LABORGA in the last 8760 hours.  Assessment and Plan:  Problem List Items Addressed This Visit      Endocrine   Diabetic polyneuropathy ([place])    Other Visit Diagnoses    Cellulitis and abscess of foot, except toes    -  Primary   Pre-ulcerative calluses       Cavus deformity of foot, acquired       Left foot pain           -Examined patient and discussed the progression of the wound and treatment alternatives. - Excisionally dedbrided ulceration at plantar lateral left foot to healthy bleeding borders removing nonviable tissue using a sterile chisel blade. Wound measures post debridement as above.  Wound was debrided to the level of the dermis with viable wound base exposed to promote healing. Hemostasis was achieved with manuel pressure. Patient tolerated procedure well without any discomfort or anesthesia necessary for this wound debridement.  -Applied Betadine and dry sterile dressing and instructed patient to continue with daily dressings at home consisting of same -Prescribed clindamycin for  patient to take for cellulitis and advised patient if area worsens to return to office or go to ER area of cellulitis was marked for patient to closely monitor -Advised patient to refrain from wearing brace and tennis shoe at this time until symptoms improve and to return to using cam boot or surgical shoe which she has at home -Prescribed Norco for patient to take for pain related to cellulitis infection -Patient to return to office in 1 week for follow up care and evaluation or sooner if problems arise.  Advised patient if area is still red we will get x-rays at next visit.  Landis Martins, DPM

## 2020-03-10 DIAGNOSIS — G4733 Obstructive sleep apnea (adult) (pediatric): Secondary | ICD-10-CM | POA: Diagnosis not present

## 2020-03-11 ENCOUNTER — Other Ambulatory Visit: Payer: Self-pay

## 2020-03-11 ENCOUNTER — Encounter: Payer: Self-pay | Admitting: Sports Medicine

## 2020-03-11 ENCOUNTER — Ambulatory Visit: Payer: PPO | Admitting: Sports Medicine

## 2020-03-11 DIAGNOSIS — E1142 Type 2 diabetes mellitus with diabetic polyneuropathy: Secondary | ICD-10-CM | POA: Diagnosis not present

## 2020-03-11 DIAGNOSIS — L03119 Cellulitis of unspecified part of limb: Secondary | ICD-10-CM

## 2020-03-11 DIAGNOSIS — L84 Corns and callosities: Secondary | ICD-10-CM | POA: Diagnosis not present

## 2020-03-11 DIAGNOSIS — M216X9 Other acquired deformities of unspecified foot: Secondary | ICD-10-CM | POA: Diagnosis not present

## 2020-03-11 DIAGNOSIS — L02619 Cutaneous abscess of unspecified foot: Secondary | ICD-10-CM | POA: Diagnosis not present

## 2020-03-11 DIAGNOSIS — M79672 Pain in left foot: Secondary | ICD-10-CM

## 2020-03-11 NOTE — Progress Notes (Signed)
Subjective: Samuel Hodge is a 69 y.o. male patient seen in office for follow-up evaluation of cellulitis to the left foot.  Patient reports that the cellulitis has improved and is getting better reports now he can put a little bit of pressure on the foot and it does not hurt as bad.  Patient reports that he did go 1 day without wearing the pad to the lateral aspect and noticed that it rubbed again and had a small opening.  Patient reports that he picked up his antibiotics and has about 3 more days of this medication left and also pick up the pain medication but only took about 2-3 doses because he felt nauseous so discontinued it.  Denies fever/chills/night sweats/shortness of breath.  Patient has no other pedal complaints at this time.  Patient Active Problem List   Diagnosis Date Noted  . Vitamin B12 deficiency 12/13/2016  . Chronic gout of multiple sites 04/04/2016  . Chronic gouty arthritis 04/04/2016  . Gastroesophageal reflux disease without esophagitis 04/04/2016  . Mixed hyperlipidemia 04/04/2016  . Multiple-type hyperlipidemia 04/04/2016  . Benign neoplasm of colon 04/03/2016  . Cardiac arrhythmia 04/03/2016  . Cardiac enlargement 04/03/2016  . Cardiomegaly 04/03/2016  . Diabetes, polyneuropathy ([place]) 04/03/2016  . Diabetic polyneuropathy ([place]) 04/03/2016  . Essential hypertension 04/03/2016  . Generalized OA 04/03/2016  . Hearing loss 04/03/2016  . High risk medication use 04/03/2016  . Hydrocele of testis 04/03/2016  . Hypogonadism in male 04/03/2016  . Obesity (BMI 30-39.9) 04/03/2016  . Peripheral neuropathy 04/03/2016  . Primary osteoarthritis involving multiple joints 04/03/2016  . RLS (restless legs syndrome) 04/03/2016  . Thoracic radiculopathy 04/03/2016  . Thoracic root lesion 04/03/2016  . Primary osteoarthritis of right knee 10/10/2015  . Restless legs syndrome 12/14/2013  . Dyspnea and respiratory abnormality 02/16/2013   Current Outpatient Medications on  File Prior to Visit  Medication Sig Dispense Refill  . amLODipine (NORVASC) 5 MG tablet Take 5 mg by mouth daily.    Marland Kitchen apixaban (ELIQUIS) 2.5 MG TABS tablet Take 1 tablet (2.5 mg total) by mouth every 12 (twelve) hours. 60 tablet 0  . azithromycin (ZITHROMAX) 250 MG tablet     . clindamycin (CLEOCIN) 300 MG capsule Take 1 capsule (300 mg total) by mouth 3 (three) times daily. 30 capsule 0  . CLOBETASOL PROPIONATE E 0.05 % emollient cream     . cyclobenzaprine (FLEXERIL) 10 MG tablet Take 10 mg by mouth.    . docusate sodium (COLACE) 100 MG capsule Take 1 capsule (100 mg total) by mouth 2 (two) times daily. (Patient taking differently: Take 100 mg by mouth 2 (two) times daily as needed for mild constipation. ) 60 capsule 3  . doxycycline (VIBRA-TABS) 100 MG tablet Take 1 tablet (100 mg total) by mouth 2 (two) times daily. 20 tablet 0  . empagliflozin (JARDIANCE) 25 MG TABS tablet Jardiance 25 mg tablet    . gabapentin (NEURONTIN) 600 MG tablet Take 1,200 mg by mouth 2 (two) times daily.     . Gabapentin Enacarbil 600 MG TBCR Take 1 pill with evening meal    . glimepiride (AMARYL) 1 MG tablet Take 1 mg by mouth daily with breakfast.    . glucose blood (FREESTYLE LITE) test strip USE 1 STRIP EVERY DAY    . hydrochlorothiazide (HYDRODIURIL) 25 MG tablet Take 25 mg by mouth daily.    Marland Kitchen HYDROcodone-acetaminophen (NORCO) 10-325 MG tablet Take 1 tablet by mouth every 6 (six) hours as needed. 30 tablet  0  . Lancets (FREESTYLE) lancets CHECK BLOOD SUGAR EVERY DAY AS DIRECTED    . latanoprost (XALATAN) 0.005 % ophthalmic solution     . latanoprost (XALATAN) 0.005 % ophthalmic solution Place 0.005 drops into both eyes daily.    Marland Kitchen lisinopril (PRINIVIL,ZESTRIL) 20 MG tablet Take 30 mg by mouth daily.    . metFORMIN (GLUCOPHAGE-XR) 500 MG 24 hr tablet Take 1,000 mg by mouth 2 (two) times daily.    . ondansetron (ZOFRAN) 4 MG tablet Take 1 tablet (4 mg total) by mouth every 6 (six) hours as needed for nausea.  20 tablet 0  . oxyCODONE (OXYCONTIN) 10 mg 12 hr tablet Take 1 tablet (10 mg total) by mouth PRO. 1 tab PO every 12 hours for 3 days, then 1 tab PO daily for 4 days 10 tablet 0  . pantoprazole (PROTONIX) 40 MG tablet Take 40 mg by mouth daily.    . promethazine (PHENERGAN) 25 MG tablet Take 25 mg by mouth every 6 (six) hours as needed for nausea or vomiting.    Marland Kitchen rOPINIRole (REQUIP XL) 8 MG 24 hr tablet Take 8 mg by mouth at bedtime.    . rosuvastatin (CRESTOR) 10 MG tablet Take 10 mg by mouth.    . senna (SENOKOT) 8.6 MG TABS tablet Take 2 tablets (17.2 mg total) by mouth at bedtime. 60 each 3  . terbinafine (LAMISIL) 250 MG tablet      Current Facility-Administered Medications on File Prior to Visit  Medication Dose Route Frequency Provider Last Rate Last Admin  . triamcinolone acetonide (KENALOG) 10 MG/ML injection 10 mg  10 mg Other Once [place], Corinn Stoltzfus, DPM      . triamcinolone acetonide (KENALOG-40) injection 20 mg  20 mg Other Once Landis Martins, DPM       Allergies  Allergen Reactions  . Aleve [Naproxen Sodium] Hives  . Aspirin Other (See Comments)    Unsure Unknown "childhood allergy"  . Penicillins Other (See Comments)    Unsure All pt remembers is nose bleeds associated with this reaction.  Has patient had a PCN reaction causing immediate rash, facial/tongue/throat swelling, SOB or lightheadedness with hypotension: Unknown Has patient had a PCN reaction causing severe rash involving mucus membranes or skin necrosis: Unknown Has patient had a PCN reaction that required hospitalization: Yes Has patient had a PCN reaction occurring within the last 10 years: No If all of the above answers are "NO", then may proceed with Cepha  . Latex Rash    No results found for this or any previous visit (from the past 2160 hour(s)).  Objective: There were no vitals filed for this visit.  General: Patient is awake, alert, oriented x 3 and in no acute distress.  Dermatology: Skin is  warm and dry bilateral with a preulcerative callus noted to the plantar lateral left foot with a pinpoint opening measuring less than 0.1 cm with a granular base, there is no malodor, no active drainage, no calf   Vasc digit normalulature:  Dorsalis Pedis pulse 1/4 bilateral. Posterior Tibial pulse  1/4 bilateral. Capillary fill time <3 sec 1-5 bilateral. Positive hair growth to the level of the digits.Temperature gradient within normal limits dorsal lateral left foot. No varicosities present bilateral. No edema present bilateral.    Neurology: The patient has intact sensation measured with a 5.07/10g Semmes Weinstein Monofilament at all pedal sites bilateral . Vibratory sensation diminished bilateral with tuning fork. No Babinski sign present bilateral.    Musculoskeletal: +Decreased Pain at  5th met and dorsal lateral left foot. Severe ankle deformity control for now with AFO patient awaiting orthopedic follow-up for total ankle replacement versus fusion on the left  No results for input(s): GRAMSTAIN, LABORGA in the last 8760 hours.  Assessment and Plan:  Problem List Items Addressed This Visit      Endocrine   Diabetic polyneuropathy ([place])    Other Visit Diagnoses    Cellulitis and abscess of foot, except toes    -  Primary   Pre-ulcerative calluses       Cavus deformity of foot, acquired       Left foot pain           -Examined patient and discussed the progression of the wound and treatment alternatives. - No debridement necessary this visit  -Applied antibiotic cream and dry sterile dressing and instructed patient to continue with daily dressings at home consisting of same for the next 2 days -Continue with clindamycin -Advised patient to refrain from wearing brace and tennis shoe at this time until symptoms improve; patient to follow up with Liliane Channel re brace and shoes -Continue with CAM boot/surgical shoe -Patient to return to office in 2 weeks for follow up care and evaluation or  sooner if problems arise.   Landis Martins, DPM

## 2020-03-21 ENCOUNTER — Encounter: Payer: Self-pay | Admitting: Sports Medicine

## 2020-03-21 ENCOUNTER — Telehealth: Payer: Self-pay

## 2020-03-23 ENCOUNTER — Other Ambulatory Visit: Payer: Self-pay

## 2020-03-23 ENCOUNTER — Ambulatory Visit: Payer: PPO | Admitting: Sports Medicine

## 2020-03-23 ENCOUNTER — Encounter: Payer: Self-pay | Admitting: Sports Medicine

## 2020-03-23 DIAGNOSIS — M216X9 Other acquired deformities of unspecified foot: Secondary | ICD-10-CM

## 2020-03-23 DIAGNOSIS — M779 Enthesopathy, unspecified: Secondary | ICD-10-CM

## 2020-03-23 DIAGNOSIS — L02619 Cutaneous abscess of unspecified foot: Secondary | ICD-10-CM

## 2020-03-23 DIAGNOSIS — E1142 Type 2 diabetes mellitus with diabetic polyneuropathy: Secondary | ICD-10-CM | POA: Diagnosis not present

## 2020-03-23 DIAGNOSIS — M1A09X0 Idiopathic chronic gout, multiple sites, without tophus (tophi): Secondary | ICD-10-CM | POA: Diagnosis not present

## 2020-03-23 DIAGNOSIS — L84 Corns and callosities: Secondary | ICD-10-CM | POA: Diagnosis not present

## 2020-03-23 DIAGNOSIS — L03119 Cellulitis of unspecified part of limb: Secondary | ICD-10-CM

## 2020-03-23 MED ORDER — COLCHICINE 0.6 MG PO TABS: 0.6000 mg | ORAL_TABLET | Freq: Every day | ORAL | 0 refills | Status: DC

## 2020-03-23 MED ORDER — SULFAMETHOXAZOLE-TRIMETHOPRIM 400-80 MG PO TABS: 1.0000 | ORAL_TABLET | Freq: Two times a day (BID) | ORAL | 0 refills | Status: DC

## 2020-03-23 NOTE — Progress Notes (Signed)
Subjective: Samuel Hodge is a 69 y.o. male patient seen in office for follow-up evaluation of cellulitis to the left foot.  Patient reports that the cellulitis has slowly returned since Friday with increased redness warmth and swelling reports that his toes feel tight can barely move them but does think the swelling has gone down a little.  Reports that there is no pain on the bottom of the foot like previous but there is some uncomfortable pain ranks pain 5 out of 10 has been using cam boot or postoperative shoe and taking Tylenol with some relief.  Patient denies nausea vomiting fever chills or any other symptoms at this time.  Patient Active Problem List   Diagnosis Date Noted  . Vitamin B12 deficiency 12/13/2016  . Chronic gout of multiple sites 04/04/2016  . Chronic gouty arthritis 04/04/2016  . Gastroesophageal reflux disease without esophagitis 04/04/2016  . Mixed hyperlipidemia 04/04/2016  . Multiple-type hyperlipidemia 04/04/2016  . Benign neoplasm of colon 04/03/2016  . Cardiac arrhythmia 04/03/2016  . Cardiac enlargement 04/03/2016  . Cardiomegaly 04/03/2016  . Diabetes, polyneuropathy ([place]) 04/03/2016  . Diabetic polyneuropathy ([place]) 04/03/2016  . Essential hypertension 04/03/2016  . Generalized OA 04/03/2016  . Hearing loss 04/03/2016  . High risk medication use 04/03/2016  . Hydrocele of testis 04/03/2016  . Hypogonadism in male 04/03/2016  . Obesity (BMI 30-39.9) 04/03/2016  . Peripheral neuropathy 04/03/2016  . Primary osteoarthritis involving multiple joints 04/03/2016  . RLS (restless legs syndrome) 04/03/2016  . Thoracic radiculopathy 04/03/2016  . Thoracic root lesion 04/03/2016  . Primary osteoarthritis of right knee 10/10/2015  . Restless legs syndrome 12/14/2013  . Dyspnea and respiratory abnormality 02/16/2013   Current Outpatient Medications on File Prior to Visit  Medication Sig Dispense Refill  . amLODipine (NORVASC) 5 MG tablet Take 5 mg by mouth  daily.    Marland Kitchen apixaban (ELIQUIS) 2.5 MG TABS tablet Take 1 tablet (2.5 mg total) by mouth every 12 (twelve) hours. 60 tablet 0  . azithromycin (ZITHROMAX) 250 MG tablet     . clindamycin (CLEOCIN) 300 MG capsule Take 1 capsule (300 mg total) by mouth 3 (three) times daily. 30 capsule 0  . CLOBETASOL PROPIONATE E 0.05 % emollient cream     . cyclobenzaprine (FLEXERIL) 10 MG tablet Take 10 mg by mouth.    . docusate sodium (COLACE) 100 MG capsule Take 1 capsule (100 mg total) by mouth 2 (two) times daily. (Patient taking differently: Take 100 mg by mouth 2 (two) times daily as needed for mild constipation. ) 60 capsule 3  . doxycycline (VIBRA-TABS) 100 MG tablet Take 1 tablet (100 mg total) by mouth 2 (two) times daily. 20 tablet 0  . empagliflozin (JARDIANCE) 25 MG TABS tablet Jardiance 25 mg tablet    . gabapentin (NEURONTIN) 600 MG tablet Take 1,200 mg by mouth 2 (two) times daily.     . Gabapentin Enacarbil 600 MG TBCR Take 1 pill with evening meal    . glimepiride (AMARYL) 1 MG tablet Take 1 mg by mouth daily with breakfast.    . glucose blood (FREESTYLE LITE) test strip USE 1 STRIP EVERY DAY    . hydrochlorothiazide (HYDRODIURIL) 25 MG tablet Take 25 mg by mouth daily.    Marland Kitchen HYDROcodone-acetaminophen (NORCO) 10-325 MG tablet Take 1 tablet by mouth every 6 (six) hours as needed. 30 tablet 0  . Lancets (FREESTYLE) lancets CHECK BLOOD SUGAR EVERY DAY AS DIRECTED    . latanoprost (XALATAN) 0.005 % ophthalmic solution     .  latanoprost (XALATAN) 0.005 % ophthalmic solution Place 0.005 drops into both eyes daily.    Marland Kitchen lisinopril (PRINIVIL,ZESTRIL) 20 MG tablet Take 30 mg by mouth daily.    . metFORMIN (GLUCOPHAGE-XR) 500 MG 24 hr tablet Take 1,000 mg by mouth 2 (two) times daily.    . ondansetron (ZOFRAN) 4 MG tablet Take 1 tablet (4 mg total) by mouth every 6 (six) hours as needed for nausea. 20 tablet 0  . oxyCODONE (OXYCONTIN) 10 mg 12 hr tablet Take 1 tablet (10 mg total) by mouth PRO. 1 tab PO  every 12 hours for 3 days, then 1 tab PO daily for 4 days 10 tablet 0  . pantoprazole (PROTONIX) 40 MG tablet Take 40 mg by mouth daily.    . promethazine (PHENERGAN) 25 MG tablet Take 25 mg by mouth every 6 (six) hours as needed for nausea or vomiting.    Marland Kitchen rOPINIRole (REQUIP XL) 8 MG 24 hr tablet Take 8 mg by mouth at bedtime.    . rosuvastatin (CRESTOR) 10 MG tablet Take 10 mg by mouth.    . senna (SENOKOT) 8.6 MG TABS tablet Take 2 tablets (17.2 mg total) by mouth at bedtime. 60 each 3  . terbinafine (LAMISIL) 250 MG tablet      Current Facility-Administered Medications on File Prior to Visit  Medication Dose Route Frequency Provider Last Rate Last Admin  . triamcinolone acetonide (KENALOG) 10 MG/ML injection 10 mg  10 mg Other Once [place] Theiss, DPM      . triamcinolone acetonide (KENALOG-40) injection 20 mg  20 mg Other Once Landis Martins, DPM       Allergies  Allergen Reactions  . Aleve [Naproxen Sodium] Hives  . Aspirin Other (See Comments)    Unsure Unknown "childhood allergy"  . Penicillins Other (See Comments)    Unsure All pt remembers is nose bleeds associated with this reaction.  Has patient had a PCN reaction causing immediate rash, facial/tongue/throat swelling, SOB or lightheadedness with hypotension: Unknown Has patient had a PCN reaction causing severe rash involving mucus membranes or skin necrosis: Unknown Has patient had a PCN reaction that required hospitalization: Yes Has patient had a PCN reaction occurring within the last 10 years: No If all of the above answers are "NO", then may proceed with Cepha  . Latex Rash    No results found for this or any previous visit (from the past 2160 hour(s)).  Objective: There were no vitals filed for this visit.  General: Patient is awake, alert, oriented x 3 and in no acute distress.  Dermatology: Skin is warm and dry bilateral with a preulcerative callus noted to the plantar lateral left foot with no underlying  opening once debrided, there is no active drainage, no malodor but there is significant cellulitis that extends across the entire foot to the level of the midfoot on the left.   Vasc digit normalulature:  Dorsalis Pedis pulse 1/4 bilateral. Posterior Tibial pulse  1/4 bilateral. Capillary fill time <3 sec 1-5 bilateral. Positive hair growth to the level of the digits.Temperature gradient within normal limits dorsal lateral left foot. No varicosities present bilateral.  Focal edema on the left at area of cellulitis.   Neurology: The patient has intact sensation measured with a 5.07/10g Semmes Weinstein Monofilament at all pedal sites bilateral . Vibratory sensation diminished bilateral with tuning fork. No Babinski sign present bilateral.    Musculoskeletal: +Decreased Pain at 5th met however there is pain to the dorsal midfoot on the  left. Severe ankle deformity control for now with AFO patient awaiting orthopedic follow-up for total ankle replacement versus fusion on the left like before  No results for input(s): GRAMSTAIN, LABORGA in the last 8760 hours.  Assessment and Plan:  Problem List Items Addressed This Visit      Endocrine   Diabetic polyneuropathy ([place])   Relevant Orders   CBC with Differential/Platelet   Uric acid   Sedimentation rate   C-reactive protein   Rheumatoid factor   ANA, IFA Comprehensive Panel   HLA-B27 antigen     Musculoskeletal and Integument   Chronic gout of multiple sites   Relevant Medications   colchicine 0.6 MG tablet    Other Visit Diagnoses    Cellulitis and abscess of foot, except toes    -  Primary   Relevant Orders   CBC with Differential/Platelet   Uric acid   Sedimentation rate   C-reactive protein   Rheumatoid factor   ANA, IFA Comprehensive Panel   HLA-B27 antigen   Pre-ulcerative calluses       Relevant Orders   CBC with Differential/Platelet   Uric acid   Sedimentation rate   C-reactive protein   Rheumatoid factor   ANA, IFA  Comprehensive Panel   HLA-B27 antigen   Cavus deformity of foot, acquired       Relevant Orders   CBC with Differential/Platelet   Uric acid   Sedimentation rate   C-reactive protein   Rheumatoid factor   ANA, IFA Comprehensive Panel   HLA-B27 antigen   Capsulitis           -Examined patient and discussed the progression of the wound and treatment alternatives. -Using a sterile tissue nipper loose skin was debrided from the preulcerative lesion at the bottom of the plantar left foot with no underlying opening noted.  -Advised patient that I am concerned about the recurrence of cellulitis redness warmth and pain at this time will treat patient empirically for infection using a more broad-spectrum antibiotic like Bactrim as well as will prescribe colchicine to make sure he is not having any type of gout flareup since this is recurrent -Ordered CBC and arthritic panel patient to go to PCP to have this blood work performed -Production assistant, radio for patient to leave intact for 5 days and advised patient to monitor cellulitis if extends beyond the dressing layers or if the [employer] worsens his pain to immediately remove and contact office -Advised patient to continue with cam boot or surgical shoe -Advised patient that we will refrain at this time from wearing the brace and will consider reconsulting Liliane Channel once his foot has healed -Patient to return to office in 1 week for follow up care and evaluation or sooner if problems arise.  Advised patient if fails to continue to improve may benefit from a CT or MRI  Landis Martins, DPM

## 2020-03-24 ENCOUNTER — Other Ambulatory Visit: Payer: Self-pay | Admitting: Sports Medicine

## 2020-03-24 DIAGNOSIS — E1142 Type 2 diabetes mellitus with diabetic polyneuropathy: Secondary | ICD-10-CM | POA: Diagnosis not present

## 2020-03-24 DIAGNOSIS — L03119 Cellulitis of unspecified part of limb: Secondary | ICD-10-CM | POA: Diagnosis not present

## 2020-03-24 DIAGNOSIS — L02619 Cutaneous abscess of unspecified foot: Secondary | ICD-10-CM | POA: Diagnosis not present

## 2020-03-24 DIAGNOSIS — M216X9 Other acquired deformities of unspecified foot: Secondary | ICD-10-CM | POA: Diagnosis not present

## 2020-03-29 LAB — CBC WITH DIFFERENTIAL/PLATELET
Basophils Absolute: 0 10*3/uL (ref 0.0–0.2)
Basos: 1 %
EOS (ABSOLUTE): 0.2 10*3/uL (ref 0.0–0.4)
Eos: 3 %
Hematocrit: 45.5 % (ref 37.5–51.0)
Hemoglobin: 15.6 g/dL (ref 13.0–17.7)
Immature Grans (Abs): 0 10*3/uL (ref 0.0–0.1)
Immature Granulocytes: 1 %
Lymphocytes Absolute: 1.5 10*3/uL (ref 0.7–3.1)
Lymphs: 25 %
MCH: 30.6 pg (ref 26.6–33.0)
MCHC: 34.3 g/dL (ref 31.5–35.7)
MCV: 89 fL (ref 79–97)
Monocytes Absolute: 0.6 10*3/uL (ref 0.1–0.9)
Monocytes: 10 %
Neutrophils Absolute: 3.5 10*3/uL (ref 1.4–7.0)
Neutrophils: 60 %
Platelets: 253 10*3/uL (ref 150–450)
RBC: 5.1 x10E6/uL (ref 4.14–5.80)
RDW: 13.6 % (ref 11.6–15.4)
WBC: 5.8 10*3/uL (ref 3.4–10.8)

## 2020-03-29 LAB — SEDIMENTATION RATE: Sed Rate: 33 mm/hr — ABNORMAL HIGH (ref 0–30)

## 2020-03-29 LAB — HLA-B27 ANTIGEN: HLA B27: NEGATIVE

## 2020-03-29 LAB — RHEUMATOID FACTOR: Rheumatoid fact SerPl-aCnc: <10 IU/mL (ref 0.0–13.9)

## 2020-03-29 LAB — URIC ACID: Uric Acid: 6.2 mg/dL (ref 3.8–8.4)

## 2020-03-29 LAB — ANTINUCLEAR ANTIBODIES, IFA: ANA Titer 1: NEGATIVE

## 2020-03-29 LAB — C-REACTIVE PROTEIN: CRP: 17 mg/L — ABNORMAL HIGH (ref 0–10)

## 2020-03-31 ENCOUNTER — Other Ambulatory Visit: Payer: Self-pay

## 2020-03-31 ENCOUNTER — Ambulatory Visit: Payer: PPO | Admitting: Sports Medicine

## 2020-03-31 ENCOUNTER — Telehealth: Payer: Self-pay

## 2020-03-31 DIAGNOSIS — E1142 Type 2 diabetes mellitus with diabetic polyneuropathy: Secondary | ICD-10-CM

## 2020-03-31 DIAGNOSIS — M79672 Pain in left foot: Secondary | ICD-10-CM

## 2020-03-31 DIAGNOSIS — L03119 Cellulitis of unspecified part of limb: Secondary | ICD-10-CM | POA: Diagnosis not present

## 2020-03-31 DIAGNOSIS — L84 Corns and callosities: Secondary | ICD-10-CM | POA: Diagnosis not present

## 2020-03-31 DIAGNOSIS — L02619 Cutaneous abscess of unspecified foot: Secondary | ICD-10-CM

## 2020-03-31 DIAGNOSIS — M216X9 Other acquired deformities of unspecified foot: Secondary | ICD-10-CM | POA: Diagnosis not present

## 2020-03-31 NOTE — Progress Notes (Signed)
Subjective: Samuel Hodge is a 69 y.o. male patient seen in office for follow-up evaluation of cellulitis to the left foot.  Patient reports that the redness has gotten better but it is still a little swollen and painful to walk pain 6-7 out of 10 reports that today's the first day today he tried his tennis shoe and that it feels okay but it is painful when he tries to wear the postoperative shoe because the strap rubs the side of his foot.  Patient denies nausea vomiting fever chills or any other symptoms at this time.  Patient Active Problem List   Diagnosis Date Noted  . Vitamin B12 deficiency 12/13/2016  . Chronic gout of multiple sites 04/04/2016  . Chronic gouty arthritis 04/04/2016  . Gastroesophageal reflux disease without esophagitis 04/04/2016  . Mixed hyperlipidemia 04/04/2016  . Multiple-type hyperlipidemia 04/04/2016  . Benign neoplasm of colon 04/03/2016  . Cardiac arrhythmia 04/03/2016  . Cardiac enlargement 04/03/2016  . Cardiomegaly 04/03/2016  . Diabetes, polyneuropathy ([place]) 04/03/2016  . Diabetic polyneuropathy ([place]) 04/03/2016  . Essential hypertension 04/03/2016  . Generalized OA 04/03/2016  . Hearing loss 04/03/2016  . High risk medication use 04/03/2016  . Hydrocele of testis 04/03/2016  . Hypogonadism in male 04/03/2016  . Obesity (BMI 30-39.9) 04/03/2016  . Peripheral neuropathy 04/03/2016  . Primary osteoarthritis involving multiple joints 04/03/2016  . RLS (restless legs syndrome) 04/03/2016  . Thoracic radiculopathy 04/03/2016  . Thoracic root lesion 04/03/2016  . Primary osteoarthritis of right knee 10/10/2015  . Restless legs syndrome 12/14/2013  . Dyspnea and respiratory abnormality 02/16/2013   Current Outpatient Medications on File Prior to Visit  Medication Sig Dispense Refill  . amLODipine (NORVASC) 5 MG tablet Take 5 mg by mouth daily.    Marland Kitchen apixaban (ELIQUIS) 2.5 MG TABS tablet Take 1 tablet (2.5 mg total) by mouth every 12 (twelve) hours. 60  tablet 0  . azithromycin (ZITHROMAX) 250 MG tablet     . clindamycin (CLEOCIN) 300 MG capsule Take 1 capsule (300 mg total) by mouth 3 (three) times daily. 30 capsule 0  . CLOBETASOL PROPIONATE E 0.05 % emollient cream     . colchicine 0.6 MG tablet Take 1 tablet (0.6 mg total) by mouth daily. 7 tablet 0  . cyclobenzaprine (FLEXERIL) 10 MG tablet Take 10 mg by mouth.    . docusate sodium (COLACE) 100 MG capsule Take 1 capsule (100 mg total) by mouth 2 (two) times daily. (Patient taking differently: Take 100 mg by mouth 2 (two) times daily as needed for mild constipation. ) 60 capsule 3  . doxycycline (VIBRA-TABS) 100 MG tablet Take 1 tablet (100 mg total) by mouth 2 (two) times daily. 20 tablet 0  . empagliflozin (JARDIANCE) 25 MG TABS tablet Jardiance 25 mg tablet    . gabapentin (NEURONTIN) 600 MG tablet Take 1,200 mg by mouth 2 (two) times daily.     . Gabapentin Enacarbil 600 MG TBCR Take 1 pill with evening meal    . glimepiride (AMARYL) 1 MG tablet Take 1 mg by mouth daily with breakfast.    . glucose blood (FREESTYLE LITE) test strip USE 1 STRIP EVERY DAY    . hydrochlorothiazide (HYDRODIURIL) 25 MG tablet Take 25 mg by mouth daily.    Marland Kitchen HYDROcodone-acetaminophen (NORCO) 10-325 MG tablet Take 1 tablet by mouth every 6 (six) hours as needed. 30 tablet 0  . Lancets (FREESTYLE) lancets CHECK BLOOD SUGAR EVERY DAY AS DIRECTED    . latanoprost (XALATAN) 0.005 %  ophthalmic solution     . latanoprost (XALATAN) 0.005 % ophthalmic solution Place 0.005 drops into both eyes daily.    Marland Kitchen lisinopril (PRINIVIL,ZESTRIL) 20 MG tablet Take 30 mg by mouth daily.    . metFORMIN (GLUCOPHAGE-XR) 500 MG 24 hr tablet Take 1,000 mg by mouth 2 (two) times daily.    . ondansetron (ZOFRAN) 4 MG tablet Take 1 tablet (4 mg total) by mouth every 6 (six) hours as needed for nausea. 20 tablet 0  . oxyCODONE (OXYCONTIN) 10 mg 12 hr tablet Take 1 tablet (10 mg total) by mouth PRO. 1 tab PO every 12 hours for 3 days, then 1  tab PO daily for 4 days 10 tablet 0  . pantoprazole (PROTONIX) 40 MG tablet Take 40 mg by mouth daily.    . promethazine (PHENERGAN) 25 MG tablet Take 25 mg by mouth every 6 (six) hours as needed for nausea or vomiting.    Marland Kitchen rOPINIRole (REQUIP XL) 8 MG 24 hr tablet Take 8 mg by mouth at bedtime.    . rosuvastatin (CRESTOR) 10 MG tablet Take 10 mg by mouth.    . senna (SENOKOT) 8.6 MG TABS tablet Take 2 tablets (17.2 mg total) by mouth at bedtime. 60 each 3  . sulfamethoxazole-trimethoprim (BACTRIM) 400-80 MG tablet Take 1 tablet by mouth 2 (two) times daily. 28 tablet 0  . terbinafine (LAMISIL) 250 MG tablet      Current Facility-Administered Medications on File Prior to Visit  Medication Dose Route Frequency Provider Last Rate Last Admin  . triamcinolone acetonide (KENALOG) 10 MG/ML injection 10 mg  10 mg Other Once [place], Zakariya Knickerbocker, DPM      . triamcinolone acetonide (KENALOG-40) injection 20 mg  20 mg Other Once Landis Martins, DPM       Allergies  Allergen Reactions  . Aleve [Naproxen Sodium] Hives  . Aspirin Other (See Comments)    Unsure Unknown "childhood allergy"  . Penicillins Other (See Comments)    Unsure All pt remembers is nose bleeds associated with this reaction.  Has patient had a PCN reaction causing immediate rash, facial/tongue/throat swelling, SOB or lightheadedness with hypotension: Unknown Has patient had a PCN reaction causing severe rash involving mucus membranes or skin necrosis: Unknown Has patient had a PCN reaction that required hospitalization: Yes Has patient had a PCN reaction occurring within the last 10 years: No If all of the above answers are "NO", then may proceed with Cepha  . Latex Rash    Recent Results (from the past 2160 hour(s))  CBC with Differential/Platelet     Status: None   Collection Time: 03/24/20  4:02 PM  Result Value Ref Range   WBC 5.8 3.4 - 10.8 x10E3/uL   RBC 5.10 4.14 - 5.80 x10E6/uL   Hemoglobin 15.6 13.0 - 17.7 g/dL    Hematocrit 45.5 37.5 - 51.0 %   MCV 89 79 - 97 fL   MCH 30.6 26.6 - 33.0 pg   MCHC 34.3 31.5 - 35.7 g/dL   RDW 13.6 11.6 - 15.4 %   Platelets 253 150 - 450 x10E3/uL   Neutrophils 60 Not Estab. %   Lymphs 25 Not Estab. %   Monocytes 10 Not Estab. %   Eos 3 Not Estab. %   Basos 1 Not Estab. %   Neutrophils Absolute 3.5 1.4 - 7.0 x10E3/uL   Lymphocytes Absolute 1.5 0.7 - 3.1 x10E3/uL   Monocytes Absolute 0.6 0.1 - 0.9 x10E3/uL   EOS (ABSOLUTE) 0.2 0.0 -  0.4 x10E3/uL   Basophils Absolute 0.0 0.0 - 0.2 x10E3/uL   Immature Granulocytes 1 Not Estab. %   Immature Grans (Abs) 0.0 0.0 - 0.1 x10E3/uL  Rheumatoid factor     Status: None   Collection Time: 03/24/20  4:02 PM  Result Value Ref Range   Rhuematoid fact SerPl-aCnc <10.0 0.0 - 13.9 IU/mL  HLA-B27 antigen     Status: None (Preliminary result)   Collection Time: 03/24/20  4:02 PM  Result Value Ref Range   HLA B27 WILL FOLLOW   Uric acid     Status: None   Collection Time: 03/24/20  4:02 PM  Result Value Ref Range   Uric Acid 6.2 3.8 - 8.4 mg/dL    Comment:            Therapeutic target for gout patients: <6.0  Sedimentation rate     Status: Abnormal   Collection Time: 03/24/20  4:02 PM  Result Value Ref Range   Sed Rate 33 (H) 0 - 30 mm/hr  C-reactive protein     Status: Abnormal   Collection Time: 03/24/20  4:02 PM  Result Value Ref Range   CRP 17 (H) 0 - 10 mg/L  ANA, IFA (with reflex)     Status: None   Collection Time: 03/24/20  4:02 PM  Result Value Ref Range   ANA Titer 1 Negative     Comment:                                      Negative   <1:80                                      Borderline  1:80                                      Positive   >1:80     Objective: There were no vitals filed for this visit.  General: Patient is awake, alert, oriented x 3 and in no acute distress.  Dermatology: Skin is warm and dry bilateral with a preulcerative callus noted plantar left foot sub met 5, there is no active  drainage, decreased cellulitis at 5th MTPJ and lateral foot on Left.  Vasc: Dorsalis Pedis pulse 1/4 bilateral. Posterior Tibial pulse  1/4 bilateral. Capillary fill time <3 sec 1-5 bilateral. Positive hair growth to the level of the digits. Focal edema on the left at area of cellulitis with no increase in warmth.   Neurology: The patient has intact sensation measured with a 5.07/10g Semmes Weinstein Monofilament at all pedal sites bilateral. Vibratory sensation diminished bilateral with tuning fork. No Babinski sign present bilateral.    Musculoskeletal: +Decreased Pain at 5th met however there is pain to the dorsal midfoot on the left. Severe ankle deformity on left.   No results for input(s): GRAMSTAIN, LABORGA in the last 8760 hours.  Assessment and Plan:  Problem List Items Addressed This Visit      Endocrine   Diabetic polyneuropathy ([place])    Other Visit Diagnoses    Cellulitis and abscess of foot, except toes    -  Primary   Pre-ulcerative calluses       Cavus deformity of foot, acquired  Left foot pain         -Examined patient and discussed the progression of the wound and treatment alternatives. -Lab work reviewed -Due to the cellulitis that is recurrent concerned for possible deeper abscess or infection will order MRI for further eval  -Applied compression sleeve and advised patient to wear daily for edema control -Advised patient to continue with cam boot or tennis shoe with ASO brace as tolerated  -Awaiting shoes and custom afo adjustments  -Patient to return to office after MRI  Landis Martins, DPM

## 2020-03-31 NOTE — Telephone Encounter (Signed)
-----   Message from Landis Martins, [place] sent at 03/30/2020  7:26 AM EDT ----- Regarding: Lab results Please let patient know to continue taking his antibiotics.  His blood work results did show an elevation in his inflammatory markers (sed rate and C-reactive protein) but no elevation in white blood cell count.  This likely means that the antibiotics are working to get rid of any infection or inflammation in the area.  Also if he is able to take by mouth naproxen or Motrin twice daily.  I recommend him to take this in addition to his antibiotics to further help control any infection or inflammation in the area. -Dr. Cannon Kettle

## 2020-03-31 NOTE — Telephone Encounter (Signed)
Pt was advised of lab results and to continue taking abx

## 2020-04-01 ENCOUNTER — Telehealth: Payer: Self-pay | Admitting: *Deleted

## 2020-04-01 DIAGNOSIS — L03119 Cellulitis of unspecified part of limb: Secondary | ICD-10-CM

## 2020-04-01 DIAGNOSIS — L84 Corns and callosities: Secondary | ICD-10-CM

## 2020-04-01 DIAGNOSIS — E1142 Type 2 diabetes mellitus with diabetic polyneuropathy: Secondary | ICD-10-CM

## 2020-04-01 DIAGNOSIS — L02619 Cutaneous abscess of unspecified foot: Secondary | ICD-10-CM

## 2020-04-01 NOTE — Telephone Encounter (Signed)
Faxed orders to [employer] for pre-cert and scheduling.

## 2020-04-01 NOTE — Telephone Encounter (Signed)
-----   Message from [place], [place] sent at 03/31/2020 10:49 AM EDT ----- Regarding: MRI Left foot Recurrent cellulitis and pain 5th MTPJ on left Patient has hip and knee joint implants

## 2020-04-05 NOTE — Telephone Encounter (Signed)
Pt scheduled for MRI of Lt foot at [place] for May 28th, pt checking in at 7:15 for 7:30 appt. Order sheet was faxed to [facility]

## 2020-04-05 NOTE — Telephone Encounter (Signed)
Spoke to [place] from [employer] and authorization is not required for Lt foo MRI w/o contrast

## 2020-04-05 NOTE — Telephone Encounter (Signed)
LVM to pt stating his MRI appt for May 28th at 7:15

## 2020-04-08 DIAGNOSIS — L03116 Cellulitis of left lower limb: Secondary | ICD-10-CM | POA: Diagnosis not present

## 2020-04-08 DIAGNOSIS — L97529 Non-pressure chronic ulcer of other part of left foot with unspecified severity: Secondary | ICD-10-CM | POA: Diagnosis not present

## 2020-04-12 ENCOUNTER — Other Ambulatory Visit: Payer: PPO | Admitting: Orthotics

## 2020-04-12 ENCOUNTER — Other Ambulatory Visit: Payer: Self-pay

## 2020-04-12 DIAGNOSIS — M216X2 Other acquired deformities of left foot: Secondary | ICD-10-CM

## 2020-04-12 DIAGNOSIS — E1142 Type 2 diabetes mellitus with diabetic polyneuropathy: Secondary | ICD-10-CM

## 2020-04-12 DIAGNOSIS — M2041 Other hammer toe(s) (acquired), right foot: Secondary | ICD-10-CM

## 2020-04-12 DIAGNOSIS — M2042 Other hammer toe(s) (acquired), left foot: Secondary | ICD-10-CM

## 2020-04-13 ENCOUNTER — Ambulatory Visit: Payer: PPO | Admitting: Sports Medicine

## 2020-04-13 ENCOUNTER — Encounter: Payer: Self-pay | Admitting: Sports Medicine

## 2020-04-13 DIAGNOSIS — L03119 Cellulitis of unspecified part of limb: Secondary | ICD-10-CM

## 2020-04-13 DIAGNOSIS — M86172 Other acute osteomyelitis, left ankle and foot: Secondary | ICD-10-CM

## 2020-04-13 DIAGNOSIS — M79672 Pain in left foot: Secondary | ICD-10-CM

## 2020-04-13 DIAGNOSIS — L84 Corns and callosities: Secondary | ICD-10-CM

## 2020-04-13 DIAGNOSIS — M216X9 Other acquired deformities of unspecified foot: Secondary | ICD-10-CM

## 2020-04-13 DIAGNOSIS — E1142 Type 2 diabetes mellitus with diabetic polyneuropathy: Secondary | ICD-10-CM

## 2020-04-13 DIAGNOSIS — L02619 Cutaneous abscess of unspecified foot: Secondary | ICD-10-CM | POA: Diagnosis not present

## 2020-04-13 MED ORDER — HYDROCODONE-ACETAMINOPHEN 10-325 MG PO TABS: 1.0000 | ORAL_TABLET | Freq: Four times a day (QID) | ORAL | 0 refills | Status: AC | PRN

## 2020-04-13 MED ORDER — PROMETHAZINE HCL 25 MG PO TABS: 25.0000 mg | ORAL_TABLET | Freq: Three times a day (TID) | ORAL | 0 refills | Status: DC | PRN

## 2020-04-13 MED ORDER — SULFAMETHOXAZOLE-TRIMETHOPRIM 400-80 MG PO TABS: 1.0000 | ORAL_TABLET | Freq: Two times a day (BID) | ORAL | 0 refills | Status: DC

## 2020-04-13 MED ORDER — DOCUSATE SODIUM 100 MG PO CAPS: 100.0000 mg | ORAL_CAPSULE | Freq: Two times a day (BID) | ORAL | 0 refills | Status: DC | PRN

## 2020-04-13 NOTE — Progress Notes (Signed)
Subjective: Samuel Hodge is a 69 y.o. male patient seen in office for follow-up evaluation of cellulitis to the left foot and for discussion of MRI results.  Patient reports that on Sunday the foot started to swell up again and has been hurting pain is 7 out of 10 pulsating staying to the area reports that he has been using Tylenol and been in surgical shoe but denies nausea vomiting fever chills or any other constitutional symptoms at this time.   Fasting blood sugar 126 on yesterday  Patient Active Problem List   Diagnosis Date Noted  . Vitamin B12 deficiency 12/13/2016  . Chronic gout of multiple sites 04/04/2016  . Chronic gouty arthritis 04/04/2016  . Gastroesophageal reflux disease without esophagitis 04/04/2016  . Mixed hyperlipidemia 04/04/2016  . Multiple-type hyperlipidemia 04/04/2016  . Benign neoplasm of colon 04/03/2016  . Cardiac arrhythmia 04/03/2016  . Cardiac enlargement 04/03/2016  . Cardiomegaly 04/03/2016  . Diabetes, polyneuropathy ([place]) 04/03/2016  . Diabetic polyneuropathy ([place]) 04/03/2016  . Essential hypertension 04/03/2016  . Generalized OA 04/03/2016  . Hearing loss 04/03/2016  . High risk medication use 04/03/2016  . Hydrocele of testis 04/03/2016  . Hypogonadism in male 04/03/2016  . Obesity (BMI 30-39.9) 04/03/2016  . Peripheral neuropathy 04/03/2016  . Primary osteoarthritis involving multiple joints 04/03/2016  . RLS (restless legs syndrome) 04/03/2016  . Thoracic radiculopathy 04/03/2016  . Thoracic root lesion 04/03/2016  . Primary osteoarthritis of right knee 10/10/2015  . Restless legs syndrome 12/14/2013  . Dyspnea and respiratory abnormality 02/16/2013   Current Outpatient Medications on File Prior to Visit  Medication Sig Dispense Refill  . amLODipine (NORVASC) 5 MG tablet Take 5 mg by mouth daily.    Marland Kitchen apixaban (ELIQUIS) 2.5 MG TABS tablet Take 1 tablet (2.5 mg total) by mouth every 12 (twelve) hours. 60 tablet 0  . azithromycin  (ZITHROMAX) 250 MG tablet     . clindamycin (CLEOCIN) 300 MG capsule Take 1 capsule (300 mg total) by mouth 3 (three) times daily. 30 capsule 0  . CLOBETASOL PROPIONATE E 0.05 % emollient cream     . colchicine 0.6 MG tablet Take 1 tablet (0.6 mg total) by mouth daily. 7 tablet 0  . cyclobenzaprine (FLEXERIL) 10 MG tablet Take 10 mg by mouth.    . doxycycline (VIBRA-TABS) 100 MG tablet Take 1 tablet (100 mg total) by mouth 2 (two) times daily. 20 tablet 0  . empagliflozin (JARDIANCE) 25 MG TABS tablet Jardiance 25 mg tablet    . gabapentin (NEURONTIN) 600 MG tablet Take 1,200 mg by mouth 2 (two) times daily.     . Gabapentin Enacarbil 600 MG TBCR Take 1 pill with evening meal    . glimepiride (AMARYL) 1 MG tablet Take 1 mg by mouth daily with breakfast.    . glucose blood (FREESTYLE LITE) test strip USE 1 STRIP EVERY DAY    . hydrochlorothiazide (HYDRODIURIL) 25 MG tablet Take 25 mg by mouth daily.    . Lancets (FREESTYLE) lancets CHECK BLOOD SUGAR EVERY DAY AS DIRECTED    . latanoprost (XALATAN) 0.005 % ophthalmic solution     . latanoprost (XALATAN) 0.005 % ophthalmic solution Place 0.005 drops into both eyes daily.    Marland Kitchen lisinopril (PRINIVIL,ZESTRIL) 20 MG tablet Take 30 mg by mouth daily.    . metFORMIN (GLUCOPHAGE-XR) 500 MG 24 hr tablet Take 1,000 mg by mouth 2 (two) times daily.    . ondansetron (ZOFRAN) 4 MG tablet Take 1 tablet (4 mg total)  by mouth every 6 (six) hours as needed for nausea. 20 tablet 0  . oxyCODONE (OXYCONTIN) 10 mg 12 hr tablet Take 1 tablet (10 mg total) by mouth PRO. 1 tab PO every 12 hours for 3 days, then 1 tab PO daily for 4 days 10 tablet 0  . pantoprazole (PROTONIX) 40 MG tablet Take 40 mg by mouth daily.    Marland Kitchen rOPINIRole (REQUIP XL) 8 MG 24 hr tablet Take 8 mg by mouth at bedtime.    . rosuvastatin (CRESTOR) 10 MG tablet Take 10 mg by mouth.    . senna (SENOKOT) 8.6 MG TABS tablet Take 2 tablets (17.2 mg total) by mouth at bedtime. 60 each 3  . terbinafine  (LAMISIL) 250 MG tablet      Current Facility-Administered Medications on File Prior to Visit  Medication Dose Route Frequency Provider Last Rate Last Admin  . triamcinolone acetonide (KENALOG) 10 MG/ML injection 10 mg  10 mg Other Once [place], Amalee Olsen, DPM      . triamcinolone acetonide (KENALOG-40) injection 20 mg  20 mg Other Once Landis Martins, DPM       Allergies  Allergen Reactions  . Aleve [Naproxen Sodium] Hives  . Aspirin Other (See Comments)    Unsure Unknown "childhood allergy"  . Penicillins Other (See Comments)    Unsure All pt remembers is nose bleeds associated with this reaction.  Has patient had a PCN reaction causing immediate rash, facial/tongue/throat swelling, SOB or lightheadedness with hypotension: Unknown Has patient had a PCN reaction causing severe rash involving mucus membranes or skin necrosis: Unknown Has patient had a PCN reaction that required hospitalization: Yes Has patient had a PCN reaction occurring within the last 10 years: No If all of the above answers are "NO", then may proceed with Cepha  . Latex Rash    Recent Results (from the past 2160 hour(s))  CBC with Differential/Platelet     Status: None   Collection Time: 03/24/20  4:02 PM  Result Value Ref Range   WBC 5.8 3.4 - 10.8 x10E3/uL   RBC 5.10 4.14 - 5.80 x10E6/uL   Hemoglobin 15.6 13.0 - 17.7 g/dL   Hematocrit 45.5 37.5 - 51.0 %   MCV 89 79 - 97 fL   MCH 30.6 26.6 - 33.0 pg   MCHC 34.3 31.5 - 35.7 g/dL   RDW 13.6 11.6 - 15.4 %   Platelets 253 150 - 450 x10E3/uL   Neutrophils 60 Not Estab. %   Lymphs 25 Not Estab. %   Monocytes 10 Not Estab. %   Eos 3 Not Estab. %   Basos 1 Not Estab. %   Neutrophils Absolute 3.5 1.4 - 7.0 x10E3/uL   Lymphocytes Absolute 1.5 0.7 - 3.1 x10E3/uL   Monocytes Absolute 0.6 0.1 - 0.9 x10E3/uL   EOS (ABSOLUTE) 0.2 0.0 - 0.4 x10E3/uL   Basophils Absolute 0.0 0.0 - 0.2 x10E3/uL   Immature Granulocytes 1 Not Estab. %   Immature Grans (Abs) 0.0 0.0 -  0.1 x10E3/uL  Rheumatoid factor     Status: None   Collection Time: 03/24/20  4:02 PM  Result Value Ref Range   Rhuematoid fact SerPl-aCnc <10.0 0.0 - 13.9 IU/mL  HLA-B27 antigen     Status: None   Collection Time: 03/24/20  4:02 PM  Result Value Ref Range   HLA B27 Negative     Comment: HLA-B*27 Negative B27 allele interpretation for all loci based on IMGT/HLA database version 3.38 This test was developed and its  performance characteristics determined by [facility].  It has not been cleared or approved by the Food and Drug Administration. HLA Lab CLIA ID Number [id number] This test was performed using PCR (Polymerase Chain Reaction)/SSOP (Sequence Specific Oligonucleotide Probes) technique.  SBT (Sequence Based Typing) and/or SSP (Sequence Specific Primers) may be used as supplemental methods when necessary.  Please contact HLA Customer Service at [phone] if you have any questions.  Director of HLA Laboratory  Dr Brooks Sailors, PhD   Uric acid     Status: None   Collection Time: 03/24/20  4:02 PM  Result Value Ref Range   Uric Acid 6.2 3.8 - 8.4 mg/dL    Comment:            Therapeutic target for gout patients: <6.0  Sedimentation rate     Status: Abnormal   Collection Time: 03/24/20  4:02 PM  Result Value Ref Range   Sed Rate 33 (H) 0 - 30 mm/hr  C-reactive protein     Status: Abnormal   Collection Time: 03/24/20  4:02 PM  Result Value Ref Range   CRP 17 (H) 0 - 10 mg/L  ANA, IFA (with reflex)     Status: None   Collection Time: 03/24/20  4:02 PM  Result Value Ref Range   ANA Titer 1 Negative     Comment:                                      Negative   <1:80                                      Borderline  1:80                                      Positive   >1:80    Past Surgical History:  Procedure Laterality Date  . COLONOSCOPY    . ESOPHAGOGASTRODUODENOSCOPY    . FRACTURE SURGERY  1968   left hip  . HAND SURGERY  1973   rt hand  . HYDROCELE EXCISION /  REPAIR  1986/1974  . ROTATOR CUFF REPAIR  2013   rt shoulder  . TONSILLECTOMY    . TOTAL HIP ARTHROPLASTY Right 08/19/2015   Procedure: RIGHT TOTAL HIP ARTHROPLASTY ANTERIOR APPROACH;  Surgeon: Rod Can, MD;  Location: [facility];  Service: Orthopedics;  Laterality: Right;  . TOTAL KNEE ARTHROPLASTY Right 10/10/2015   Procedure: TOTAL RIGHT  KNEE ARTHROPLASTY;  Surgeon: Rod Can, MD;  Location: [facility];  Service: Orthopedics;  Laterality: Right;    No family history on file. Social History   Socioeconomic History  . Marital status: Married    Spouse name: Not on file  . Number of children: Not on file  . Years of education: Not on file  . Highest education level: Not on file  Occupational History  . Not on file  Tobacco Use  . Smoking status: Never Smoker  . Smokeless tobacco: Never Used  Substance and Sexual Activity  . Alcohol use: No  . Drug use: No  . Sexual activity: Never  Other Topics Concern  . Not on file  Social History Narrative  . Not on file   Social Determinants of Health  Financial Resource Strain:   . Difficulty of Paying Living Expenses:   Food Insecurity:   . Worried About Charity fundraiser in the Last Year:   . Arboriculturist in the Last Year:   Transportation Needs:   . Film/video editor (Medical):   Marland Kitchen Lack of Transportation (Non-Medical):   Physical Activity:   . Days of Exercise per Week:   . Minutes of Exercise per Session:   Stress:   . Feeling of Stress :   Social Connections:   . Frequency of Communication with Friends and Family:   . Frequency of Social Gatherings with Friends and Family:   . Attends Religious Services:   . Active Member of Clubs or Organizations:   . Attends Archivist Meetings:   Marland Kitchen Marital Status:     Objective: There were no vitals filed for this visit.  General: Patient is awake, alert, oriented x 3 and in no acute distress.  Dermatology: Skin is warm and dry bilateral with a  preulcerative callus noted plantar left foot sub met 5, there is increase focal cellulitis, swelling, and warmth on Left.  Vasc: Dorsalis Pedis pulse 1/4 bilateral. Posterior Tibial pulse  1/4 bilateral. Capillary fill time <3 sec 1-5 bilateral. Positive hair growth to the level of the digits. Focal edema on the left at area of cellulitis with warmth as noted above.   Neurology: Gross sensation present via light touch with increased pain to affected area.   Musculoskeletal: +Pain at 5th met however there is pain to the dorsal midfoot on the left. Severe ankle deformity on left.   MRI suggestive of osteomyelitis  No results for input(s): GRAMSTAIN, LABORGA in the last 8760 hours.  Assessment and Plan:  Problem List Items Addressed This Visit      Endocrine   Diabetes, polyneuropathy ([place])    Other Visit Diagnoses    Cellulitis and abscess of foot, except toes    -  Primary   Acute osteomyelitis of left ankle or foot ([facility])       Relevant Medications   sulfamethoxazole-trimethoprim (BACTRIM) 400-80 MG tablet   Pre-ulcerative calluses       Cavus deformity of foot, acquired       Left foot pain         -Examined patient and discussed the progression of the wound and treatment alternatives. -MRI results reviewed suggestive of osteomyelitis at the fifth metatarsal stump -Patient opt for surgical management. Consent obtained for fifth metatarsal resection/removal of infected bone left foot Pre and Post op course explained. Risks, benefits, alternatives explained. No guarantees given or implied. Surgical booking slip submitted and provided patient with Surgical packet and info for [place] -Advised patient to hold off on wearing his diabetic shoes at this time and continue with postoperative shoe compression sleeve and I refill antibiotics and take pain medicine and as needed meds for patient to use now and after surgery -Patient to return to office after surgery or sooner if problems or issues  arise.  Landis Martins, DPM

## 2020-04-13 NOTE — Patient Instructions (Signed)
Pre-Operative Instructions  Congratulations, you have decided to take an important step towards improving your quality of life.  You can be assured that the doctors and staff at [facility] will be with you every step of the way.  Here are some important things you should know:  1. Plan to be at the surgery center/hospital at least 1 (one) hour prior to your scheduled time, unless otherwise directed by the surgical center/hospital staff.  You must have a responsible adult accompany you, remain during the surgery and drive you home.  Make sure you have directions to the surgical center/hospital to ensure you arrive on time. 2. If you are having surgery at [facility] or [facility], you will need a copy of your medical history and physical form from your family physician within one month prior to the date of surgery. We will give you a form for your primary physician to complete.  3. We make every effort to accommodate the date you request for surgery.  However, there are times where surgery dates or times have to be moved.  We will contact you as soon as possible if a change in schedule is required.   4. No aspirin/ibuprofen for one week before surgery.  If you are on aspirin, any non-steroidal anti-inflammatory medications (Mobic, Aleve, Ibuprofen) should not be taken seven (7) days prior to your surgery.  You make take Tylenol for pain prior to surgery.  5. Medications - If you are taking daily heart and blood pressure medications, seizure, reflux, allergy, asthma, anxiety, pain or diabetes medications, make sure you notify the surgery center/hospital before the day of surgery so they can tell you which medications you should take or avoid the day of surgery. 6. No food or drink after midnight the night before surgery unless directed otherwise by surgical center/hospital staff. 7. No alcoholic beverages 24-hours prior to surgery.  No smoking 24-hours prior or 24-hours after  surgery. 8. Wear loose pants or shorts. They should be loose enough to fit over bandages, boots, and casts. 9. Don't wear slip-on shoes. Sneakers are preferred. 10. Bring your boot with you to the surgery center/hospital.  Also bring crutches or a walker if your physician has prescribed it for you.  If you do not have this equipment, it will be provided for you after surgery. 11. If you have not been contacted by the surgery center/hospital by the day before your surgery, call to confirm the date and time of your surgery. 12. Leave-time from work may vary depending on the type of surgery you have.  Appropriate arrangements should be made prior to surgery with your employer. 13. Prescriptions will be provided immediately following surgery by your doctor.  Fill these as soon as possible after surgery and take the medication as directed. Pain medications will not be refilled on weekends and must be approved by the doctor. 14. Remove nail polish on the operative foot and avoid getting pedicures prior to surgery. 15. Wash the night before surgery.  The night before surgery wash the foot and leg well with water and the antibacterial soap provided. Be sure to pay special attention to beneath the toenails and in between the toes.  Wash for at least three (3) minutes. Rinse thoroughly with water and dry well with a towel.  Perform this wash unless told not to do so by your physician.  Enclosed: 1 Ice pack (please put in freezer the night before surgery)   1 Hibiclens skin cleaner     Pre-op instructions  If you have any questions regarding the instructions, please do not hesitate to call our office.  [facility]: [address], [place], [place] [address] -- [phone]  [facility]: [address] [place], [place] [address] -- [phone]  [facility]: [address], [place], [place] [address] -- [phone]   Website: [email or web address] 

## 2020-04-14 ENCOUNTER — Telehealth: Payer: Self-pay

## 2020-04-14 NOTE — Telephone Encounter (Signed)
DOS 04/18/2020  METATARSECTOMY 5TH LT - [id number]  RECEIVED AUTHORIZATION FROM [facility] - AUTH [id number] GOOD FROM 04/18/20 - 07/17/2020

## 2020-04-18 ENCOUNTER — Telehealth: Payer: Self-pay | Admitting: Sports Medicine

## 2020-04-18 ENCOUNTER — Encounter: Payer: Self-pay | Admitting: Sports Medicine

## 2020-04-18 DIAGNOSIS — I10 Essential (primary) hypertension: Secondary | ICD-10-CM | POA: Diagnosis not present

## 2020-04-18 DIAGNOSIS — M86172 Other acute osteomyelitis, left ankle and foot: Secondary | ICD-10-CM | POA: Diagnosis not present

## 2020-04-18 DIAGNOSIS — M86672 Other chronic osteomyelitis, left ankle and foot: Secondary | ICD-10-CM | POA: Diagnosis not present

## 2020-04-18 NOTE — Telephone Encounter (Signed)
Received message from [facility] stating they needed a rx for diabetic shoes. They did receive the prior auth form and need it by Monday or they will have to send it form medical review.  I called back and refaxed it Friday and today trying to get everything to go thru. I told Tammy I have never had to have an rx ever before. Dr Cannon Kettle is in surgery today so I could not get a rx. I explained that originally it was faxed from [facility] and would not go thru but I had called and they said they had received it 17 times. She said then they must have everything they need.

## 2020-04-19 ENCOUNTER — Telehealth: Payer: Self-pay | Admitting: Sports Medicine

## 2020-04-19 NOTE — Telephone Encounter (Signed)
Postoperative check phone call made to patient.  Patient did not answer left voicemail advising patient to call office if there are any postop problems or concerns otherwise if he is doing good look forward to seeing him as scheduled next week.

## 2020-04-27 ENCOUNTER — Other Ambulatory Visit: Payer: Self-pay

## 2020-04-27 ENCOUNTER — Other Ambulatory Visit: Payer: Self-pay | Admitting: Sports Medicine

## 2020-04-27 ENCOUNTER — Ambulatory Visit (INDEPENDENT_AMBULATORY_CARE_PROVIDER_SITE_OTHER): Payer: PPO | Admitting: Sports Medicine

## 2020-04-27 ENCOUNTER — Encounter: Payer: Self-pay | Admitting: Sports Medicine

## 2020-04-27 ENCOUNTER — Ambulatory Visit (INDEPENDENT_AMBULATORY_CARE_PROVIDER_SITE_OTHER): Payer: PPO

## 2020-04-27 DIAGNOSIS — L02612 Cutaneous abscess of left foot: Secondary | ICD-10-CM | POA: Diagnosis not present

## 2020-04-27 DIAGNOSIS — L03032 Cellulitis of left toe: Secondary | ICD-10-CM | POA: Diagnosis not present

## 2020-04-27 DIAGNOSIS — Z9889 Other specified postprocedural states: Secondary | ICD-10-CM

## 2020-04-27 DIAGNOSIS — L84 Corns and callosities: Secondary | ICD-10-CM

## 2020-04-27 DIAGNOSIS — L03119 Cellulitis of unspecified part of limb: Secondary | ICD-10-CM

## 2020-04-27 DIAGNOSIS — E1142 Type 2 diabetes mellitus with diabetic polyneuropathy: Secondary | ICD-10-CM

## 2020-04-27 DIAGNOSIS — L02619 Cutaneous abscess of unspecified foot: Secondary | ICD-10-CM

## 2020-04-27 DIAGNOSIS — M86172 Other acute osteomyelitis, left ankle and foot: Secondary | ICD-10-CM

## 2020-04-27 NOTE — Progress Notes (Signed)
Subjective: Samuel Hodge is a 69 y.o. male patient seen today in office for POV #1 (DOS 04-18-20), S/P left fifth metatarsectomy. Patient denies pain at surgical site, reports that he took pain pill once now gets a lot of twinges of pain occasionally 2 out of 10, denies calf pain, denies headache, chest pain, shortness of breath, nausea, vomiting, fever, or chills.  No other issues noted.   Patient Active Problem List   Diagnosis Date Noted  . Vitamin B12 deficiency 12/13/2016  . Chronic gout of multiple sites 04/04/2016  . Chronic gouty arthritis 04/04/2016  . Gastroesophageal reflux disease without esophagitis 04/04/2016  . Mixed hyperlipidemia 04/04/2016  . Multiple-type hyperlipidemia 04/04/2016  . Benign neoplasm of colon 04/03/2016  . Cardiac arrhythmia 04/03/2016  . Cardiac enlargement 04/03/2016  . Cardiomegaly 04/03/2016  . Diabetes, polyneuropathy ([place]) 04/03/2016  . Diabetic polyneuropathy ([place]) 04/03/2016  . Essential hypertension 04/03/2016  . Generalized OA 04/03/2016  . Hearing loss 04/03/2016  . High risk medication use 04/03/2016  . Hydrocele of testis 04/03/2016  . Hypogonadism in male 04/03/2016  . Obesity (BMI 30-39.9) 04/03/2016  . Peripheral neuropathy 04/03/2016  . Primary osteoarthritis involving multiple joints 04/03/2016  . RLS (restless legs syndrome) 04/03/2016  . Thoracic radiculopathy 04/03/2016  . Thoracic root lesion 04/03/2016  . Primary osteoarthritis of right knee 10/10/2015  . Restless legs syndrome 12/14/2013  . Dyspnea and respiratory abnormality 02/16/2013    Current Outpatient Medications on File Prior to Visit  Medication Sig Dispense Refill  . amLODipine (NORVASC) 5 MG tablet Take 5 mg by mouth daily.    Marland Kitchen apixaban (ELIQUIS) 2.5 MG TABS tablet Take 1 tablet (2.5 mg total) by mouth every 12 (twelve) hours. 60 tablet 0  . azithromycin (ZITHROMAX) 250 MG tablet     . clindamycin (CLEOCIN) 300 MG capsule Take 1 capsule (300 mg total) by  mouth 3 (three) times daily. 30 capsule 0  . CLOBETASOL PROPIONATE E 0.05 % emollient cream     . colchicine 0.6 MG tablet Take 1 tablet (0.6 mg total) by mouth daily. 7 tablet 0  . cyclobenzaprine (FLEXERIL) 10 MG tablet Take 10 mg by mouth.    . docusate sodium (COLACE) 100 MG capsule Take 1 capsule (100 mg total) by mouth 2 (two) times daily as needed for mild constipation. 20 capsule 0  . doxycycline (VIBRA-TABS) 100 MG tablet Take 1 tablet (100 mg total) by mouth 2 (two) times daily. 20 tablet 0  . empagliflozin (JARDIANCE) 25 MG TABS tablet Jardiance 25 mg tablet    . gabapentin (NEURONTIN) 600 MG tablet Take 1,200 mg by mouth 2 (two) times daily.     . Gabapentin Enacarbil 600 MG TBCR Take 1 pill with evening meal    . glimepiride (AMARYL) 1 MG tablet Take 1 mg by mouth daily with breakfast.    . glucose blood (FREESTYLE LITE) test strip USE 1 STRIP EVERY DAY    . hydrochlorothiazide (HYDRODIURIL) 25 MG tablet Take 25 mg by mouth daily.    . Lancets (FREESTYLE) lancets CHECK BLOOD SUGAR EVERY DAY AS DIRECTED    . latanoprost (XALATAN) 0.005 % ophthalmic solution     . latanoprost (XALATAN) 0.005 % ophthalmic solution Place 0.005 drops into both eyes daily.    Marland Kitchen lisinopril (PRINIVIL,ZESTRIL) 20 MG tablet Take 30 mg by mouth daily.    . metFORMIN (GLUCOPHAGE-XR) 500 MG 24 hr tablet Take 1,000 mg by mouth 2 (two) times daily.    . ondansetron (ZOFRAN)  4 MG tablet Take 1 tablet (4 mg total) by mouth every 6 (six) hours as needed for nausea. 20 tablet 0  . oxyCODONE (OXYCONTIN) 10 mg 12 hr tablet Take 1 tablet (10 mg total) by mouth PRO. 1 tab PO every 12 hours for 3 days, then 1 tab PO daily for 4 days 10 tablet 0  . pantoprazole (PROTONIX) 40 MG tablet Take 40 mg by mouth daily.    . promethazine (PHENERGAN) 25 MG tablet Take 1 tablet (25 mg total) by mouth every 8 (eight) hours as needed for nausea or vomiting. 30 tablet 0  . rOPINIRole (REQUIP XL) 8 MG 24 hr tablet Take 8 mg by mouth at  bedtime.    . rosuvastatin (CRESTOR) 10 MG tablet Take 10 mg by mouth.    . senna (SENOKOT) 8.6 MG TABS tablet Take 2 tablets (17.2 mg total) by mouth at bedtime. 60 each 3  . sulfamethoxazole-trimethoprim (BACTRIM) 400-80 MG tablet Take 1 tablet by mouth 2 (two) times daily. 28 tablet 0  . terbinafine (LAMISIL) 250 MG tablet      Current Facility-Administered Medications on File Prior to Visit  Medication Dose Route Frequency Provider Last Rate Last Admin  . triamcinolone acetonide (KENALOG) 10 MG/ML injection 10 mg  10 mg Other Once [place], Tineshia Becraft, DPM      . triamcinolone acetonide (KENALOG-40) injection 20 mg  20 mg Other Once Landis Martins, DPM        Allergies  Allergen Reactions  . Aleve [Naproxen Sodium] Hives  . Aspirin Other (See Comments)    Unsure Unknown "childhood allergy"  . Penicillins Other (See Comments)    Unsure All pt remembers is nose bleeds associated with this reaction.  Has patient had a PCN reaction causing immediate rash, facial/tongue/throat swelling, SOB or lightheadedness with hypotension: Unknown Has patient had a PCN reaction causing severe rash involving mucus membranes or skin necrosis: Unknown Has patient had a PCN reaction that required hospitalization: Yes Has patient had a PCN reaction occurring within the last 10 years: No If all of the above answers are "NO", then may proceed with Cepha  . Latex Rash    Objective: There were no vitals filed for this visit.  General: No acute distress, AAOx3  Left foot: Sutures and staples intact with no gapping or dehiscence at surgical site, mild swelling to left foot, much improved erythema, no warmth, no drainage, no signs of infection noted, Capillary fill time <3 seconds in all digits, gross sensation present via light touch to left foot. No pain or crepitation with range of motion left foot.  No pain with calf compression.   Post Op Xray, Left foot: Status post almost complete fifth metatarsectomy,  soft tissue swelling within normal limits for post op status.   Assessment and Plan:  Problem List Items Addressed This Visit      Endocrine   Diabetic polyneuropathy ([place])    Other Visit Diagnoses    S/P foot surgery, left    -  Primary   Cellulitis and abscess of foot, except toes       Acute osteomyelitis of left ankle or foot ([place])       Pre-ulcerative calluses           -Patient seen and evaluated -Applied dry sterile dressing to surgical site left foot secured with ACE wrap and stockinet  -Advised patient to make sure to keep dressings clean, dry, and intact to left foot at surgical site -Advised patient to  continue with post-op shoe on left foot with cane -Advised patient to limit activity to necessity  -Advised patient to ice and elevate as necessary  -Continue with as needed meds -Will plan for possible suture removal at next office visit. In the meantime, patient to call office if any issues or problems arise.  We will rediscuss at next office visit possibility of him going to a baseball game.  Landis Martins, DPM

## 2020-04-28 ENCOUNTER — Other Ambulatory Visit: Payer: Self-pay | Admitting: Sports Medicine

## 2020-04-28 DIAGNOSIS — L03032 Cellulitis of left toe: Secondary | ICD-10-CM

## 2020-04-28 DIAGNOSIS — L02612 Cutaneous abscess of left foot: Secondary | ICD-10-CM

## 2020-04-28 DIAGNOSIS — Z9889 Other specified postprocedural states: Secondary | ICD-10-CM

## 2020-05-04 ENCOUNTER — Encounter: Payer: Self-pay | Admitting: Sports Medicine

## 2020-05-04 ENCOUNTER — Other Ambulatory Visit: Payer: Self-pay

## 2020-05-04 ENCOUNTER — Ambulatory Visit (INDEPENDENT_AMBULATORY_CARE_PROVIDER_SITE_OTHER): Payer: PPO | Admitting: Sports Medicine

## 2020-05-04 DIAGNOSIS — L03119 Cellulitis of unspecified part of limb: Secondary | ICD-10-CM

## 2020-05-04 DIAGNOSIS — L02619 Cutaneous abscess of unspecified foot: Secondary | ICD-10-CM

## 2020-05-04 DIAGNOSIS — M216X9 Other acquired deformities of unspecified foot: Secondary | ICD-10-CM

## 2020-05-04 DIAGNOSIS — M86172 Other acute osteomyelitis, left ankle and foot: Secondary | ICD-10-CM

## 2020-05-04 DIAGNOSIS — E1142 Type 2 diabetes mellitus with diabetic polyneuropathy: Secondary | ICD-10-CM

## 2020-05-04 DIAGNOSIS — Z9889 Other specified postprocedural states: Secondary | ICD-10-CM

## 2020-05-04 NOTE — Progress Notes (Signed)
Subjective: Samuel Hodge is a 69 y.o. male patient seen today in office for POV # 2 (DOS 04-18-20), S/P left fifth metatarsectomy. Patient denies pain at surgical site except occasional sharp shooting pain or twinges related to his neuropathy, reports that his church group has canceled the outing for Saturday, denies calf pain, denies headache, chest pain, shortness of breath, nausea, vomiting, fever, or chills.  No other issues noted.   Patient Active Problem List   Diagnosis Date Noted  . Vitamin B12 deficiency 12/13/2016  . Chronic gout of multiple sites 04/04/2016  . Chronic gouty arthritis 04/04/2016  . Gastroesophageal reflux disease without esophagitis 04/04/2016  . Mixed hyperlipidemia 04/04/2016  . Multiple-type hyperlipidemia 04/04/2016  . Benign neoplasm of colon 04/03/2016  . Cardiac arrhythmia 04/03/2016  . Cardiac enlargement 04/03/2016  . Cardiomegaly 04/03/2016  . Diabetes, polyneuropathy ([place]) 04/03/2016  . Diabetic polyneuropathy ([place]) 04/03/2016  . Essential hypertension 04/03/2016  . Generalized OA 04/03/2016  . Hearing loss 04/03/2016  . High risk medication use 04/03/2016  . Hydrocele of testis 04/03/2016  . Hypogonadism in male 04/03/2016  . Obesity (BMI 30-39.9) 04/03/2016  . Peripheral neuropathy 04/03/2016  . Primary osteoarthritis involving multiple joints 04/03/2016  . RLS (restless legs syndrome) 04/03/2016  . Thoracic radiculopathy 04/03/2016  . Thoracic root lesion 04/03/2016  . Primary osteoarthritis of right knee 10/10/2015  . Restless legs syndrome 12/14/2013  . Dyspnea and respiratory abnormality 02/16/2013    Current Outpatient Medications on File Prior to Visit  Medication Sig Dispense Refill  . amLODipine (NORVASC) 5 MG tablet Take 5 mg by mouth daily.    Marland Kitchen apixaban (ELIQUIS) 2.5 MG TABS tablet Take 1 tablet (2.5 mg total) by mouth every 12 (twelve) hours. 60 tablet 0  . azithromycin (ZITHROMAX) 250 MG tablet     . clindamycin (CLEOCIN)  300 MG capsule Take 1 capsule (300 mg total) by mouth 3 (three) times daily. 30 capsule 0  . CLOBETASOL PROPIONATE E 0.05 % emollient cream     . colchicine 0.6 MG tablet Take 1 tablet (0.6 mg total) by mouth daily. 7 tablet 0  . cyclobenzaprine (FLEXERIL) 10 MG tablet Take 10 mg by mouth.    . docusate sodium (COLACE) 100 MG capsule Take 1 capsule (100 mg total) by mouth 2 (two) times daily as needed for mild constipation. 20 capsule 0  . doxycycline (VIBRA-TABS) 100 MG tablet Take 1 tablet (100 mg total) by mouth 2 (two) times daily. 20 tablet 0  . empagliflozin (JARDIANCE) 25 MG TABS tablet Jardiance 25 mg tablet    . gabapentin (NEURONTIN) 600 MG tablet Take 1,200 mg by mouth 2 (two) times daily.     . Gabapentin Enacarbil 600 MG TBCR Take 1 pill with evening meal    . glimepiride (AMARYL) 1 MG tablet Take 1 mg by mouth daily with breakfast.    . glucose blood (FREESTYLE LITE) test strip USE 1 STRIP EVERY DAY    . hydrochlorothiazide (HYDRODIURIL) 25 MG tablet Take 25 mg by mouth daily.    . Lancets (FREESTYLE) lancets CHECK BLOOD SUGAR EVERY DAY AS DIRECTED    . latanoprost (XALATAN) 0.005 % ophthalmic solution     . latanoprost (XALATAN) 0.005 % ophthalmic solution Place 0.005 drops into both eyes daily.    Marland Kitchen lisinopril (PRINIVIL,ZESTRIL) 20 MG tablet Take 30 mg by mouth daily.    . metFORMIN (GLUCOPHAGE-XR) 500 MG 24 hr tablet Take 1,000 mg by mouth 2 (two) times daily.    Marland Kitchen  ondansetron (ZOFRAN) 4 MG tablet Take 1 tablet (4 mg total) by mouth every 6 (six) hours as needed for nausea. 20 tablet 0  . oxyCODONE (OXYCONTIN) 10 mg 12 hr tablet Take 1 tablet (10 mg total) by mouth PRO. 1 tab PO every 12 hours for 3 days, then 1 tab PO daily for 4 days 10 tablet 0  . pantoprazole (PROTONIX) 40 MG tablet Take 40 mg by mouth daily.    . promethazine (PHENERGAN) 25 MG tablet Take 1 tablet (25 mg total) by mouth every 8 (eight) hours as needed for nausea or vomiting. 30 tablet 0  . rOPINIRole  (REQUIP XL) 8 MG 24 hr tablet Take 8 mg by mouth at bedtime.    . rosuvastatin (CRESTOR) 10 MG tablet Take 10 mg by mouth.    . senna (SENOKOT) 8.6 MG TABS tablet Take 2 tablets (17.2 mg total) by mouth at bedtime. 60 each 3  . sulfamethoxazole-trimethoprim (BACTRIM) 400-80 MG tablet Take 1 tablet by mouth 2 (two) times daily. 28 tablet 0  . terbinafine (LAMISIL) 250 MG tablet      Current Facility-Administered Medications on File Prior to Visit  Medication Dose Route Frequency Provider Last Rate Last Admin  . triamcinolone acetonide (KENALOG) 10 MG/ML injection 10 mg  10 mg Other Once [place], Christianjames Soule, DPM      . triamcinolone acetonide (KENALOG-40) injection 20 mg  20 mg Other Once Landis Martins, DPM        Allergies  Allergen Reactions  . Aleve [Naproxen Sodium] Hives  . Aspirin Other (See Comments)    Unsure Unknown "childhood allergy"  . Penicillins Other (See Comments)    Unsure All pt remembers is nose bleeds associated with this reaction.  Has patient had a PCN reaction causing immediate rash, facial/tongue/throat swelling, SOB or lightheadedness with hypotension: Unknown Has patient had a PCN reaction causing severe rash involving mucus membranes or skin necrosis: Unknown Has patient had a PCN reaction that required hospitalization: Yes Has patient had a PCN reaction occurring within the last 10 years: No If all of the above answers are "NO", then may proceed with Cepha  . Latex Rash    Objective: There were no vitals filed for this visit.  General: No acute distress, AAOx3  Left foot: Sutures and staples intact with no gapping or dehiscence at surgical site, mild swelling to left foot, much improved erythema, no warmth, minimal bloody drainage, no signs of infection noted, Capillary fill time <3 seconds in all digits, gross sensation present via light touch to left foot. No pain or crepitation with range of motion left foot.  No pain with calf compression.   Nails mildly  elongated bilateral.  Assessment and Plan:  Problem List Items Addressed This Visit      Endocrine   Diabetic polyneuropathy ([place])    Other Visit Diagnoses    S/P foot surgery, left    -  Primary   Cellulitis and abscess of foot, except toes       Acute osteomyelitis of left ankle or foot ([place])       Cavus deformity of foot, acquired           -Patient seen and evaluated -Sutures removed but staples were left intact -Applied dry sterile dressing to surgical site left foot secured with [place] wrap and stockinet  -Advised patient to make sure to keep dressings clean, dry, and intact to left foot at surgical site -Advised patient to continue with post-op shoe on  left foot with cane -Advised patient to limit activity to tolerance time max on foot 1 hour -Advised patient to ice and elevate as necessary  -Continue with PRN meds -Will plan for possible staple removal at next office visit.  -At no additional charge mechanically debrided nails x10 without incident using a sterile nail nipper.  Landis Martins, DPM

## 2020-05-06 ENCOUNTER — Ambulatory Visit: Payer: PPO | Admitting: Sports Medicine

## 2020-05-13 ENCOUNTER — Encounter: Payer: Self-pay | Admitting: Sports Medicine

## 2020-05-13 ENCOUNTER — Ambulatory Visit (INDEPENDENT_AMBULATORY_CARE_PROVIDER_SITE_OTHER): Payer: PPO | Admitting: Sports Medicine

## 2020-05-13 ENCOUNTER — Other Ambulatory Visit: Payer: Self-pay

## 2020-05-13 DIAGNOSIS — M21962 Unspecified acquired deformity of left lower leg: Secondary | ICD-10-CM

## 2020-05-13 DIAGNOSIS — M216X9 Other acquired deformities of unspecified foot: Secondary | ICD-10-CM

## 2020-05-13 DIAGNOSIS — M86172 Other acute osteomyelitis, left ankle and foot: Secondary | ICD-10-CM

## 2020-05-13 DIAGNOSIS — L03119 Cellulitis of unspecified part of limb: Secondary | ICD-10-CM

## 2020-05-13 DIAGNOSIS — E1142 Type 2 diabetes mellitus with diabetic polyneuropathy: Secondary | ICD-10-CM

## 2020-05-13 DIAGNOSIS — L02619 Cutaneous abscess of unspecified foot: Secondary | ICD-10-CM

## 2020-05-13 DIAGNOSIS — Z9889 Other specified postprocedural states: Secondary | ICD-10-CM

## 2020-05-13 NOTE — Progress Notes (Signed)
Subjective: Samuel Hodge is a 69 y.o. male patient seen today in office for POV # 3 (DOS 04-18-20), S/P left fifth metatarsectomy. Patient denies pain at surgical site except occasional sharp shooting pain or twinges related to his neuropathy like before, denies calf pain, denies headache, chest pain, shortness of breath, nausea, vomiting, fever, or chills.  No other issues noted.   Patient Active Problem List   Diagnosis Date Noted  . Vitamin B12 deficiency 12/13/2016  . Chronic gout of multiple sites 04/04/2016  . Chronic gouty arthritis 04/04/2016  . Gastroesophageal reflux disease without esophagitis 04/04/2016  . Mixed hyperlipidemia 04/04/2016  . Multiple-type hyperlipidemia 04/04/2016  . Benign neoplasm of colon 04/03/2016  . Cardiac arrhythmia 04/03/2016  . Cardiac enlargement 04/03/2016  . Cardiomegaly 04/03/2016  . Diabetes, polyneuropathy ([place]) 04/03/2016  . Diabetic polyneuropathy ([place]) 04/03/2016  . Essential hypertension 04/03/2016  . Generalized OA 04/03/2016  . Hearing loss 04/03/2016  . High risk medication use 04/03/2016  . Hydrocele of testis 04/03/2016  . Hypogonadism in male 04/03/2016  . Obesity (BMI 30-39.9) 04/03/2016  . Peripheral neuropathy 04/03/2016  . Primary osteoarthritis involving multiple joints 04/03/2016  . RLS (restless legs syndrome) 04/03/2016  . Thoracic radiculopathy 04/03/2016  . Thoracic root lesion 04/03/2016  . Primary osteoarthritis of right knee 10/10/2015  . Restless legs syndrome 12/14/2013  . Dyspnea and respiratory abnormality 02/16/2013    Current Outpatient Medications on File Prior to Visit  Medication Sig Dispense Refill  . amLODipine (NORVASC) 5 MG tablet Take 5 mg by mouth daily.    Marland Kitchen apixaban (ELIQUIS) 2.5 MG TABS tablet Take 1 tablet (2.5 mg total) by mouth every 12 (twelve) hours. 60 tablet 0  . azithromycin (ZITHROMAX) 250 MG tablet     . clindamycin (CLEOCIN) 300 MG capsule Take 1 capsule (300 mg total) by mouth 3  (three) times daily. 30 capsule 0  . CLOBETASOL PROPIONATE E 0.05 % emollient cream     . colchicine 0.6 MG tablet Take 1 tablet (0.6 mg total) by mouth daily. 7 tablet 0  . cyclobenzaprine (FLEXERIL) 10 MG tablet Take 10 mg by mouth.    . docusate sodium (COLACE) 100 MG capsule Take 1 capsule (100 mg total) by mouth 2 (two) times daily as needed for mild constipation. 20 capsule 0  . doxycycline (VIBRA-TABS) 100 MG tablet Take 1 tablet (100 mg total) by mouth 2 (two) times daily. 20 tablet 0  . empagliflozin (JARDIANCE) 25 MG TABS tablet Jardiance 25 mg tablet    . gabapentin (NEURONTIN) 600 MG tablet Take 1,200 mg by mouth 2 (two) times daily.     . Gabapentin Enacarbil 600 MG TBCR Take 1 pill with evening meal    . glimepiride (AMARYL) 1 MG tablet Take 1 mg by mouth daily with breakfast.    . glucose blood (FREESTYLE LITE) test strip USE 1 STRIP EVERY DAY    . hydrochlorothiazide (HYDRODIURIL) 25 MG tablet Take 25 mg by mouth daily.    . Lancets (FREESTYLE) lancets CHECK BLOOD SUGAR EVERY DAY AS DIRECTED    . latanoprost (XALATAN) 0.005 % ophthalmic solution     . latanoprost (XALATAN) 0.005 % ophthalmic solution Place 0.005 drops into both eyes daily.    Marland Kitchen lisinopril (PRINIVIL,ZESTRIL) 20 MG tablet Take 30 mg by mouth daily.    . metFORMIN (GLUCOPHAGE-XR) 500 MG 24 hr tablet Take 1,000 mg by mouth 2 (two) times daily.    . ondansetron (ZOFRAN) 4 MG tablet Take 1 tablet (  4 mg total) by mouth every 6 (six) hours as needed for nausea. 20 tablet 0  . oxyCODONE (OXYCONTIN) 10 mg 12 hr tablet Take 1 tablet (10 mg total) by mouth PRO. 1 tab PO every 12 hours for 3 days, then 1 tab PO daily for 4 days 10 tablet 0  . pantoprazole (PROTONIX) 40 MG tablet Take 40 mg by mouth daily.    . promethazine (PHENERGAN) 25 MG tablet Take 1 tablet (25 mg total) by mouth every 8 (eight) hours as needed for nausea or vomiting. 30 tablet 0  . rOPINIRole (REQUIP XL) 8 MG 24 hr tablet Take 8 mg by mouth at bedtime.     . rosuvastatin (CRESTOR) 10 MG tablet Take 10 mg by mouth.    . senna (SENOKOT) 8.6 MG TABS tablet Take 2 tablets (17.2 mg total) by mouth at bedtime. 60 each 3  . sulfamethoxazole-trimethoprim (BACTRIM) 400-80 MG tablet Take 1 tablet by mouth 2 (two) times daily. 28 tablet 0  . terbinafine (LAMISIL) 250 MG tablet      Current Facility-Administered Medications on File Prior to Visit  Medication Dose Route Frequency Provider Last Rate Last Admin  . triamcinolone acetonide (KENALOG) 10 MG/ML injection 10 mg  10 mg Other Once [place], Myrlene Riera, DPM      . triamcinolone acetonide (KENALOG-40) injection 20 mg  20 mg Other Once Landis Martins, DPM        Allergies  Allergen Reactions  . Aleve [Naproxen Sodium] Hives  . Aspirin Other (See Comments)    Unsure Unknown "childhood allergy"  . Penicillins Other (See Comments)    Unsure All pt remembers is nose bleeds associated with this reaction.  Has patient had a PCN reaction causing immediate rash, facial/tongue/throat swelling, SOB or lightheadedness with hypotension: Unknown Has patient had a PCN reaction causing severe rash involving mucus membranes or skin necrosis: Unknown Has patient had a PCN reaction that required hospitalization: Yes Has patient had a PCN reaction occurring within the last 10 years: No If all of the above answers are "NO", then may proceed with Cepha  . Latex Rash    Objective: There were no vitals filed for this visit.  General: No acute distress, AAOx3  Left foot: Staples intact with no gapping or dehiscence at surgical site, mild swelling to left foot, no erythema, no warmth,no bloody drainage, no signs of infection noted, Capillary fill time <3 seconds in all digits, gross sensation present via light touch to left foot. No pain or crepitation with range of motion left foot.  No pain with calf compression  Assessment and Plan:  Problem List Items Addressed This Visit      Endocrine   Diabetic polyneuropathy  ([place])    Other Visit Diagnoses    S/P foot surgery, left    -  Primary   Cellulitis and abscess of foot, except toes       Acute osteomyelitis of left ankle or foot ([place])       Cavus deformity of foot, acquired       Ankle deformity, left          -Patient seen and evaluated -Staples removed -Applied dry sterile dressing to surgical site left foot secured with Coban wrap and stockinet  -Advised patient to make sure to keep dressings clean, dry, and intact to left foot at surgical site for this week and then next  -Advised patient to continue with post-op shoe on left foot with cane -Advised patient to ice  and elevate as necessary  -Continue with PRN meds -Will plan for xrays and transitioning out of surgical shoe next visit.  Landis Martins, DPM

## 2020-05-17 ENCOUNTER — Telehealth: Payer: Self-pay | Admitting: *Deleted

## 2020-05-17 NOTE — Telephone Encounter (Signed)
-----   Message from Landis Martins, [place] sent at 05/17/2020 10:26 AM EDT ----- Regarding: RE: Lab prior Auth Ok will you let the patient know. ----- Message ----- From: Andres Ege, RN Sent: 05/17/2020  10:21 AM EDT To: Landis Martins, DPM Subject: RE: Lab prior Auth                             Dr. Cannon Kettle, I have never had to perform a pre-cert for a lab. I would think since the lab performed and gets paid for it they would check to see if covered. But I haven't done anything concerning this. Marcy Siren ----- Message ----- From: Landis Martins, DPM Sent: 05/13/2020  10:54 PM EDT To: Andres Ege, RN Subject: Lab prior Auth                                 Hi Val Jocelyn Lamer should of shared with you the lab test that was denied because of prior auth, any updates on this? Patient was asking me about it and said he had not heard anything from our office on if the auth for the lab test I ordered for HLAB27 was approved and if there insurance was going to cover it

## 2020-05-19 NOTE — Telephone Encounter (Signed)
I informed pt of the request for reconsideration being sent in.

## 2020-05-19 NOTE — Telephone Encounter (Signed)
[place] states [employer] does require the PA for th HLAB27. I asked for the CPT code and she stated [phone] CPT. Faxed required form for request of reconsideration to [facility] for CPT [phone] and ICD.10 code M1A.09X0 with clinicals.

## 2020-05-19 NOTE — Telephone Encounter (Addendum)
[place] states claim for the CPT [phone] testing HLAB27 had been previously denied, would have to go to [employer]. I spoke with [place] Dept and she states would have to go through the Authorization Dept.  [phone], and gave Ref# [id number].

## 2020-05-19 NOTE — Telephone Encounter (Signed)
-----   Message from Landis Martins, [place] sent at 05/17/2020 10:26 AM EDT ----- Regarding: RE: Lab prior Auth Ok will you let the patient know. ----- Message ----- From: Andres Ege, RN Sent: 05/17/2020  10:21 AM EDT To: Landis Martins, DPM Subject: RE: Lab prior Auth                             Dr. Cannon Kettle, I have never had to perform a pre-cert for a lab. I would think since the lab performed and gets paid for it they would check to see if covered. But I haven't done anything concerning this. Marcy Siren ----- Message ----- From: Landis Martins, DPM Sent: 05/13/2020  10:54 PM EDT To: Andres Ege, RN Subject: Lab prior Auth                                 Hi Val Jocelyn Lamer should of shared with you the lab test that was denied because of prior auth, any updates on this? Patient was asking me about it and said he had not heard anything from our office on if the auth for the lab test I ordered for HLAB27 was approved and if there insurance was going to cover it

## 2020-05-19 NOTE — Telephone Encounter (Addendum)
[place] states if denied due to no prior authorization received it will stay denied.

## 2020-05-24 DIAGNOSIS — G4733 Obstructive sleep apnea (adult) (pediatric): Secondary | ICD-10-CM | POA: Diagnosis not present

## 2020-05-24 DIAGNOSIS — Z9989 Dependence on other enabling machines and devices: Secondary | ICD-10-CM | POA: Diagnosis not present

## 2020-05-24 DIAGNOSIS — G2581 Restless legs syndrome: Secondary | ICD-10-CM | POA: Diagnosis not present

## 2020-05-25 ENCOUNTER — Other Ambulatory Visit: Payer: Self-pay

## 2020-05-25 ENCOUNTER — Other Ambulatory Visit: Payer: Self-pay | Admitting: Sports Medicine

## 2020-05-25 ENCOUNTER — Ambulatory Visit (INDEPENDENT_AMBULATORY_CARE_PROVIDER_SITE_OTHER): Payer: PPO | Admitting: Sports Medicine

## 2020-05-25 ENCOUNTER — Encounter: Payer: Self-pay | Admitting: Sports Medicine

## 2020-05-25 ENCOUNTER — Ambulatory Visit (INDEPENDENT_AMBULATORY_CARE_PROVIDER_SITE_OTHER): Payer: PPO

## 2020-05-25 DIAGNOSIS — Z9889 Other specified postprocedural states: Secondary | ICD-10-CM

## 2020-05-25 DIAGNOSIS — M86172 Other acute osteomyelitis, left ankle and foot: Secondary | ICD-10-CM

## 2020-05-25 DIAGNOSIS — M21962 Unspecified acquired deformity of left lower leg: Secondary | ICD-10-CM

## 2020-05-25 DIAGNOSIS — L03119 Cellulitis of unspecified part of limb: Secondary | ICD-10-CM

## 2020-05-25 DIAGNOSIS — M216X9 Other acquired deformities of unspecified foot: Secondary | ICD-10-CM

## 2020-05-25 DIAGNOSIS — L02619 Cutaneous abscess of unspecified foot: Secondary | ICD-10-CM

## 2020-05-25 DIAGNOSIS — E1142 Type 2 diabetes mellitus with diabetic polyneuropathy: Secondary | ICD-10-CM

## 2020-05-25 NOTE — Progress Notes (Signed)
Subjective: Samuel Hodge is a 69 y.o. male patient seen today in office for POV #4 (DOS 04-18-20), S/P left fifth metatarsectomy. Patient feels pretty good with small twinges once in a while pain 1-2 out of 10 with some bruising and swelling on the top but otherwise doing good, denies calf pain, denies headache, chest pain, shortness of breath, nausea, vomiting, fever, or chills.  No other issues noted.   Patient Active Problem List   Diagnosis Date Noted  . Vitamin B12 deficiency 12/13/2016  . Chronic gout of multiple sites 04/04/2016  . Chronic gouty arthritis 04/04/2016  . Gastroesophageal reflux disease without esophagitis 04/04/2016  . Mixed hyperlipidemia 04/04/2016  . Multiple-type hyperlipidemia 04/04/2016  . Benign neoplasm of colon 04/03/2016  . Cardiac arrhythmia 04/03/2016  . Cardiac enlargement 04/03/2016  . Cardiomegaly 04/03/2016  . Diabetes, polyneuropathy ([place]) 04/03/2016  . Diabetic polyneuropathy ([place]) 04/03/2016  . Essential hypertension 04/03/2016  . Generalized OA 04/03/2016  . Hearing loss 04/03/2016  . High risk medication use 04/03/2016  . Hydrocele of testis 04/03/2016  . Hypogonadism in male 04/03/2016  . Obesity (BMI 30-39.9) 04/03/2016  . Peripheral neuropathy 04/03/2016  . Primary osteoarthritis involving multiple joints 04/03/2016  . RLS (restless legs syndrome) 04/03/2016  . Thoracic radiculopathy 04/03/2016  . Thoracic root lesion 04/03/2016  . Primary osteoarthritis of right knee 10/10/2015  . Restless legs syndrome 12/14/2013  . Dyspnea and respiratory abnormality 02/16/2013    Current Outpatient Medications on File Prior to Visit  Medication Sig Dispense Refill  . amLODipine (NORVASC) 5 MG tablet Take 5 mg by mouth daily.    Marland Kitchen apixaban (ELIQUIS) 2.5 MG TABS tablet Take 1 tablet (2.5 mg total) by mouth every 12 (twelve) hours. 60 tablet 0  . azithromycin (ZITHROMAX) 250 MG tablet     . clindamycin (CLEOCIN) 300 MG capsule Take 1 capsule (300  mg total) by mouth 3 (three) times daily. 30 capsule 0  . CLOBETASOL PROPIONATE E 0.05 % emollient cream     . colchicine 0.6 MG tablet Take 1 tablet (0.6 mg total) by mouth daily. 7 tablet 0  . cyclobenzaprine (FLEXERIL) 10 MG tablet Take 10 mg by mouth.    . docusate sodium (COLACE) 100 MG capsule Take 1 capsule (100 mg total) by mouth 2 (two) times daily as needed for mild constipation. 20 capsule 0  . dorzolamide-timolol (COSOPT) 22.3-6.8 MG/ML ophthalmic solution 1 drop 2 (two) times daily.    Marland Kitchen doxycycline (VIBRA-TABS) 100 MG tablet Take 1 tablet (100 mg total) by mouth 2 (two) times daily. 20 tablet 0  . empagliflozin (JARDIANCE) 25 MG TABS tablet Jardiance 25 mg tablet    . gabapentin (NEURONTIN) 600 MG tablet Take 1,200 mg by mouth 2 (two) times daily.     . Gabapentin Enacarbil 600 MG TBCR Take 1 pill with evening meal    . glimepiride (AMARYL) 1 MG tablet Take 1 mg by mouth daily with breakfast.    . glucose blood (FREESTYLE LITE) test strip USE 1 STRIP EVERY DAY    . hydrochlorothiazide (HYDRODIURIL) 25 MG tablet Take 25 mg by mouth daily.    . Lancets (FREESTYLE) lancets CHECK BLOOD SUGAR EVERY DAY AS DIRECTED    . latanoprost (XALATAN) 0.005 % ophthalmic solution     . latanoprost (XALATAN) 0.005 % ophthalmic solution Place 0.005 drops into both eyes daily.    Marland Kitchen lisinopril (PRINIVIL,ZESTRIL) 20 MG tablet Take 30 mg by mouth daily.    . metFORMIN (GLUCOPHAGE-XR) 500 MG  24 hr tablet Take 1,000 mg by mouth 2 (two) times daily.    . ondansetron (ZOFRAN) 4 MG tablet Take 1 tablet (4 mg total) by mouth every 6 (six) hours as needed for nausea. 20 tablet 0  . oxyCODONE (OXYCONTIN) 10 mg 12 hr tablet Take 1 tablet (10 mg total) by mouth PRO. 1 tab PO every 12 hours for 3 days, then 1 tab PO daily for 4 days 10 tablet 0  . pantoprazole (PROTONIX) 40 MG tablet Take 40 mg by mouth daily.    . promethazine (PHENERGAN) 25 MG tablet Take 1 tablet (25 mg total) by mouth every 8 (eight) hours as  needed for nausea or vomiting. 30 tablet 0  . rOPINIRole (REQUIP XL) 8 MG 24 hr tablet Take 8 mg by mouth at bedtime.    . rosuvastatin (CRESTOR) 10 MG tablet Take 10 mg by mouth.    . senna (SENOKOT) 8.6 MG TABS tablet Take 2 tablets (17.2 mg total) by mouth at bedtime. 60 each 3  . sulfamethoxazole-trimethoprim (BACTRIM) 400-80 MG tablet Take 1 tablet by mouth 2 (two) times daily. 28 tablet 0  . terbinafine (LAMISIL) 250 MG tablet      Current Facility-Administered Medications on File Prior to Visit  Medication Dose Route Frequency Provider Last Rate Last Admin  . triamcinolone acetonide (KENALOG) 10 MG/ML injection 10 mg  10 mg Other Once [place], [place], DPM      . triamcinolone acetonide (KENALOG-40) injection 20 mg  20 mg Other Once Landis Martins, DPM        Allergies  Allergen Reactions  . Aleve [Naproxen Sodium] Hives  . Aspirin Other (See Comments)    Unsure Unknown "childhood allergy"  . Penicillins Other (See Comments)    Unsure All pt remembers is nose bleeds associated with this reaction.  Has patient had a PCN reaction causing immediate rash, facial/tongue/throat swelling, SOB or lightheadedness with hypotension: Unknown Has patient had a PCN reaction causing severe rash involving mucus membranes or skin necrosis: Unknown Has patient had a PCN reaction that required hospitalization: Yes Has patient had a PCN reaction occurring within the last 10 years: No If all of the above answers are "NO", then may proceed with Cepha  . Latex Rash    Objective: There were no vitals filed for this visit.  General: No acute distress, AAOx3  Left foot: Surgical incision intact with no gapping or dehiscence at surgical site, mild swelling to left foot, no erythema, no warmth,no bloody drainage, no signs of infection noted, Capillary fill time <3 seconds in all digits, gross sensation present via light touch to left foot. No pain or crepitation with range of motion left foot.  No pain  with calf compression  X-rays consistent with postoperative status  Assessment and Plan:  Problem List Items Addressed This Visit      Endocrine   Diabetic polyneuropathy ([place])    Other Visit Diagnoses    S/P foot surgery, left    -  Primary   Cellulitis and abscess of foot, except toes       Acute osteomyelitis of left ankle or foot ([place])       Cavus deformity of foot, acquired       Ankle deformity, left          -Patient seen and evaluated -X-rays reviewed -Applied Steri-Strips and stockinet -Patient may shower as normal -Patient may transition to using a normal tennis shoe with lace up ASO brace -Advised patient to  ice and elevate as necessary  -Continue with PRN meds if needed -Will plan for transitioning patient to his new diabetic shoes and custom brace at next visit if he is doing well versus considering physical therapy  Landis Martins, DPM

## 2020-05-27 ENCOUNTER — Other Ambulatory Visit: Payer: Self-pay | Admitting: Sports Medicine

## 2020-05-27 DIAGNOSIS — Z9889 Other specified postprocedural states: Secondary | ICD-10-CM

## 2020-06-08 ENCOUNTER — Other Ambulatory Visit: Payer: Self-pay

## 2020-06-08 ENCOUNTER — Ambulatory Visit (INDEPENDENT_AMBULATORY_CARE_PROVIDER_SITE_OTHER): Payer: PPO | Admitting: Sports Medicine

## 2020-06-08 ENCOUNTER — Encounter: Payer: Self-pay | Admitting: Sports Medicine

## 2020-06-08 DIAGNOSIS — Z9889 Other specified postprocedural states: Secondary | ICD-10-CM

## 2020-06-08 DIAGNOSIS — M21962 Unspecified acquired deformity of left lower leg: Secondary | ICD-10-CM

## 2020-06-08 DIAGNOSIS — E1142 Type 2 diabetes mellitus with diabetic polyneuropathy: Secondary | ICD-10-CM

## 2020-06-08 DIAGNOSIS — M216X9 Other acquired deformities of unspecified foot: Secondary | ICD-10-CM

## 2020-06-08 DIAGNOSIS — L03119 Cellulitis of unspecified part of limb: Secondary | ICD-10-CM

## 2020-06-08 DIAGNOSIS — L02619 Cutaneous abscess of unspecified foot: Secondary | ICD-10-CM

## 2020-06-08 DIAGNOSIS — M86172 Other acute osteomyelitis, left ankle and foot: Secondary | ICD-10-CM

## 2020-06-08 NOTE — Progress Notes (Signed)
Subjective: Samuel Hodge is a 69 y.o. male patient seen today in office for POV # 5 (DOS 04-18-20), S/P left fifth metatarsectomy. Patient reports that he has been doing good no more pain and the only issue now is his ankle with a little swelling has been using his brace tennis shoe and cane but also brought his diabetic shoes to see if he can start wearing those, denies calf pain, denies headache, chest pain, shortness of breath, nausea, vomiting, fever, or chills.  No other issues noted.   Fasting blood sugar 144 A1c 8 and patient reports that Dr. Doran Durand want to do his ankle surgery to his A1c is closer to 7  Patient Active Problem List   Diagnosis Date Noted  . Vitamin B12 deficiency 12/13/2016  . Chronic gout of multiple sites 04/04/2016  . Chronic gouty arthritis 04/04/2016  . Gastroesophageal reflux disease without esophagitis 04/04/2016  . Mixed hyperlipidemia 04/04/2016  . Multiple-type hyperlipidemia 04/04/2016  . Benign neoplasm of colon 04/03/2016  . Cardiac arrhythmia 04/03/2016  . Cardiac enlargement 04/03/2016  . Cardiomegaly 04/03/2016  . Diabetes, polyneuropathy ([place]) 04/03/2016  . Diabetic polyneuropathy ([place]) 04/03/2016  . Essential hypertension 04/03/2016  . Generalized OA 04/03/2016  . Hearing loss 04/03/2016  . High risk medication use 04/03/2016  . Hydrocele of testis 04/03/2016  . Hypogonadism in male 04/03/2016  . Obesity (BMI 30-39.9) 04/03/2016  . Peripheral neuropathy 04/03/2016  . Primary osteoarthritis involving multiple joints 04/03/2016  . RLS (restless legs syndrome) 04/03/2016  . Thoracic radiculopathy 04/03/2016  . Thoracic root lesion 04/03/2016  . Primary osteoarthritis of right knee 10/10/2015  . Restless legs syndrome 12/14/2013  . Dyspnea and respiratory abnormality 02/16/2013    Current Outpatient Medications on File Prior to Visit  Medication Sig Dispense Refill  . amLODipine (NORVASC) 5 MG tablet Take 5 mg by mouth daily.    Marland Kitchen  apixaban (ELIQUIS) 2.5 MG TABS tablet Take 1 tablet (2.5 mg total) by mouth every 12 (twelve) hours. 60 tablet 0  . azithromycin (ZITHROMAX) 250 MG tablet     . clindamycin (CLEOCIN) 300 MG capsule Take 1 capsule (300 mg total) by mouth 3 (three) times daily. 30 capsule 0  . CLOBETASOL PROPIONATE E 0.05 % emollient cream     . colchicine 0.6 MG tablet Take 1 tablet (0.6 mg total) by mouth daily. 7 tablet 0  . cyclobenzaprine (FLEXERIL) 10 MG tablet Take 10 mg by mouth.    . docusate sodium (COLACE) 100 MG capsule Take 1 capsule (100 mg total) by mouth 2 (two) times daily as needed for mild constipation. 20 capsule 0  . dorzolamide-timolol (COSOPT) 22.3-6.8 MG/ML ophthalmic solution 1 drop 2 (two) times daily.    Marland Kitchen doxycycline (VIBRA-TABS) 100 MG tablet Take 1 tablet (100 mg total) by mouth 2 (two) times daily. 20 tablet 0  . empagliflozin (JARDIANCE) 25 MG TABS tablet Jardiance 25 mg tablet    . gabapentin (NEURONTIN) 600 MG tablet Take 1,200 mg by mouth 2 (two) times daily.     . Gabapentin Enacarbil 600 MG TBCR Take 1 pill with evening meal    . glimepiride (AMARYL) 1 MG tablet Take 1 mg by mouth daily with breakfast.    . glucose blood (FREESTYLE LITE) test strip USE 1 STRIP EVERY DAY    . hydrochlorothiazide (HYDRODIURIL) 25 MG tablet Take 25 mg by mouth daily.    . Lancets (FREESTYLE) lancets CHECK BLOOD SUGAR EVERY DAY AS DIRECTED    . latanoprost (XALATAN)  0.005 % ophthalmic solution     . latanoprost (XALATAN) 0.005 % ophthalmic solution Place 0.005 drops into both eyes daily.    Marland Kitchen lisinopril (PRINIVIL,ZESTRIL) 20 MG tablet Take 30 mg by mouth daily.    . metFORMIN (GLUCOPHAGE-XR) 500 MG 24 hr tablet Take 1,000 mg by mouth 2 (two) times daily.    . ondansetron (ZOFRAN) 4 MG tablet Take 1 tablet (4 mg total) by mouth every 6 (six) hours as needed for nausea. 20 tablet 0  . oxyCODONE (OXYCONTIN) 10 mg 12 hr tablet Take 1 tablet (10 mg total) by mouth PRO. 1 tab PO every 12 hours for 3  days, then 1 tab PO daily for 4 days 10 tablet 0  . pantoprazole (PROTONIX) 40 MG tablet Take 40 mg by mouth daily.    . promethazine (PHENERGAN) 25 MG tablet Take 1 tablet (25 mg total) by mouth every 8 (eight) hours as needed for nausea or vomiting. 30 tablet 0  . rOPINIRole (REQUIP XL) 8 MG 24 hr tablet Take 8 mg by mouth at bedtime.    . rosuvastatin (CRESTOR) 10 MG tablet Take 10 mg by mouth.    . senna (SENOKOT) 8.6 MG TABS tablet Take 2 tablets (17.2 mg total) by mouth at bedtime. 60 each 3  . sulfamethoxazole-trimethoprim (BACTRIM) 400-80 MG tablet Take 1 tablet by mouth 2 (two) times daily. 28 tablet 0  . terbinafine (LAMISIL) 250 MG tablet      Current Facility-Administered Medications on File Prior to Visit  Medication Dose Route Frequency Provider Last Rate Last Admin  . triamcinolone acetonide (KENALOG) 10 MG/ML injection 10 mg  10 mg Other Once [place], Brynlee [place]well, DPM      . triamcinolone acetonide (KENALOG-40) injection 20 mg  20 mg Other Once Landis Martins, DPM        Allergies  Allergen Reactions  . Aleve [Naproxen Sodium] Hives  . Aspirin Other (See Comments)    Unsure Unknown "childhood allergy"  . Penicillins Other (See Comments)    Unsure All pt remembers is nose bleeds associated with this reaction.  Has patient had a PCN reaction causing immediate rash, facial/tongue/throat swelling, SOB or lightheadedness with hypotension: Unknown Has patient had a PCN reaction causing severe rash involving mucus membranes or skin necrosis: Unknown Has patient had a PCN reaction that required hospitalization: Yes Has patient had a PCN reaction occurring within the last 10 years: No If all of the above answers are "NO", then may proceed with Cepha  . Latex Rash    Objective: There were no vitals filed for this visit.  General: No acute distress, AAOx3  Left foot: Surgical incision well-healed, minimal swelling at surgical site, no erythema, no warmth,no bloody drainage, no  signs of infection noted, Capillary fill time <3 seconds in all digits, gross sensation present via light touch to left foot. No pain or crepitation with range of motion left foot.  Severe fixed ankle deformity with inverted position of foot.  No pain with calf compression  Gait nonantalgic with severe ankle deformity on the left  Assessment and Plan:  Problem List Items Addressed This Visit      Endocrine   Diabetic polyneuropathy ([place])    Other Visit Diagnoses    S/P foot surgery, left    -  Primary   Cellulitis and abscess of foot, except toes       Acute osteomyelitis of left ankle or foot ([place])       Cavus deformity of foot,  acquired       Ankle deformity, left          -Patient seen and evaluated -Surgical site well healed -May continue with tennis shoe and ankle brace and may now slowly using diabetic shoes/high top boots with Kasandra Knudsen -Patient to see Liliane Channel or Benjie Karvonen to see if his more rigid brace needs adjustments -Return as scheduled or sooner if problems arise.  Landis Martins, DPM

## 2020-06-09 DIAGNOSIS — G4733 Obstructive sleep apnea (adult) (pediatric): Secondary | ICD-10-CM | POA: Diagnosis not present

## 2020-06-28 ENCOUNTER — Other Ambulatory Visit: Payer: Self-pay

## 2020-06-28 ENCOUNTER — Other Ambulatory Visit: Payer: PPO | Admitting: Orthotics

## 2020-07-11 DIAGNOSIS — I10 Essential (primary) hypertension: Secondary | ICD-10-CM | POA: Diagnosis not present

## 2020-07-11 DIAGNOSIS — I517 Cardiomegaly: Secondary | ICD-10-CM | POA: Diagnosis not present

## 2020-07-11 DIAGNOSIS — M1A09X0 Idiopathic chronic gout, multiple sites, without tophus (tophi): Secondary | ICD-10-CM | POA: Diagnosis not present

## 2020-07-11 DIAGNOSIS — M8949 Other hypertrophic osteoarthropathy, multiple sites: Secondary | ICD-10-CM | POA: Diagnosis not present

## 2020-07-11 DIAGNOSIS — Z125 Encounter for screening for malignant neoplasm of prostate: Secondary | ICD-10-CM | POA: Diagnosis not present

## 2020-07-11 DIAGNOSIS — E782 Mixed hyperlipidemia: Secondary | ICD-10-CM | POA: Diagnosis not present

## 2020-07-11 DIAGNOSIS — Z0000 Encounter for general adult medical examination without abnormal findings: Secondary | ICD-10-CM | POA: Diagnosis not present

## 2020-07-11 DIAGNOSIS — I119 Hypertensive heart disease without heart failure: Secondary | ICD-10-CM | POA: Diagnosis not present

## 2020-07-11 DIAGNOSIS — G2581 Restless legs syndrome: Secondary | ICD-10-CM | POA: Diagnosis not present

## 2020-07-11 DIAGNOSIS — Z6830 Body mass index (BMI) 30.0-30.9, adult: Secondary | ICD-10-CM | POA: Diagnosis not present

## 2020-07-11 DIAGNOSIS — E6609 Other obesity due to excess calories: Secondary | ICD-10-CM | POA: Diagnosis not present

## 2020-07-11 DIAGNOSIS — E538 Deficiency of other specified B group vitamins: Secondary | ICD-10-CM | POA: Diagnosis not present

## 2020-07-11 DIAGNOSIS — E611 Iron deficiency: Secondary | ICD-10-CM | POA: Diagnosis not present

## 2020-07-11 DIAGNOSIS — E1142 Type 2 diabetes mellitus with diabetic polyneuropathy: Secondary | ICD-10-CM | POA: Diagnosis not present

## 2020-07-11 DIAGNOSIS — K219 Gastro-esophageal reflux disease without esophagitis: Secondary | ICD-10-CM | POA: Diagnosis not present

## 2020-07-26 ENCOUNTER — Other Ambulatory Visit: Payer: Self-pay

## 2020-07-26 ENCOUNTER — Other Ambulatory Visit: Payer: PPO | Admitting: Orthotics

## 2020-07-26 DIAGNOSIS — E1142 Type 2 diabetes mellitus with diabetic polyneuropathy: Secondary | ICD-10-CM | POA: Diagnosis not present

## 2020-07-26 DIAGNOSIS — M216X2 Other acquired deformities of left foot: Secondary | ICD-10-CM | POA: Diagnosis not present

## 2020-07-26 DIAGNOSIS — M21962 Unspecified acquired deformity of left lower leg: Secondary | ICD-10-CM | POA: Diagnosis not present

## 2020-07-27 ENCOUNTER — Telehealth: Payer: Self-pay | Admitting: Sports Medicine

## 2020-07-27 NOTE — Telephone Encounter (Signed)
Received a call from [email or web address] stating they are needing a written order(RX) for pts brace that we are getting authorization for. [id number]/[id number] left foot please. When faxing ref # is [phone].Marland KitchenMarland Kitchen

## 2020-07-27 NOTE — Telephone Encounter (Signed)
Do they just want a script (like [facility]) to be faxed to them?

## 2020-07-27 NOTE — Telephone Encounter (Signed)
Samuel Hodge you can bring a Rx to me on tomorrow and I will sign it for [place] to send to [facility]

## 2020-07-28 DIAGNOSIS — Z23 Encounter for immunization: Secondary | ICD-10-CM | POA: Diagnosis not present

## 2020-08-05 ENCOUNTER — Ambulatory Visit: Payer: PPO | Admitting: Orthotics

## 2020-08-05 ENCOUNTER — Other Ambulatory Visit: Payer: Self-pay

## 2020-08-05 DIAGNOSIS — L02619 Cutaneous abscess of unspecified foot: Secondary | ICD-10-CM

## 2020-08-05 DIAGNOSIS — E1142 Type 2 diabetes mellitus with diabetic polyneuropathy: Secondary | ICD-10-CM

## 2020-08-05 DIAGNOSIS — M216X9 Other acquired deformities of unspecified foot: Secondary | ICD-10-CM

## 2020-08-05 NOTE — Progress Notes (Signed)
Sending brace back to [facility] to be trimmed down 3/8 from distal end; foot bed be lined with zote.

## 2020-08-30 ENCOUNTER — Ambulatory Visit (INDEPENDENT_AMBULATORY_CARE_PROVIDER_SITE_OTHER): Payer: PPO | Admitting: Sports Medicine

## 2020-08-30 ENCOUNTER — Encounter: Payer: Self-pay | Admitting: Sports Medicine

## 2020-08-30 ENCOUNTER — Other Ambulatory Visit: Payer: Self-pay

## 2020-08-30 DIAGNOSIS — M79609 Pain in unspecified limb: Secondary | ICD-10-CM

## 2020-08-30 DIAGNOSIS — E1142 Type 2 diabetes mellitus with diabetic polyneuropathy: Secondary | ICD-10-CM

## 2020-08-30 DIAGNOSIS — B351 Tinea unguium: Secondary | ICD-10-CM

## 2020-08-30 DIAGNOSIS — M21962 Unspecified acquired deformity of left lower leg: Secondary | ICD-10-CM

## 2020-08-30 NOTE — Progress Notes (Signed)
Subjective: Samuel Hodge is a 69 y.o. male patient seen today in office for diabetic nail trim. Patient reports that his left foot is doing good surgical site well-healed.  Fasting blood sugar ranges 130-1 40 A1c 8 like previous  Patient Active Problem List   Diagnosis Date Noted  . Arthritis of foot 01/14/2020  . Pressure injury of left foot, unstageable ([place]) 01/14/2020  . Osteoarthritis of wrist 07/16/2019  . Iron deficiency 07/08/2019  . Acquired cavovarus deformity of foot 04/27/2019  . History of total knee arthroplasty 11/21/2018  . OSA (obstructive sleep apnea) 02/24/2018  . Left hamstring muscle strain 06/28/2017  . Vitamin B12 deficiency 12/13/2016  . Chronic gout of multiple sites 04/04/2016  . Chronic gouty arthritis 04/04/2016  . Gastroesophageal reflux disease without esophagitis 04/04/2016  . Mixed hyperlipidemia 04/04/2016  . Multiple-type hyperlipidemia 04/04/2016  . Benign neoplasm of colon 04/03/2016  . Cardiac arrhythmia 04/03/2016  . Cardiac enlargement 04/03/2016  . Cardiomegaly 04/03/2016  . Diabetes, polyneuropathy ([place]) 04/03/2016  . Diabetic polyneuropathy ([place]) 04/03/2016  . Essential hypertension 04/03/2016  . Generalized OA 04/03/2016  . Hearing loss 04/03/2016  . High risk medication use 04/03/2016  . Hydrocele of testis 04/03/2016  . Hypogonadism in male 04/03/2016  . Obesity (BMI 30-39.9) 04/03/2016  . Peripheral neuropathy 04/03/2016  . Primary osteoarthritis involving multiple joints 04/03/2016  . RLS (restless legs syndrome) 04/03/2016  . Thoracic radiculopathy 04/03/2016  . Thoracic root lesion 04/03/2016  . Primary osteoarthritis of right knee 10/10/2015  . Restless legs syndrome 12/14/2013  . Dyspnea and respiratory abnormality 02/16/2013    Current Outpatient Medications on File Prior to Visit  Medication Sig Dispense Refill  . amLODipine (NORVASC) 5 MG tablet Take 5 mg by mouth daily.    Marland Kitchen apixaban (ELIQUIS) 2.5 MG TABS tablet  Take 1 tablet (2.5 mg total) by mouth every 12 (twelve) hours. 60 tablet 0  . azithromycin (ZITHROMAX) 250 MG tablet     . clindamycin (CLEOCIN) 300 MG capsule Take 1 capsule (300 mg total) by mouth 3 (three) times daily. 30 capsule 0  . CLOBETASOL PROPIONATE E 0.05 % emollient cream     . colchicine 0.6 MG tablet Take 1 tablet (0.6 mg total) by mouth daily. 7 tablet 0  . cyclobenzaprine (FLEXERIL) 10 MG tablet Take 10 mg by mouth.    . docusate sodium (COLACE) 100 MG capsule Take 1 capsule (100 mg total) by mouth 2 (two) times daily as needed for mild constipation. 20 capsule 0  . dorzolamide-timolol (COSOPT) 22.3-6.8 MG/ML ophthalmic solution 1 drop 2 (two) times daily.    Marland Kitchen doxycycline (VIBRA-TABS) 100 MG tablet Take 1 tablet (100 mg total) by mouth 2 (two) times daily. 20 tablet 0  . empagliflozin (JARDIANCE) 25 MG TABS tablet Jardiance 25 mg tablet    . gabapentin (NEURONTIN) 600 MG tablet Take 1,200 mg by mouth 2 (two) times daily.     . Gabapentin Enacarbil 600 MG TBCR Take 1 pill with evening meal    . glimepiride (AMARYL) 1 MG tablet Take 1 mg by mouth daily with breakfast.    . glucose blood (FREESTYLE LITE) test strip USE 1 STRIP EVERY DAY    . hydrochlorothiazide (HYDRODIURIL) 25 MG tablet Take 25 mg by mouth daily.    . Lancets (FREESTYLE) lancets CHECK BLOOD SUGAR EVERY DAY AS DIRECTED    . latanoprost (XALATAN) 0.005 % ophthalmic solution     . latanoprost (XALATAN) 0.005 % ophthalmic solution Place 0.005 drops into  both eyes daily.    Marland Kitchen lisinopril (PRINIVIL,ZESTRIL) 20 MG tablet Take 30 mg by mouth daily.    . metFORMIN (GLUCOPHAGE-XR) 500 MG 24 hr tablet Take 1,000 mg by mouth 2 (two) times daily.    . ondansetron (ZOFRAN) 4 MG tablet Take 1 tablet (4 mg total) by mouth every 6 (six) hours as needed for nausea. 20 tablet 0  . oxyCODONE (OXYCONTIN) 10 mg 12 hr tablet Take 1 tablet (10 mg total) by mouth PRO. 1 tab PO every 12 hours for 3 days, then 1 tab PO daily for 4 days 10  tablet 0  . pantoprazole (PROTONIX) 40 MG tablet Take 40 mg by mouth daily.    . promethazine (PHENERGAN) 25 MG tablet Take 1 tablet (25 mg total) by mouth every 8 (eight) hours as needed for nausea or vomiting. 30 tablet 0  . rOPINIRole (REQUIP XL) 8 MG 24 hr tablet Take 8 mg by mouth at bedtime.    . rosuvastatin (CRESTOR) 10 MG tablet Take 10 mg by mouth.    . senna (SENOKOT) 8.6 MG TABS tablet Take 2 tablets (17.2 mg total) by mouth at bedtime. 60 each 3  . sulfamethoxazole-trimethoprim (BACTRIM) 400-80 MG tablet Take 1 tablet by mouth 2 (two) times daily. 28 tablet 0  . terbinafine (LAMISIL) 250 MG tablet      Current Facility-Administered Medications on File Prior to Visit  Medication Dose Route Frequency Provider Last Rate Last Admin  . triamcinolone acetonide (KENALOG) 10 MG/ML injection 10 mg  10 mg Other Once [place], Trung Wenzl, DPM      . triamcinolone acetonide (KENALOG-40) injection 20 mg  20 mg Other Once Landis Martins, DPM        Allergies  Allergen Reactions  . Aleve [Naproxen Sodium] Hives  . Aspirin Other (See Comments)    Unsure Unknown "childhood allergy"  . Penicillins Other (See Comments)    Unsure All pt remembers is nose bleeds associated with this reaction.  Has patient had a PCN reaction causing immediate rash, facial/tongue/throat swelling, SOB or lightheadedness with hypotension: Unknown Has patient had a PCN reaction causing severe rash involving mucus membranes or skin necrosis: Unknown Has patient had a PCN reaction that required hospitalization: Yes Has patient had a PCN reaction occurring within the last 10 years: No If all of the above answers are "NO", then may proceed with Cepha  . Latex Rash    Objective: There were no vitals filed for this visit.  General: No acute distress, AAOx3  Dermatology: Surgical site well-healed nails x10 mildly elongated and thickened consistent with onychomycosis  Neurovascular status intact  Musculoskeletal  nonantalgic gait with severe ankle deformity on the left  Assessment and Plan:  Problem List Items Addressed This Visit      Endocrine   Diabetic polyneuropathy ([place])    Other Visit Diagnoses    Pain due to onychomycosis of nail    -  Primary   Ankle deformity, left          -Patient seen and evaluated -Surgical site well healed -Mechanically using a sterile incident -May continue with tennis shoe and ankle brace until his custom brace has arrived may ambulate with cane for additional support and offloading -Return as scheduled in 10 weeks for routine nail care or sooner if problems arise.  Landis Martins, DPM

## 2020-09-07 DIAGNOSIS — G4733 Obstructive sleep apnea (adult) (pediatric): Secondary | ICD-10-CM | POA: Diagnosis not present

## 2020-09-13 DIAGNOSIS — L814 Other melanin hyperpigmentation: Secondary | ICD-10-CM | POA: Diagnosis not present

## 2020-09-13 DIAGNOSIS — L570 Actinic keratosis: Secondary | ICD-10-CM | POA: Diagnosis not present

## 2020-09-13 DIAGNOSIS — L821 Other seborrheic keratosis: Secondary | ICD-10-CM | POA: Diagnosis not present

## 2020-09-13 DIAGNOSIS — D1801 Hemangioma of skin and subcutaneous tissue: Secondary | ICD-10-CM | POA: Diagnosis not present

## 2020-09-16 ENCOUNTER — Telehealth: Payer: Self-pay | Admitting: Orthotics

## 2020-09-16 NOTE — Telephone Encounter (Signed)
Returned call concerning being "invoiced for a brace he cannot use";  Right now his brace is in [place] for modifications, and I advised (left message) to call Dawn next week to see if she can re-submit prior auth for [employer] to get claim paid.

## 2020-09-20 ENCOUNTER — Telehealth: Payer: Self-pay | Admitting: Sports Medicine

## 2020-09-20 NOTE — Telephone Encounter (Signed)
Pt left message for a call back to discuss bill for brace he got.  I returned call and pt stated brace does not work for him and he came in to see Liliane Channel in [place] with his old brace and Liliane Channel took it to send it out for repair. I told pt I would have to discuss brace with Liliane Channel and see what he recommends.I did explain that the braces are custom made and cannot be returned. And we did get authorization from [employer] and the balance due is now 130.37. I explained that we may still have to charge for the old brace to be refurbished.  I discussed with Liliane Channel and called pt and left message that Liliane Channel did not realize pt had 2 braces and has sent the old one for repair/refurbishing. Per Liliane Channel we should wait until the old brace comes back in and go from there as far as charges go for the new brace.

## 2020-09-22 DIAGNOSIS — M5451 Vertebrogenic low back pain: Secondary | ICD-10-CM | POA: Diagnosis not present

## 2020-09-22 DIAGNOSIS — M9902 Segmental and somatic dysfunction of thoracic region: Secondary | ICD-10-CM | POA: Diagnosis not present

## 2020-09-22 DIAGNOSIS — M9903 Segmental and somatic dysfunction of lumbar region: Secondary | ICD-10-CM | POA: Diagnosis not present

## 2020-09-22 DIAGNOSIS — M9905 Segmental and somatic dysfunction of pelvic region: Secondary | ICD-10-CM | POA: Diagnosis not present

## 2020-09-26 DIAGNOSIS — M9902 Segmental and somatic dysfunction of thoracic region: Secondary | ICD-10-CM | POA: Diagnosis not present

## 2020-09-26 DIAGNOSIS — M9905 Segmental and somatic dysfunction of pelvic region: Secondary | ICD-10-CM | POA: Diagnosis not present

## 2020-09-26 DIAGNOSIS — M542 Cervicalgia: Secondary | ICD-10-CM | POA: Diagnosis not present

## 2020-09-26 DIAGNOSIS — M9901 Segmental and somatic dysfunction of cervical region: Secondary | ICD-10-CM | POA: Diagnosis not present

## 2020-09-28 DIAGNOSIS — M9905 Segmental and somatic dysfunction of pelvic region: Secondary | ICD-10-CM | POA: Diagnosis not present

## 2020-09-28 DIAGNOSIS — M9901 Segmental and somatic dysfunction of cervical region: Secondary | ICD-10-CM | POA: Diagnosis not present

## 2020-09-28 DIAGNOSIS — M542 Cervicalgia: Secondary | ICD-10-CM | POA: Diagnosis not present

## 2020-09-28 DIAGNOSIS — M9902 Segmental and somatic dysfunction of thoracic region: Secondary | ICD-10-CM | POA: Diagnosis not present

## 2020-10-05 DIAGNOSIS — M9901 Segmental and somatic dysfunction of cervical region: Secondary | ICD-10-CM | POA: Diagnosis not present

## 2020-10-05 DIAGNOSIS — M9905 Segmental and somatic dysfunction of pelvic region: Secondary | ICD-10-CM | POA: Diagnosis not present

## 2020-10-05 DIAGNOSIS — M9902 Segmental and somatic dysfunction of thoracic region: Secondary | ICD-10-CM | POA: Diagnosis not present

## 2020-10-05 DIAGNOSIS — M542 Cervicalgia: Secondary | ICD-10-CM | POA: Diagnosis not present

## 2020-10-18 DIAGNOSIS — M542 Cervicalgia: Secondary | ICD-10-CM | POA: Diagnosis not present

## 2020-10-18 DIAGNOSIS — M9905 Segmental and somatic dysfunction of pelvic region: Secondary | ICD-10-CM | POA: Diagnosis not present

## 2020-10-18 DIAGNOSIS — M9902 Segmental and somatic dysfunction of thoracic region: Secondary | ICD-10-CM | POA: Diagnosis not present

## 2020-10-18 DIAGNOSIS — M9901 Segmental and somatic dysfunction of cervical region: Secondary | ICD-10-CM | POA: Diagnosis not present

## 2020-10-19 ENCOUNTER — Other Ambulatory Visit: Payer: PPO | Admitting: Orthotics

## 2020-10-21 ENCOUNTER — Ambulatory Visit (INDEPENDENT_AMBULATORY_CARE_PROVIDER_SITE_OTHER): Payer: PPO | Admitting: Orthotics

## 2020-10-21 ENCOUNTER — Other Ambulatory Visit: Payer: Self-pay

## 2020-10-21 DIAGNOSIS — M21962 Unspecified acquired deformity of left lower leg: Secondary | ICD-10-CM

## 2020-10-21 DIAGNOSIS — L03119 Cellulitis of unspecified part of limb: Secondary | ICD-10-CM

## 2020-10-21 DIAGNOSIS — E1142 Type 2 diabetes mellitus with diabetic polyneuropathy: Secondary | ICD-10-CM

## 2020-10-21 DIAGNOSIS — L02619 Cutaneous abscess of unspecified foot: Secondary | ICD-10-CM

## 2020-10-21 NOTE — Progress Notes (Signed)
Patient picked up remodifiied brace; seemed please with the result.  However going to order him a shoe to go with it (gratis);  Patient was also concerned about paying 20% co pay; but I talked to him about financial obligation and it was a Medicare requirement.

## 2020-11-15 ENCOUNTER — Ambulatory Visit: Payer: PPO | Admitting: Sports Medicine

## 2020-11-18 ENCOUNTER — Other Ambulatory Visit: Payer: Self-pay

## 2020-11-18 ENCOUNTER — Ambulatory Visit (INDEPENDENT_AMBULATORY_CARE_PROVIDER_SITE_OTHER): Payer: PPO | Admitting: Sports Medicine

## 2020-11-18 ENCOUNTER — Encounter: Payer: Self-pay | Admitting: Sports Medicine

## 2020-11-18 DIAGNOSIS — E1142 Type 2 diabetes mellitus with diabetic polyneuropathy: Secondary | ICD-10-CM

## 2020-11-18 DIAGNOSIS — B351 Tinea unguium: Secondary | ICD-10-CM

## 2020-11-18 DIAGNOSIS — M216X9 Other acquired deformities of unspecified foot: Secondary | ICD-10-CM

## 2020-11-18 DIAGNOSIS — M21962 Unspecified acquired deformity of left lower leg: Secondary | ICD-10-CM

## 2020-11-18 DIAGNOSIS — M79609 Pain in unspecified limb: Secondary | ICD-10-CM

## 2020-11-18 NOTE — Progress Notes (Signed)
Subjective: Samuel Hodge is a 70 y.o. male patient seen today in office for diabetic nail trim. Patient reports that his left ankle is still bothersome wearing brace, states that it helps but is wondering if his new shoes are in.  No other pedal complaints noted.  Fasting blood sugar ranges 177 A1c 8 like previous  Last PCP August   Patient Active Problem List   Diagnosis Date Noted  . Arthritis of foot 01/14/2020  . Pressure injury of left foot, unstageable ([place]) 01/14/2020  . Osteoarthritis of wrist 07/16/2019  . Iron deficiency 07/08/2019  . Acquired cavovarus deformity of foot 04/27/2019  . History of total knee arthroplasty 11/21/2018  . OSA (obstructive sleep apnea) 02/24/2018  . Left hamstring muscle strain 06/28/2017  . Vitamin B12 deficiency 12/13/2016  . Chronic gout of multiple sites 04/04/2016  . Chronic gouty arthritis 04/04/2016  . Gastroesophageal reflux disease without esophagitis 04/04/2016  . Mixed hyperlipidemia 04/04/2016  . Multiple-type hyperlipidemia 04/04/2016  . Benign neoplasm of colon 04/03/2016  . Cardiac arrhythmia 04/03/2016  . Cardiac enlargement 04/03/2016  . Cardiomegaly 04/03/2016  . Diabetes, polyneuropathy ([place]) 04/03/2016  . Diabetic polyneuropathy ([place]) 04/03/2016  . Essential hypertension 04/03/2016  . Generalized OA 04/03/2016  . Hearing loss 04/03/2016  . High risk medication use 04/03/2016  . Hydrocele of testis 04/03/2016  . Hypogonadism in male 04/03/2016  . Obesity (BMI 30-39.9) 04/03/2016  . Peripheral neuropathy 04/03/2016  . Primary osteoarthritis involving multiple joints 04/03/2016  . RLS (restless legs syndrome) 04/03/2016  . Thoracic radiculopathy 04/03/2016  . Thoracic root lesion 04/03/2016  . Primary osteoarthritis of right knee 10/10/2015  . Restless legs syndrome 12/14/2013  . Dyspnea and respiratory abnormality 02/16/2013    Current Outpatient Medications on File Prior to Visit  Medication Sig Dispense Refill   . amLODipine (NORVASC) 5 MG tablet Take 5 mg by mouth daily.    Marland Kitchen apixaban (ELIQUIS) 2.5 MG TABS tablet Take 1 tablet (2.5 mg total) by mouth every 12 (twelve) hours. 60 tablet 0  . azithromycin (ZITHROMAX) 250 MG tablet     . clindamycin (CLEOCIN) 300 MG capsule Take 1 capsule (300 mg total) by mouth 3 (three) times daily. 30 capsule 0  . CLOBETASOL PROPIONATE E 0.05 % emollient cream     . colchicine 0.6 MG tablet Take 1 tablet (0.6 mg total) by mouth daily. 7 tablet 0  . cyclobenzaprine (FLEXERIL) 10 MG tablet Take 10 mg by mouth.    . docusate sodium (COLACE) 100 MG capsule Take 1 capsule (100 mg total) by mouth 2 (two) times daily as needed for mild constipation. 20 capsule 0  . dorzolamide-timolol (COSOPT) 22.3-6.8 MG/ML ophthalmic solution 1 drop 2 (two) times daily.    Marland Kitchen doxycycline (VIBRA-TABS) 100 MG tablet Take 1 tablet (100 mg total) by mouth 2 (two) times daily. 20 tablet 0  . empagliflozin (JARDIANCE) 25 MG TABS tablet Jardiance 25 mg tablet    . gabapentin (NEURONTIN) 600 MG tablet Take 1,200 mg by mouth 2 (two) times daily.     . Gabapentin Enacarbil 600 MG TBCR Take 1 pill with evening meal    . glimepiride (AMARYL) 1 MG tablet Take 1 mg by mouth daily with breakfast.    . glucose blood (FREESTYLE LITE) test strip USE 1 STRIP EVERY DAY    . hydrochlorothiazide (HYDRODIURIL) 25 MG tablet Take 25 mg by mouth daily.    . Lancets (FREESTYLE) lancets CHECK BLOOD SUGAR EVERY DAY AS DIRECTED    .  latanoprost (XALATAN) 0.005 % ophthalmic solution     . latanoprost (XALATAN) 0.005 % ophthalmic solution Place 0.005 drops into both eyes daily.    Marland Kitchen lisinopril (PRINIVIL,ZESTRIL) 20 MG tablet Take 30 mg by mouth daily.    . metFORMIN (GLUCOPHAGE-XR) 500 MG 24 hr tablet Take 1,000 mg by mouth 2 (two) times daily.    . ondansetron (ZOFRAN) 4 MG tablet Take 1 tablet (4 mg total) by mouth every 6 (six) hours as needed for nausea. 20 tablet 0  . oxyCODONE (OXYCONTIN) 10 mg 12 hr tablet Take 1  tablet (10 mg total) by mouth PRO. 1 tab PO every 12 hours for 3 days, then 1 tab PO daily for 4 days 10 tablet 0  . pantoprazole (PROTONIX) 40 MG tablet Take 40 mg by mouth daily.    . promethazine (PHENERGAN) 25 MG tablet Take 1 tablet (25 mg total) by mouth every 8 (eight) hours as needed for nausea or vomiting. 30 tablet 0  . rOPINIRole (REQUIP XL) 8 MG 24 hr tablet Take 8 mg by mouth at bedtime.    . rosuvastatin (CRESTOR) 10 MG tablet Take 10 mg by mouth.    . senna (SENOKOT) 8.6 MG TABS tablet Take 2 tablets (17.2 mg total) by mouth at bedtime. 60 each 3  . sulfamethoxazole-trimethoprim (BACTRIM) 400-80 MG tablet Take 1 tablet by mouth 2 (two) times daily. 28 tablet 0  . terbinafine (LAMISIL) 250 MG tablet      Current Facility-Administered Medications on File Prior to Visit  Medication Dose Route Frequency Provider Last Rate Last Admin  . triamcinolone acetonide (KENALOG) 10 MG/ML injection 10 mg  10 mg Other Once [place], Shaconda Hajduk, DPM      . triamcinolone acetonide (KENALOG-40) injection 20 mg  20 mg Other Once Landis Martins, DPM        Allergies  Allergen Reactions  . Aleve [Naproxen Sodium] Hives  . Aspirin Other (See Comments)    Unsure Unknown "childhood allergy"  . Penicillins Other (See Comments)    Unsure All pt remembers is nose bleeds associated with this reaction.  Has patient had a PCN reaction causing immediate rash, facial/tongue/throat swelling, SOB or lightheadedness with hypotension: Unknown Has patient had a PCN reaction causing severe rash involving mucus membranes or skin necrosis: Unknown Has patient had a PCN reaction that required hospitalization: Yes Has patient had a PCN reaction occurring within the last 10 years: No If all of the above answers are "NO", then may proceed with Cepha  . Latex Rash    Objective: There were no vitals filed for this visit.  General: No acute distress, AAOx3  Dermatology: Nails x10 mildly elongated and thickened  consistent with onychomycosis.  Neurovascular status intact  Musculoskeletal nonantalgic gait with severe ankle deformity on the left with likely dorsal midfoot arthritis as well since there is significant limited fixed motion and varus deformity.  Assessment and Plan:  Problem List Items Addressed This Visit      Endocrine   Diabetic polyneuropathy ([place])    Other Visit Diagnoses    Pain due to onychomycosis of nail    -  Primary   Cavus deformity of foot, acquired       Ankle deformity, left          -Patient seen and evaluated -Surgical site well healed -Mechanically debrided nails x10 using a sterile nail nipper without incident -May continue with tennis shoe and ankle brace until his new shoes arrive -Return as scheduled in  10 weeks for routine nail care or sooner if problems arise.  Landis Martins, DPM

## 2020-12-06 DIAGNOSIS — G4733 Obstructive sleep apnea (adult) (pediatric): Secondary | ICD-10-CM | POA: Diagnosis not present

## 2021-01-05 ENCOUNTER — Encounter: Payer: Self-pay | Admitting: Orthotics

## 2021-01-05 ENCOUNTER — Other Ambulatory Visit: Payer: Self-pay

## 2021-01-05 ENCOUNTER — Ambulatory Visit: Payer: PPO | Admitting: Sports Medicine

## 2021-01-05 DIAGNOSIS — M216X1 Other acquired deformities of right foot: Secondary | ICD-10-CM | POA: Diagnosis not present

## 2021-01-05 DIAGNOSIS — E1142 Type 2 diabetes mellitus with diabetic polyneuropathy: Secondary | ICD-10-CM | POA: Diagnosis not present

## 2021-01-05 DIAGNOSIS — M21962 Unspecified acquired deformity of left lower leg: Secondary | ICD-10-CM | POA: Diagnosis not present

## 2021-01-05 DIAGNOSIS — M216X9 Other acquired deformities of unspecified foot: Secondary | ICD-10-CM

## 2021-01-05 NOTE — Progress Notes (Signed)
Subjective: Patient presents today for diabetic shoe pick up. Patient voices no new complaints.   Objective: Diabetic shoes were fitted to patient's feet. No discomfort and no rubbing. Patient stated that the left foot and ankle is straighter now that the diabetic shoe is on. Patient is satisfied with the diabetic shoes.   A: Ankle deformity left, cavus deformity left    Plan: Diabetic shoes were dispensed to patient with instructions for break in wear and to call the office with any concerns or questions.   Lattie Haw Donne Baley-Cma

## 2021-01-06 NOTE — Addendum Note (Signed)
Addended by: Landis Martins T on: 01/06/2021 07:49 AM   Modules accepted: Level of Service

## 2021-01-06 NOTE — Progress Notes (Signed)
Patient discussed with medical assistant. Agree with assessment. Charges entered. Patient to follow up as scheduled for continued care or sooner if problems or issues arise. -Dr. Cannon Kettle

## 2021-01-16 DIAGNOSIS — E538 Deficiency of other specified B group vitamins: Secondary | ICD-10-CM | POA: Diagnosis not present

## 2021-01-16 DIAGNOSIS — E1142 Type 2 diabetes mellitus with diabetic polyneuropathy: Secondary | ICD-10-CM | POA: Diagnosis not present

## 2021-01-16 DIAGNOSIS — E6609 Other obesity due to excess calories: Secondary | ICD-10-CM | POA: Diagnosis not present

## 2021-01-16 DIAGNOSIS — I10 Essential (primary) hypertension: Secondary | ICD-10-CM | POA: Diagnosis not present

## 2021-01-16 DIAGNOSIS — E782 Mixed hyperlipidemia: Secondary | ICD-10-CM | POA: Diagnosis not present

## 2021-01-16 DIAGNOSIS — M8949 Other hypertrophic osteoarthropathy, multiple sites: Secondary | ICD-10-CM | POA: Diagnosis not present

## 2021-01-16 DIAGNOSIS — Z6830 Body mass index (BMI) 30.0-30.9, adult: Secondary | ICD-10-CM | POA: Diagnosis not present

## 2021-02-16 ENCOUNTER — Encounter: Payer: Self-pay | Admitting: Podiatry

## 2021-02-16 ENCOUNTER — Ambulatory Visit: Payer: PPO | Admitting: Podiatry

## 2021-02-16 ENCOUNTER — Other Ambulatory Visit: Payer: Self-pay

## 2021-02-16 DIAGNOSIS — B351 Tinea unguium: Secondary | ICD-10-CM | POA: Diagnosis not present

## 2021-02-16 DIAGNOSIS — M79609 Pain in unspecified limb: Secondary | ICD-10-CM | POA: Diagnosis not present

## 2021-02-16 DIAGNOSIS — E1142 Type 2 diabetes mellitus with diabetic polyneuropathy: Secondary | ICD-10-CM

## 2021-02-19 NOTE — Progress Notes (Signed)
  Subjective:  Patient ID: Samuel Hodge, male    DOB: 03/23/1951,  MRN: [id number]  70 y.o. male presents with at risk foot care with history of diabetic neuropathy and painful thick toenails that are difficult to trim. Pain interferes with ambulation. Aggravating factors include wearing enclosed shoe gear. Pain is relieved with periodic professional debridement..    Patient's blood sugar was 147 mg/dl this morning.  PCP: Raina Mina., MD and last visit was: February 2022.  Review of Systems: Negative except as noted in the HPI.   Allergies  Allergen Reactions  . Aleve [Naproxen Sodium] Hives  . Aspirin Other (See Comments)    Unsure Unknown "childhood allergy"  . Penicillins Other (See Comments)    Unsure All pt remembers is nose bleeds associated with this reaction.  Has patient had a PCN reaction causing immediate rash, facial/tongue/throat swelling, SOB or lightheadedness with hypotension: Unknown Has patient had a PCN reaction causing severe rash involving mucus membranes or skin necrosis: Unknown Has patient had a PCN reaction that required hospitalization: Yes Has patient had a PCN reaction occurring within the last 10 years: No If all of the above answers are "NO", then may proceed with Cepha  . Latex Rash    Objective:  There were no vitals filed for this visit. Constitutional Patient is a pleasant 70 y.o. Caucasian male in NAD. AAO x 3.  Vascular Capillary refill time to digits immediate b/l. Palpable pedal pulses b/l LE. Pedal hair sparse. Lower extremity skin temperature gradient within normal limits. No pain with calf compression b/l. No cyanosis or clubbing noted.  Neurologic Normal speech. Protective sensation intact 5/5 intact bilaterally with 10g monofilament b/l. Vibratory sensation intact b/l.  Dermatologic Pedal skin with normal turgor, texture and tone bilaterally. No open wounds bilaterally. No interdigital macerations bilaterally. Toenails 1-5 b/l  elongated, discolored, dystrophic, thickened, crumbly with subungual debris and tenderness to dorsal palpation. Incurvated nailplate medial and lateral border(s) L 2nd toe.  Nail border hypertrophy minimal. There is tenderness to palpation. Sign(s) of infection: no clinical signs of infection noted on examination today..  Orthopedic: Normal muscle strength 5/5 to all lower extremity muscle groups bilaterally. No pain crepitus or joint limitation noted with ROM b/l. No gross bony deformities bilaterally.   No flowsheet data found.     Assessment:   1. Pain due to onychomycosis of nail   2. Diabetic polyneuropathy associated with type 2 diabetes mellitus ([place])    Plan:  Patient was evaluated and treated and all questions answered.  Onychomycosis with pain -Nails palliatively debridement as below. -Educated on self-care  Procedure: Nail Debridement Rationale: Pain Type of Debridement: manual, sharp debridement. Instrumentation: Nail nipper, rotary burr. Number of Nails: 10  -Examined patient. -Patient to continue soft, supportive shoe gear daily. -Toenails 1-5 b/l were debrided in length and girth with sterile nail nippers and dremel without iatrogenic bleeding.  -Offending nail border debrided and curretaged L 2nd toe utilizing sterile nail nipper and currette. Light bleeding addressed with Lumicain Hemostatic Solution.  Border(s) cleansed with alcohol and triple antibiotic ointment applied. Patient instructed to apply Neosporin to L 2nd toe once daily for 7 days. -Patient to report any pedal injuries to medical professional immediately. -Patient/POA to call should there be question/concern in the interim.  Return in about 3 months (around 05/18/2021).  Marzetta Board, DPM

## 2021-03-01 DIAGNOSIS — E6609 Other obesity due to excess calories: Secondary | ICD-10-CM | POA: Diagnosis not present

## 2021-03-01 DIAGNOSIS — Z6830 Body mass index (BMI) 30.0-30.9, adult: Secondary | ICD-10-CM | POA: Diagnosis not present

## 2021-03-01 DIAGNOSIS — E1142 Type 2 diabetes mellitus with diabetic polyneuropathy: Secondary | ICD-10-CM | POA: Diagnosis not present

## 2021-03-01 DIAGNOSIS — I10 Essential (primary) hypertension: Secondary | ICD-10-CM | POA: Diagnosis not present

## 2021-03-07 DIAGNOSIS — G4733 Obstructive sleep apnea (adult) (pediatric): Secondary | ICD-10-CM | POA: Diagnosis not present

## 2021-05-24 DIAGNOSIS — G2581 Restless legs syndrome: Secondary | ICD-10-CM | POA: Diagnosis not present

## 2021-05-24 DIAGNOSIS — G4733 Obstructive sleep apnea (adult) (pediatric): Secondary | ICD-10-CM | POA: Diagnosis not present

## 2021-05-24 DIAGNOSIS — Z9989 Dependence on other enabling machines and devices: Secondary | ICD-10-CM | POA: Diagnosis not present

## 2021-06-15 ENCOUNTER — Ambulatory Visit: Payer: PPO | Admitting: Podiatry

## 2021-06-19 DIAGNOSIS — G4733 Obstructive sleep apnea (adult) (pediatric): Secondary | ICD-10-CM | POA: Diagnosis not present

## 2021-08-03 DIAGNOSIS — E782 Mixed hyperlipidemia: Secondary | ICD-10-CM | POA: Diagnosis not present

## 2021-08-03 DIAGNOSIS — I119 Hypertensive heart disease without heart failure: Secondary | ICD-10-CM | POA: Diagnosis not present

## 2021-08-03 DIAGNOSIS — M1A09X0 Idiopathic chronic gout, multiple sites, without tophus (tophi): Secondary | ICD-10-CM | POA: Diagnosis not present

## 2021-08-03 DIAGNOSIS — Z7984 Long term (current) use of oral hypoglycemic drugs: Secondary | ICD-10-CM | POA: Diagnosis not present

## 2021-08-03 DIAGNOSIS — I517 Cardiomegaly: Secondary | ICD-10-CM | POA: Diagnosis not present

## 2021-08-03 DIAGNOSIS — E1142 Type 2 diabetes mellitus with diabetic polyneuropathy: Secondary | ICD-10-CM | POA: Diagnosis not present

## 2021-08-03 DIAGNOSIS — Z125 Encounter for screening for malignant neoplasm of prostate: Secondary | ICD-10-CM | POA: Diagnosis not present

## 2021-08-03 DIAGNOSIS — G2581 Restless legs syndrome: Secondary | ICD-10-CM | POA: Diagnosis not present

## 2021-08-03 DIAGNOSIS — Z0000 Encounter for general adult medical examination without abnormal findings: Secondary | ICD-10-CM | POA: Diagnosis not present

## 2021-08-03 DIAGNOSIS — D126 Benign neoplasm of colon, unspecified: Secondary | ICD-10-CM | POA: Diagnosis not present

## 2021-08-03 DIAGNOSIS — Z8639 Personal history of other endocrine, nutritional and metabolic disease: Secondary | ICD-10-CM | POA: Diagnosis not present

## 2021-08-03 DIAGNOSIS — M159 Polyosteoarthritis, unspecified: Secondary | ICD-10-CM | POA: Diagnosis not present

## 2021-08-03 DIAGNOSIS — E538 Deficiency of other specified B group vitamins: Secondary | ICD-10-CM | POA: Diagnosis not present

## 2021-08-03 DIAGNOSIS — Z23 Encounter for immunization: Secondary | ICD-10-CM | POA: Diagnosis not present

## 2021-08-03 DIAGNOSIS — Z79899 Other long term (current) drug therapy: Secondary | ICD-10-CM | POA: Diagnosis not present

## 2021-08-24 ENCOUNTER — Encounter: Payer: Self-pay | Admitting: Podiatry

## 2021-08-24 ENCOUNTER — Other Ambulatory Visit: Payer: Self-pay

## 2021-08-24 ENCOUNTER — Ambulatory Visit: Payer: PPO | Admitting: Podiatry

## 2021-08-24 DIAGNOSIS — M79609 Pain in unspecified limb: Secondary | ICD-10-CM | POA: Diagnosis not present

## 2021-08-24 DIAGNOSIS — E1142 Type 2 diabetes mellitus with diabetic polyneuropathy: Secondary | ICD-10-CM | POA: Diagnosis not present

## 2021-08-24 DIAGNOSIS — B351 Tinea unguium: Secondary | ICD-10-CM

## 2021-08-24 NOTE — Progress Notes (Signed)
  Subjective:  Patient ID: Samuel Hodge, male    DOB: [birth year],  MRN: [id number]  70 y.o. male presents with at risk foot care with history of diabetic neuropathy and thick, elongated toenails b/l feet which are tender when wearing enclosed shoe gear..    Patient's blood sugar was 131 mg/dl today.    Patient states he has an ankle brace for LLE and it is uncomfortable. Therefore, he does not wear it. He is using another brace for his left ankle deformity.  Patient does not monitor blood glucose on a daily basis.  PCP: Raina Mina., MD and last visit was: 08/03/2021.  Review of Systems: Negative except as noted in the HPI.   Allergies  Allergen Reactions   Aleve [Naproxen Sodium] Hives   Aspirin Other (See Comments)    Unsure Unknown "childhood allergy"   Penicillins Other (See Comments)    Unsure All pt remembers is nose bleeds associated with this reaction.  Has patient had a PCN reaction causing immediate rash, facial/tongue/throat swelling, SOB or lightheadedness with hypotension: Unknown Has patient had a PCN reaction causing severe rash involving mucus membranes or skin necrosis: Unknown Has patient had a PCN reaction that required hospitalization: Yes Has patient had a PCN reaction occurring within the last 10 years: No If all of the above answers are "NO", then may proceed with Cepha   Latex Rash    Objective:  There were no vitals filed for this visit. Constitutional Patient is a pleasant 70 y.o. Caucasian male WD, WN in NAD. AAO x 3.  Vascular Capillary fill time to digits immediate b/l.  DP/PT pulse(s) are palpable b/l lower extremities. Pedal hair sparse. Lower extremity skin temperature gradient within normal limits. No pain with calf compression b/l. No edema noted b/l lower extremities. No cyanosis or clubbing noted.   Neurologic He has subjective symptoms of neuropathy. Protective sensation intact 5/5 intact bilaterally with 10g monofilament b/l. Vibratory  sensation intact b/l. No clonus b/l.   Dermatologic Pedal skin is warm and supple b/l.  No open wounds b/l lower extremities. No interdigital macerations b/l lower extremities. Toenails 1-5 b/l elongated, discolored, dystrophic, thickened, crumbly with subungual debris and tenderness to dorsal palpation.   Orthopedic: Normal muscle strength 5/5 to all lower extremity muscle groups bilaterally. Patient ambulates independent of any assistive aids.  Severe DJD of left ankle with inversion deformity.     Assessment:   1. Pain due to onychomycosis of nail   2. Diabetic polyneuropathy associated with type 2 diabetes mellitus ([place])    Plan:  Patient was evaluated and treated and all questions answered. Consent given for treatment as described below: -No new findings. No new orders. -We will have a new Pedorthist in the office soon. In January, we will get him scheduled for brace modification. He will also be due for new diabetic shoes at that time . -Continue diabetic foot care principles: inspect feet daily, monitor glucose as recommended by PCP and/or Endocrinologist, and follow prescribed diet per PCP, Endocrinologist and/or dietician. -Toenails 1-5 b/l were debrided in length and girth with sterile nail nippers and dremel without iatrogenic bleeding.  -Patient to report any pedal injuries to medical professional immediately. -Patient/POA to call should there be question/concern in the interim.  Return in about 3 months (around 11/24/2021).  Marzetta Board, DPM

## 2021-08-30 DIAGNOSIS — M7022 Olecranon bursitis, left elbow: Secondary | ICD-10-CM | POA: Insufficient documentation

## 2021-08-30 DIAGNOSIS — M25522 Pain in left elbow: Secondary | ICD-10-CM | POA: Insufficient documentation

## 2021-09-04 DIAGNOSIS — M545 Low back pain, unspecified: Secondary | ICD-10-CM | POA: Diagnosis not present

## 2021-09-04 DIAGNOSIS — S76311D Strain of muscle, fascia and tendon of the posterior muscle group at thigh level, right thigh, subsequent encounter: Secondary | ICD-10-CM | POA: Diagnosis not present

## 2021-09-04 DIAGNOSIS — Z96651 Presence of right artificial knee joint: Secondary | ICD-10-CM | POA: Diagnosis not present

## 2021-09-05 DIAGNOSIS — M79604 Pain in right leg: Secondary | ICD-10-CM | POA: Diagnosis not present

## 2021-09-07 ENCOUNTER — Ambulatory Visit: Payer: PPO | Admitting: Podiatry

## 2021-09-13 DIAGNOSIS — M79604 Pain in right leg: Secondary | ICD-10-CM | POA: Diagnosis not present

## 2021-09-18 DIAGNOSIS — G4733 Obstructive sleep apnea (adult) (pediatric): Secondary | ICD-10-CM | POA: Diagnosis not present

## 2021-09-19 DIAGNOSIS — D225 Melanocytic nevi of trunk: Secondary | ICD-10-CM | POA: Diagnosis not present

## 2021-09-19 DIAGNOSIS — L821 Other seborrheic keratosis: Secondary | ICD-10-CM | POA: Diagnosis not present

## 2021-09-20 DIAGNOSIS — M79604 Pain in right leg: Secondary | ICD-10-CM | POA: Diagnosis not present

## 2021-09-27 DIAGNOSIS — M79604 Pain in right leg: Secondary | ICD-10-CM | POA: Diagnosis not present

## 2021-10-02 DIAGNOSIS — S76311D Strain of muscle, fascia and tendon of the posterior muscle group at thigh level, right thigh, subsequent encounter: Secondary | ICD-10-CM | POA: Diagnosis not present

## 2021-10-04 DIAGNOSIS — M79604 Pain in right leg: Secondary | ICD-10-CM | POA: Diagnosis not present

## 2021-10-11 DIAGNOSIS — M79604 Pain in right leg: Secondary | ICD-10-CM | POA: Diagnosis not present

## 2021-10-19 DIAGNOSIS — U071 COVID-19: Secondary | ICD-10-CM | POA: Diagnosis not present

## 2021-10-19 DIAGNOSIS — Z20822 Contact with and (suspected) exposure to covid-19: Secondary | ICD-10-CM | POA: Diagnosis not present

## 2021-10-20 DIAGNOSIS — U071 COVID-19: Secondary | ICD-10-CM | POA: Diagnosis not present

## 2021-12-21 ENCOUNTER — Other Ambulatory Visit: Payer: Self-pay | Admitting: *Deleted

## 2021-12-21 ENCOUNTER — Encounter: Payer: Self-pay | Admitting: Podiatry

## 2021-12-21 ENCOUNTER — Ambulatory Visit: Payer: PPO | Admitting: Podiatry

## 2021-12-21 DIAGNOSIS — L84 Corns and callosities: Secondary | ICD-10-CM

## 2021-12-21 DIAGNOSIS — M79609 Pain in unspecified limb: Secondary | ICD-10-CM | POA: Diagnosis not present

## 2021-12-21 DIAGNOSIS — E119 Type 2 diabetes mellitus without complications: Secondary | ICD-10-CM

## 2021-12-21 DIAGNOSIS — M21962 Unspecified acquired deformity of left lower leg: Secondary | ICD-10-CM

## 2021-12-21 DIAGNOSIS — B351 Tinea unguium: Secondary | ICD-10-CM | POA: Diagnosis not present

## 2021-12-21 DIAGNOSIS — E1142 Type 2 diabetes mellitus with diabetic polyneuropathy: Secondary | ICD-10-CM

## 2021-12-21 NOTE — Patient Instructions (Signed)
Apply Betadine to left 2nd toe once daily until healed. Call office if condition worsens or does not heal.

## 2021-12-21 NOTE — Progress Notes (Signed)
ANNUAL DIABETIC FOOT EXAM  Subjective: Samuel Hodge presents today for for annual diabetic foot examination.  Patient relates 10 year h/o diabetes.  Patient denies any h/o foot wounds.  Patient has h/o foot ulcer of left foot, which healed via help of local wound care.  Patient has been diagnosed with neuropathy and it is managed with gabapentin.  Patient's blood sugar was 124 mg/dl today. Patient did not check blood glucose this morning.  Risk factors:  h/o chronic gout, diabetic neuropathy, hyperlipidemia, HTN.  Raina Mina., MD is patient's PCP. Last visit was August 03, 2021.  Past Medical History:  Diagnosis Date   Arthritis    Diabetes mellitus without complication ([place])    takes Metformin and Glimepiride daily   GERD (gastroesophageal reflux disease)    takes Pantoprazole daily   History of colon polyps    benign   History of skin cancer    Hypertension    takes Lisinopril,Amlodipine,HCTZ  daily   Joint pain    Joint swelling    Nocturia    Osteoarthritis of both ankles    WEARS BRACES BOTH ANKLES   Pneumonia    hx of-many yrs ago   Restless leg    takes Ropinirole daily   Patient Active Problem List   Diagnosis Date Noted   Arthritis of foot 01/14/2020   Pressure injury of left foot, unstageable ([place]) 01/14/2020   Osteoarthritis of wrist 07/16/2019   Iron deficiency 07/08/2019   History of iron deficiency 07/08/2019   Acquired cavovarus deformity of foot 04/27/2019   History of total knee arthroplasty 11/21/2018   OSA (obstructive sleep apnea) 02/24/2018   Left hamstring muscle strain 06/28/2017   Vitamin B12 deficiency 12/13/2016   Chronic gout of multiple sites 04/04/2016   Chronic gouty arthritis 04/04/2016   Gastroesophageal reflux disease without esophagitis 04/04/2016   Mixed hyperlipidemia 04/04/2016   Multiple-type hyperlipidemia 04/04/2016   Benign neoplasm of colon 04/03/2016   Cardiac arrhythmia 04/03/2016   Cardiac enlargement  04/03/2016   Cardiomegaly 04/03/2016   Diabetes, polyneuropathy ([place]) 04/03/2016   Diabetic polyneuropathy ([place]) 04/03/2016   Essential hypertension 04/03/2016   Generalized OA 04/03/2016   Hearing loss 04/03/2016   High risk medication use 04/03/2016   Hydrocele of testis 04/03/2016   Hypogonadism in male 04/03/2016   Obesity (BMI 30-39.9) 04/03/2016   Peripheral neuropathy 04/03/2016   Primary osteoarthritis involving multiple joints 04/03/2016   RLS (restless legs syndrome) 04/03/2016   Thoracic radiculopathy 04/03/2016   Thoracic root lesion 04/03/2016   Type 2 diabetes mellitus with diabetic polyneuropathy, without long-term current use of insulin ([place]) 04/03/2016   Primary osteoarthritis of right knee 10/10/2015   Restless legs syndrome 12/14/2013   Dyspnea and respiratory abnormality 02/16/2013   Past Surgical History:  Procedure Laterality Date   COLONOSCOPY     ESOPHAGOGASTRODUODENOSCOPY     FRACTURE SURGERY  1968   left hip   HAND SURGERY  1973   rt hand   HYDROCELE EXCISION / REPAIR  1986/1974   ROTATOR CUFF REPAIR  2013   rt shoulder   TONSILLECTOMY     TOTAL HIP ARTHROPLASTY Right 08/19/2015   Procedure: RIGHT TOTAL HIP ARTHROPLASTY ANTERIOR APPROACH;  Surgeon: Rod Can, MD;  Location: [facility];  Service: Orthopedics;  Laterality: Right;   TOTAL KNEE ARTHROPLASTY Right 10/10/2015   Procedure: TOTAL RIGHT  KNEE ARTHROPLASTY;  Surgeon: Rod Can, MD;  Location: [facility];  Service: Orthopedics;  Laterality: Right;   Current Outpatient Medications on  File Prior to Visit  Medication Sig Dispense Refill   amLODipine (NORVASC) 5 MG tablet Take 5 mg by mouth daily.     apixaban (ELIQUIS) 2.5 MG TABS tablet Take 1 tablet (2.5 mg total) by mouth every 12 (twelve) hours. 60 tablet 0   cyclobenzaprine (FLEXERIL) 10 MG tablet Take 10 mg by mouth.     docusate sodium (COLACE) 100 MG capsule Take 1 capsule (100 mg total) by mouth 2 (two) times daily as needed for mild  constipation. 20 capsule 0   dorzolamide-timolol (COSOPT) 22.3-6.8 MG/ML ophthalmic solution 1 drop 2 (two) times daily.     empagliflozin (JARDIANCE) 25 MG TABS tablet Jardiance 25 mg tablet     Gabapentin Enacarbil 600 MG TBCR Take 1 pill with evening meal     glimepiride (AMARYL) 1 MG tablet Take 1 mg by mouth daily with breakfast.     glucose blood (FREESTYLE LITE) test strip USE 1 STRIP EVERY DAY     hydrochlorothiazide (HYDRODIURIL) 25 MG tablet Take 25 mg by mouth daily.     JANUVIA 50 MG tablet Take 50 mg by mouth daily.     Lancets (FREESTYLE) lancets CHECK BLOOD SUGAR EVERY DAY AS DIRECTED     latanoprost (XALATAN) 0.005 % ophthalmic solution Place 0.005 drops into both eyes daily.     metFORMIN (GLUCOPHAGE-XR) 500 MG 24 hr tablet Take 1,000 mg by mouth 2 (two) times daily.     ondansetron (ZOFRAN) 4 MG tablet Take 1 tablet (4 mg total) by mouth every 6 (six) hours as needed for nausea. 20 tablet 0   oxyCODONE (OXYCONTIN) 10 mg 12 hr tablet Take 1 tablet (10 mg total) by mouth PRO. 1 tab PO every 12 hours for 3 days, then 1 tab PO daily for 4 days 10 tablet 0   promethazine (PHENERGAN) 25 MG tablet Take 1 tablet (25 mg total) by mouth every 8 (eight) hours as needed for nausea or vomiting. 30 tablet 0   rOPINIRole (REQUIP XL) 8 MG 24 hr tablet Take 8 mg by mouth at bedtime.     rosuvastatin (CRESTOR) 10 MG tablet Take 10 mg by mouth.     senna (SENOKOT) 8.6 MG TABS tablet Take 2 tablets (17.2 mg total) by mouth at bedtime. 60 each 3   Current Facility-Administered Medications on File Prior to Visit  Medication Dose Route Frequency Provider Last Rate Last Admin   triamcinolone acetonide (KENALOG) 10 MG/ML injection 10 mg  10 mg Other Once Samuel Hodge, DPM       triamcinolone acetonide (KENALOG-40) injection 20 mg  20 mg Other Once Samuel Hodge, DPM        Allergies  Allergen Reactions   Aleve [Naproxen Sodium] Hives   Aspirin Other (See Comments)    Unsure Unknown  "childhood allergy"   Penicillins Other (See Comments)    Unsure All pt remembers is nose bleeds associated with this reaction.  Has patient had a PCN reaction causing immediate rash, facial/tongue/throat swelling, SOB or lightheadedness with hypotension: Unknown Has patient had a PCN reaction causing severe rash involving mucus membranes or skin necrosis: Unknown Has patient had a PCN reaction that required hospitalization: Yes Has patient had a PCN reaction occurring within the last 10 years: No If all of the above answers are "NO", then may proceed with Cepha   Latex Rash   Social History   Occupational History   Not on file  Tobacco Use   Smoking status: Never   Smokeless  tobacco: Never  Substance and Sexual Activity   Alcohol use: No   Drug use: No   Sexual activity: Never   History reviewed. No pertinent family history.  There is no immunization history on file for this patient.   Review of Systems: Negative except as noted in the HPI.   Objective: There were no vitals filed for this visit.  CARMINO OCAIN is a pleasant 71 y.o. male in NAD. AAO X 3.  Vascular Examination: Capillary refill time to digits immediate b/l. Palpable pedal pulses b/l LE. Pedal hair absent. No pain with calf compression b/l. Lower extremity skin temperature gradient within normal limits. No edema noted b/l LE. No cyanosis or clubbing noted b/l LE.  Dermatological Examination: Pedal integument with normal turgor, texture and tone BLE. No open wounds b/l LE. No interdigital macerations noted b/l LE. Toenails 1-5 bilaterally elongated, discolored, dystrophic, thickened, and crumbly with subungual debris and tenderness to dorsal palpation. There is evidence of subungual seroma of the L 2nd toe. There is onycholysis of nailplate. There is no  tenderness to palpation. No erythema, no edema.  Musculoskeletal Examination: Muscle strength 5/5 to all lower extremity muscle groups bilaterally. Severe  inversion ankle deformity left ankle. Wearing ankle brace on today's visit .  Footwear Assessment: Does the patient wear appropriate shoes? Yes. Does the patient need inserts/orthotics? Patient's ankle deformity requires brace stabilization.  Neurological Examination: Pt has subjective symptoms of neuropathy. Protective sensation intact 5/5 intact bilaterally with 10g monofilament b/l. Vibratory sensation intact b/l.  Assessment: 1. Pain due to onychomycosis of nail   2. Pre-ulcerative calluses   3. Ankle deformity, left   4. Diabetic polyneuropathy associated with type 2 diabetes mellitus ([place])   5. Encounter for diabetic foot exam ([place])     ADA Risk Categorization: High Risk  Patient has one or more of the following: Loss of protective sensation Absent pedal pulses Severe Foot deformity History of foot ulcer  Plan: -Left 2nd digit cleansed and Betadine solution applied with light dressing. He is to apply Betadine to left 2nd toe once daily until healed. Call office if condition worsens or does not heal. -Diabetic foot examination performed today. -Continue foot and shoe inspections daily. Monitor blood glucose per PCP/Endocrinologist's recommendations. -Mycotic toenails 1-5 bilaterally were debrided in length and girth with sterile nail nippers and dremel without incident. -Patient/POA to call should there be question/concern in the interim.  Return in about 3 months (around 03/20/2022).  Marzetta Board, DPM

## 2022-01-31 DIAGNOSIS — R32 Unspecified urinary incontinence: Secondary | ICD-10-CM | POA: Insufficient documentation

## 2022-03-07 ENCOUNTER — Ambulatory Visit: Payer: PPO

## 2022-03-07 DIAGNOSIS — M21962 Unspecified acquired deformity of left lower leg: Secondary | ICD-10-CM

## 2022-03-07 NOTE — Progress Notes (Signed)
SITUATION ?Reason for Consult: Follow-up with left AFO ?Patient / Caregiver Report: Patient has not been able to wear the brace due to poor shoe fit and pain at lateral ankle ? ?OBJECTIVE DATA ?History / Diagnosis:  ?  ICD-10-CM   ?1. Ankle deformity, left  M21.962   ?  ? ? ?Change in Pathology: None ? ?ACTIONS PERFORMED ?Patient's equipment was checked for structural stability and fit. Removed lateral wedge. Patient reports comfort as he is no longer being actively corrected for a fixed deformity. Device(s) intact and fit is excellent. All questions answered and concerns addressed. ? ?PLAN ?Follow-up as needed (PRN). Plan of care discussed with and agreed upon by patient / caregiver. ? ?

## 2022-03-22 ENCOUNTER — Encounter: Payer: Self-pay | Admitting: Podiatry

## 2022-03-22 ENCOUNTER — Other Ambulatory Visit: Payer: Self-pay | Admitting: *Deleted

## 2022-03-22 ENCOUNTER — Ambulatory Visit: Payer: PPO | Admitting: Podiatry

## 2022-03-22 DIAGNOSIS — E1142 Type 2 diabetes mellitus with diabetic polyneuropathy: Secondary | ICD-10-CM

## 2022-03-22 DIAGNOSIS — R21 Rash and other nonspecific skin eruption: Secondary | ICD-10-CM | POA: Diagnosis not present

## 2022-03-22 DIAGNOSIS — B351 Tinea unguium: Secondary | ICD-10-CM | POA: Diagnosis not present

## 2022-03-22 DIAGNOSIS — M79609 Pain in unspecified limb: Secondary | ICD-10-CM | POA: Diagnosis not present

## 2022-03-22 MED ORDER — TRIAMCINOLONE ACETONIDE 0.1 % EX OINT: 1.0000 "application " | TOPICAL_OINTMENT | Freq: Two times a day (BID) | CUTANEOUS | 0 refills | Status: DC

## 2022-03-22 NOTE — Patient Instructions (Signed)
Rash, Adult  A rash is a change in the color of your skin. A rash can also change the way your skin feels. There are many different conditions and factors that can cause a rash. Follow these instructions at home: The goal of treatment is to stop the itching and keep the rash from spreading. Watch for any changes in your symptoms. Let your doctor know about them. Follow these instructions to help with your condition: Medicine Take or apply over-the-counter and prescription medicines only as told by your doctor. These may include medicines: To treat red or swollen skin (corticosteroid creams). To treat itching. To treat an allergy (oral antihistamines). To treat very bad symptoms (oral corticosteroids).  Skin care Put cool cloths (compresses) on the affected areas. Do not scratch or rub your skin. Avoid covering the rash. Make sure that the rash is exposed to air as much as possible. Managing itching and discomfort Avoid hot showers or baths. These can make itching worse. A cold shower may help. Try taking a bath with: Epsom salts. You can get these at your local pharmacy or grocery store. Follow the instructions on the package. Baking soda. Pour a small amount into the bath as told by your doctor. Colloidal oatmeal. You can get this at your local pharmacy or grocery store. Follow the instructions on the package. Try putting baking soda paste onto your skin. Stir water into baking soda until it gets like a paste. Try putting on a lotion that relieves itchiness (calamine lotion). Keep cool and out of the sun. Sweating and being hot can make itching worse. General instructions  Rest as needed. Drink enough fluid to keep your pee (urine) pale yellow. Wear loose-fitting clothing. Avoid scented soaps, detergents, and perfumes. Use gentle soaps, detergents, perfumes, and other cosmetic products. Avoid anything that causes your rash. Keep a journal to help track what causes your rash. Write  down: What you eat. What cosmetic products you use. What you drink. What you wear. This includes jewelry. Keep all follow-up visits as told by your doctor. This is important. Contact a doctor if: You sweat at night. You lose weight. You pee (urinate) more than normal. You pee less than normal, or you notice that your pee is a darker color than normal. You feel weak. You throw up (vomit). Your skin or the whites of your eyes look yellow (jaundice). Your skin: Tingles. Is numb. Your rash: Does not go away after a few days. Gets worse. You are: More thirsty than normal. More tired than normal. You have: New symptoms. Pain in your belly (abdomen). A fever. Watery poop (diarrhea). Get help right away if: You have a fever and your symptoms suddenly get worse. You start to feel mixed up (confused). You have a very bad headache or a stiff neck. You have very bad joint pains or stiffness. You have jerky movements that you cannot control (seizure). Your rash covers all or most of your body. The rash may or may not be painful. You have blisters that: Are on top of the rash. Grow larger. Grow together. Are painful. Are inside your nose or mouth. You have a rash that: Looks like purple pinprick-sized spots all over your body. Has a "bull's eye" or looks like a target. Is red and painful, causes your skin to peel, and is not from being in the sun too long. Summary A rash is a change in the color of your skin. A rash can also change the way your skin   feels. The goal of treatment is to stop the itching and keep the rash from spreading. Take or apply over-the-counter and prescription medicines only as told by your doctor. Contact a doctor if you have new symptoms or symptoms that get worse. Keep all follow-up visits as told by your doctor. This is important. This information is not intended to replace advice given to you by your health care provider. Make sure you discuss any  questions you have with your health care provider. Document Revised: 08/10/2021 Document Reviewed: 08/10/2021 Elsevier Patient Education  2023 Elsevier Inc.  

## 2022-03-31 NOTE — Progress Notes (Signed)
  Subjective:  Patient ID: Samuel Hodge, male    DOB: 1951/10/14,  MRN: [id number]  Samuel Hodge presents to clinic today for at risk foot care with history of diabetic neuropathy and painful, discolored, thick toenails which interfere with daily activities  Patient states blood glucose was 214 mg/dl today.  Last known HgA1c was 7.4%.  New problem(s): None.  Patient states he developed a rash on the inside of his left ankle. States it clears up and comes back. He has tried anti-itch cream and antibiotic ointment with no improvement  PCP is Raina Mina., MD , and last visit was January 31, 2022.  Allergies  Allergen Reactions   Aleve [Naproxen Sodium] Hives   Aspirin Other (See Comments)    Unsure Unknown "childhood allergy"   Penicillins Other (See Comments)    Unsure All pt remembers is nose bleeds associated with this reaction.  Has patient had a PCN reaction causing immediate rash, facial/tongue/throat swelling, SOB or lightheadedness with hypotension: Unknown Has patient had a PCN reaction causing severe rash involving mucus membranes or skin necrosis: Unknown Has patient had a PCN reaction that required hospitalization: Yes Has patient had a PCN reaction occurring within the last 10 years: No If all of the above answers are "NO", then may proceed with Cepha   Latex Rash    Review of Systems: Negative except as noted in the HPI.  Objective: There were no vitals filed for this visit.  Samuel Hodge is a pleasant 71 y.o. male in NAD. AAO X 3.  Vascular Examination: Capillary refill time to digits immediate b/l. Palpable pedal pulses b/l LE. Pedal hair absent. No pain with calf compression b/l. Lower extremity skin temperature gradient within normal limits. No edema noted b/l LE. No cyanosis or clubbing noted b/l LE.  Dermatological Examination: Pedal integument with normal turgor, texture and tone BLE. No open wounds b/l LE. No interdigital macerations noted b/l LE.  Toenails 1-5 bilaterally elongated, discolored, dystrophic, thickened, and crumbly with subungual debris and tenderness to dorsal palpation.   Skin eruption on medial aspect of the left ankle with evidence of old excoriations. No erythema, no edema, no active drainage, no purulence, no odor.  Musculoskeletal Examination: Muscle strength 5/5 to all lower extremity muscle groups bilaterally. Severe inversion ankle deformity left ankle. Wearing ankle brace on today's visit .  Neurological Examination: Pt has subjective symptoms of neuropathy. Protective sensation intact 5/5 intact bilaterally with 10g monofilament b/l. Vibratory sensation intact b/l.  Assessment/Plan: 1. Pain due to onychomycosis of nail  2. Rash and nonspecific skin eruption - triamcinolone ointment (KENALOG) 0.1 %; Apply 1 application. topically 2 (two) times daily. Apply to rash twice daily until resolved.  Dispense: 80 g; Refill: 0  3. Diabetic polyneuropathy associated with type 2 diabetes mellitus ([place]) -Patient was evaluated and treated. All patient's and/or POA's questions/concerns answered on today's visit. -Patient to continue soft, supportive shoe gear daily. -Mycotic toenails 1-5 bilaterally were debrided in length and girth with sterile nail nippers and dremel without incident. -Patient/POA to call should there be question/concern in the interim.   Return in about 3 months (around 06/22/2022).  Marzetta Board, DPM

## 2022-04-04 DIAGNOSIS — M19072 Primary osteoarthritis, left ankle and foot: Secondary | ICD-10-CM | POA: Insufficient documentation

## 2022-06-21 ENCOUNTER — Ambulatory Visit: Payer: PPO | Admitting: Podiatry

## 2022-09-20 ENCOUNTER — Ambulatory Visit: Payer: PPO | Admitting: Podiatry

## 2022-09-20 ENCOUNTER — Encounter: Payer: Self-pay | Admitting: Podiatry

## 2022-09-20 DIAGNOSIS — M79609 Pain in unspecified limb: Secondary | ICD-10-CM | POA: Diagnosis not present

## 2022-09-20 DIAGNOSIS — B351 Tinea unguium: Secondary | ICD-10-CM | POA: Diagnosis not present

## 2022-09-20 DIAGNOSIS — E1142 Type 2 diabetes mellitus with diabetic polyneuropathy: Secondary | ICD-10-CM | POA: Diagnosis not present

## 2022-09-20 DIAGNOSIS — L84 Corns and callosities: Secondary | ICD-10-CM

## 2022-09-20 NOTE — Progress Notes (Signed)
  Subjective:  Patient ID: Samuel Hodge, male    DOB: March 12, 1951,  MRN: [id number]  Samuel Hodge presents to clinic today for at risk foot care with history of diabetic neuropathy and painful thick toenails that are difficult to trim. Pain interferes with ambulation. Aggravating factors include wearing enclosed shoe gear. Pain is relieved with periodic professional debridement.  Chief Complaint  Patient presents with   Nail Problem    DFC BG - 167 A1C - 8.2 PCP - Dr Gilford Rile , last OV August 28 2022   New problem(s): None.   PCP is Raina Mina., MD , and last visit was August 28, 2022.  Allergies  Allergen Reactions   Aleve [Naproxen Sodium] Hives   Aspirin Other (See Comments)    Unsure Unknown "childhood allergy"   Empagliflozin     Other Reaction(s): Bladder Spasms (intolerance)  Other reaction(s): Bladder Spasms (intolerance)   Penicillins Other (See Comments)    Unsure All pt remembers is nose bleeds associated with this reaction.  Has patient had a PCN reaction causing immediate rash, facial/tongue/throat swelling, SOB or lightheadedness with hypotension: Unknown Has patient had a PCN reaction causing severe rash involving mucus membranes or skin necrosis: Unknown Has patient had a PCN reaction that required hospitalization: Yes Has patient had a PCN reaction occurring within the last 10 years: No If all of the above answers are "NO", then may proceed with Cepha   Latex Rash   Naproxen Hives and Rash    Review of Systems: Negative except as noted in the HPI.  Objective:   QUIENTIN Hodge is a pleasant 71 y.o. male in NAD. AAO x 3.  Vascular Examination: Capillary refill time to digits immediate b/l. Palpable pedal pulses b/l LE. Pedal hair absent. No pain with calf compression b/l. Lower extremity skin temperature gradient within normal limits. No edema noted b/l LE. No cyanosis or clubbing noted b/l LE.  Dermatological Examination: Pedal integument  with normal turgor, texture and tone BLE. No open wounds b/l LE. No interdigital macerations noted b/l LE. Toenails 1-5 bilaterally elongated, discolored, dystrophic, thickened, and crumbly with subungual debris and tenderness to dorsal palpation.   Incurvated nailplate L 3rd toe with tenderness to palpation. No erythema, no edema, no drainage noted. No fluctuance.   Musculoskeletal Examination: Muscle strength 5/5 to all lower extremity muscle groups bilaterally. Severe inversion ankle deformity left ankle. Wearing ankle brace on today's visit .  Neurological Examination: Pt has subjective symptoms of neuropathy. Protective sensation intact 5/5 intact bilaterally with 10g monofilament b/l. Vibratory sensation intact b/l.  Assessment/Plan: 1. Pain due to onychomycosis of nail   2. Diabetic polyneuropathy associated with type 2 diabetes mellitus ([place])     No orders of the defined types were placed in this encounter.   -Consent given for treatment as described below: -Continue supportive shoe gear daily. -Toenails 1-5 b/l were debrided in length and girth with sterile nail nippers and dremel without iatrogenic bleeding.  -No invasive procedure(s) performed. Offending nail border debrided and curretaged L 3rd toe utilizing sterile nail nipper and currette. Border(s) cleansed with alcohol and triple antibiotic ointment applied. Patient/POA/Caregiver/Facility instructed to apply triple antibiotic ointment  to L 3rd toe once daily for 7 days. Call office if there are any concerns. -Patient/POA to call should there be question/concern in the interim.   Return in about 3 months (around 12/21/2022).  Marzetta Board, DPM

## 2023-01-17 ENCOUNTER — Ambulatory Visit: Payer: PPO | Admitting: Podiatry

## 2023-01-18 ENCOUNTER — Ambulatory Visit: Payer: PPO | Admitting: Podiatry

## 2023-01-18 DIAGNOSIS — E1142 Type 2 diabetes mellitus with diabetic polyneuropathy: Secondary | ICD-10-CM

## 2023-01-18 DIAGNOSIS — M79676 Pain in unspecified toe(s): Secondary | ICD-10-CM | POA: Diagnosis not present

## 2023-01-18 DIAGNOSIS — B351 Tinea unguium: Secondary | ICD-10-CM | POA: Diagnosis not present

## 2023-01-18 NOTE — Progress Notes (Signed)
  Subjective:  Patient ID: Samuel Hodge, male    DOB: October 03, 1951,  MRN: [id number]  Chief Complaint  Patient presents with   Nail Problem    Diabetic Foot Care     72 y.o. male presents with the above complaint. History confirmed with patient. Patient presenting with pain related to dystrophic thickened elongated nails. Patient is unable to trim own nails related to nail dystrophy and/or mobility issues. Patient does have a history of T2DM. Patient does have some numbness in the toes.   Objective:  Physical Exam: warm, good capillary refill nail exam onychomycosis of the toenails, onycholysis, and dystrophic nails DP pulses palpable, PT pulses palpable, and protective sensation absent Left Foot:  Pain with palpation of nails due to elongation and dystrophic growth.  Right Foot: Pain with palpation of nails due to elongation and dystrophic growth.   Assessment:   1. Pain due to onychomycosis of nail   2. Diabetic polyneuropathy associated with type 2 diabetes mellitus ([place])      Plan:  Patient was evaluated and treated and all questions answered.  #Onychomycosis with pain  -Nails palliatively debrided as below. -Educated on self-care  Procedure: Nail Debridement Rationale: Pain Type of Debridement: manual, sharp debridement. Instrumentation: Nail nipper, rotary burr. Number of Nails: 10  Return in about 3 months (around 04/20/2023) for [facility].         Everitt Amber, DPM [facility]

## 2023-04-22 ENCOUNTER — Ambulatory Visit: Payer: PPO | Admitting: Podiatry

## 2023-04-22 DIAGNOSIS — B351 Tinea unguium: Secondary | ICD-10-CM

## 2023-04-22 DIAGNOSIS — M79609 Pain in unspecified limb: Secondary | ICD-10-CM | POA: Diagnosis not present

## 2023-04-22 DIAGNOSIS — E1142 Type 2 diabetes mellitus with diabetic polyneuropathy: Secondary | ICD-10-CM

## 2023-04-22 NOTE — Progress Notes (Signed)
  Subjective:  Patient ID: Samuel Hodge, male    DOB: [birth year],  MRN: [id number]  Chief Complaint  Patient presents with   Nail Problem    Diabetic Foot Care     72 y.o. male presents with the above complaint. History confirmed with patient. Patient presenting with pain related to dystrophic thickened elongated nails. Patient is unable to trim own nails related to nail dystrophy and/or mobility issues. Patient does have a history of T2DM. Patient does have some numbness in the toes.   Objective:  Physical Exam: warm, good capillary refill nail exam onychomycosis of the toenails, onycholysis, and dystrophic nails DP pulses palpable, PT pulses palpable, and protective sensation absent Left Foot:  Pain with palpation of nails due to elongation and dystrophic growth.  Right Foot: Pain with palpation of nails due to elongation and dystrophic growth.   Assessment:   1. Pain due to onychomycosis of nail   2. Diabetic polyneuropathy associated with type 2 diabetes mellitus ([facility])       Plan:  Patient was evaluated and treated and all questions answered.  #Onychomycosis with pain  -Nails palliatively debrided as below. -Educated on self-care  Procedure: Nail Debridement Rationale: Pain Type of Debridement: manual, sharp debridement. Instrumentation: Nail nipper, rotary burr. Number of Nails: 10  Return in about 3 months (around 07/23/2023) for [facility].         Corinna Gab, DPM [facility] / [facility]

## 2023-07-23 ENCOUNTER — Ambulatory Visit: Payer: PPO | Admitting: Podiatry

## 2023-07-23 DIAGNOSIS — B351 Tinea unguium: Secondary | ICD-10-CM | POA: Diagnosis not present

## 2023-07-23 DIAGNOSIS — M79609 Pain in unspecified limb: Secondary | ICD-10-CM

## 2023-07-23 DIAGNOSIS — E1142 Type 2 diabetes mellitus with diabetic polyneuropathy: Secondary | ICD-10-CM

## 2023-07-23 NOTE — Progress Notes (Signed)
  Subjective:  Patient ID: Samuel Hodge, male    DOB: 04-Apr-1951,  MRN: [id number]  Chief Complaint  Patient presents with   Nail Problem    Diabetic Foot Care-nail trim     72 y.o. male presents with the above complaint. History confirmed with patient. Patient presenting with pain related to dystrophic thickened elongated nails. Patient is unable to trim own nails related to nail dystrophy and/or mobility issues. Patient does have a history of T2DM. Patient does have some numbness in the toes.   Objective:  Physical Exam: warm, good capillary refill nail exam onychomycosis of the toenails, onycholysis, and dystrophic nails DP pulses palpable, PT pulses palpable, and protective sensation absent Left Foot:  Pain with palpation of nails due to elongation and dystrophic growth.  Right Foot: Pain with palpation of nails due to elongation and dystrophic growth.   Assessment:   1. Pain due to onychomycosis of nail   2. Diabetic polyneuropathy associated with type 2 diabetes mellitus (HCC)      Plan:  Patient was evaluated and treated and all questions answered.  #Onychomycosis with pain  -Nails palliatively debrided as below. -Educated on self-care  Procedure: Nail Debridement Rationale: Pain Type of Debridement: manual, sharp debridement. Instrumentation: Nail nipper, rotary burr. Number of Nails: 10  Return in about 3 months (around 10/22/2023).         Corinna Gab, DPM [facility] / [facility]

## 2023-10-22 ENCOUNTER — Ambulatory Visit: Payer: PPO | Admitting: Podiatry

## 2023-10-22 ENCOUNTER — Encounter: Payer: Self-pay | Admitting: Podiatry

## 2023-10-22 DIAGNOSIS — L84 Corns and callosities: Secondary | ICD-10-CM | POA: Diagnosis not present

## 2023-10-22 DIAGNOSIS — B351 Tinea unguium: Secondary | ICD-10-CM | POA: Diagnosis not present

## 2023-10-22 DIAGNOSIS — M79676 Pain in unspecified toe(s): Secondary | ICD-10-CM

## 2023-10-22 DIAGNOSIS — E1142 Type 2 diabetes mellitus with diabetic polyneuropathy: Secondary | ICD-10-CM

## 2023-10-22 NOTE — Progress Notes (Signed)
  Subjective:  Patient ID: Samuel Hodge, male    DOB: [birth year],  MRN: [id number]  Chief Complaint  Patient presents with   Foot Care    Last A1c: 9.2. No anticoag. Needs nails trimmed. No other concerns at this time.     72 y.o. male presents with the above complaint. History confirmed with patient. Patient presenting with pain related to dystrophic thickened elongated nails. Patient is unable to trim own nails related to nail dystrophy and/or mobility issues. Patient does have a history of T2DM. Patient does have some numbness in the toes.  History of left ankle fusion about 1 year ago.  Doing well with this.  Objective:  Physical Exam: warm, good capillary refill nail exam onychomycosis of the toenails, onycholysis, and dystrophic nails DP pulses palpable, PT pulses palpable, and protective sensation absent Pedal skin atrophic.  Hair growth absent. Left Foot:  Pain with palpation of nails due to elongation and dystrophic growth.  Preulcerative callus left subfifth metatarsal head with hyperkeratotic tissue. Right Foot: Pain with palpation of nails due to elongation and dystrophic growth.   Assessment:   1. Pain due to onychomycosis of nail   2. Diabetic polyneuropathy associated with type 2 diabetes mellitus (HCC)   3. Pre-ulcerative calluses      Plan:  Patient was evaluated and treated and all questions answered.  #Onychomycosis with pain  -Nails palliatively debrided as below. -Educated on self-care  Procedure: Nail Debridement Rationale: Pain Type of Debridement: manual, sharp debridement. Instrumentation: Nail nipper, rotary burr. Number of Nails: 10  # Preulcerative callus left foot subfifth metatarsal head -Sharply debrided down using 15 blade without incident -Discussed offloading techniques such as right finger scrubs, home care with pumice stone or emery board, shoegear modification, felt and foam padding. -Advised patient to wash the site closely as this is  the same side as his ankle fusion.  Return in about 3 months (around 01/20/2024) for Diabetic Foot Care.         Bronwen Betters, DPM [facility] / [facility]

## 2024-01-28 ENCOUNTER — Encounter: Payer: Self-pay | Admitting: Podiatry

## 2024-01-28 ENCOUNTER — Ambulatory Visit: Payer: PPO | Admitting: Podiatry

## 2024-01-28 DIAGNOSIS — E1142 Type 2 diabetes mellitus with diabetic polyneuropathy: Secondary | ICD-10-CM | POA: Diagnosis not present

## 2024-01-28 DIAGNOSIS — L84 Corns and callosities: Secondary | ICD-10-CM | POA: Diagnosis not present

## 2024-01-28 DIAGNOSIS — B351 Tinea unguium: Secondary | ICD-10-CM | POA: Diagnosis not present

## 2024-01-28 DIAGNOSIS — M79609 Pain in unspecified limb: Secondary | ICD-10-CM

## 2024-01-28 NOTE — Patient Instructions (Signed)
 More silicone pads can be purchased from:  [email or web address]  Look for a dancers pad for your right foot since you have padding the opposite side on the left foot.

## 2024-01-28 NOTE — Progress Notes (Unsigned)
  Subjective:  Patient ID: Samuel Hodge, male    DOB: [birth year],  MRN: [id number]  Chief Complaint  Patient presents with   [facility]    Last A1c: 9.1. takes no anticoag. One callus on bottom of left foot.     73 y.o. male presents with the above complaint. History confirmed with patient. Patient presenting with pain related to dystrophic thickened elongated nails. Patient is unable to trim own nails related to nail dystrophy and/or mobility issues. Patient does have a history of T2DM. Patient does have some numbness in the toes.  History of left ankle fusion  doing well with this.  Patient does have painful callus left subfifth metatarsal head causing pain.  Objective:  Physical Exam: warm, good capillary refill, diminished pedal hair growth nail exam onychomycosis of the toenails, onycholysis, and dystrophic nails DP pulses palpable, PT pulses palpable, and protective sensation absent Pedal skin atrophic.  Hair growth absent. Left Foot:  Pain with palpation of nails due to elongation and dystrophic growth.  Preulcerative callus left subfifth metatarsal head with hyperkeratotic tissue.  Prominent left fifth met head with decreased foot range of motion secondary to history of ankle fusion. Right Foot: Pain with palpation of nails due to elongation and dystrophic growth.   Assessment:   1. Pain due to onychomycosis of nail   2. Diabetic polyneuropathy associated with type 2 diabetes mellitus (HCC)   3. Pre-ulcerative calluses      Plan:  Patient was evaluated and treated and all questions answered.  #Onychomycosis with pain  -Nails palliatively debrided as below. -Educated on self-care  Procedure: Nail Debridement Rationale: Pain Type of Debridement: manual, sharp debridement. Instrumentation: Nail nipper, rotary burr. Number of Nails: 10  # Preulcerative callus left foot subfifth metatarsal head -Symptomatic hyperkeratotic callus x1 were safely debrided with a sterile #15 blade  to patient's level of comfort without incident. We discussed preventative and palliative care of these lesions including supportive and accommodative shoegear, padding, prefabricated and custom molded accommodative orthoses, use of a pumice stone and lotions/creams daily.  -Pedal deformity associated with ankle fusion contributing to the recurrence of the lesion  Return in about 3 months (around 04/29/2024) for Diabetic Foot Care.         Bronwen Betters, DPM [facility] / [facility]

## 2024-04-28 ENCOUNTER — Ambulatory Visit: Admitting: Podiatry

## 2024-05-12 ENCOUNTER — Encounter: Payer: Self-pay | Admitting: Podiatry

## 2024-05-12 ENCOUNTER — Ambulatory Visit: Admitting: Podiatry

## 2024-05-12 DIAGNOSIS — B351 Tinea unguium: Secondary | ICD-10-CM | POA: Diagnosis not present

## 2024-05-12 DIAGNOSIS — Z89422 Acquired absence of other left toe(s): Secondary | ICD-10-CM | POA: Diagnosis not present

## 2024-05-12 DIAGNOSIS — Z981 Arthrodesis status: Secondary | ICD-10-CM

## 2024-05-12 DIAGNOSIS — L84 Corns and callosities: Secondary | ICD-10-CM | POA: Diagnosis not present

## 2024-05-12 DIAGNOSIS — E1142 Type 2 diabetes mellitus with diabetic polyneuropathy: Secondary | ICD-10-CM

## 2024-05-12 DIAGNOSIS — M79609 Pain in unspecified limb: Secondary | ICD-10-CM

## 2024-05-12 NOTE — Progress Notes (Signed)
  Subjective:  Patient ID: Samuel Hodge, male    DOB: 01/24/1951,  MRN: [id number]  Chief Complaint  Patient presents with   [facility]    [facility] with one callous, A1c was 7.7 in May. No anti coag.     73 y.o. male presents with the above complaint. History confirmed with patient. Patient presenting with pain related to dystrophic thickened elongated nails. Patient is unable to trim own nails related to nail dystrophy and/or mobility issues. Patient does  have a history of T2DM.  Last A1c 7.7.  Patient does have callus present located at the left subfourth metatarsal head causing pain.  Does have history of neuropathy, history of prior left ankle fusion and history of left fifth metatarsal head resection and he reports some pain and some balance issues secondary to this.  Objective:  Physical Exam: warm, good capillary refill, pedal skin atrophic, diminished pedal hair growth nail exam onychomycosis of the toenails, onycholysis, and dystrophic nails DP pulses palpable, PT pulses palpable, and protective sensation absent Left Foot:  Pain with palpation of nails due to elongation and dystrophic growth.  Preulcerative callus present left subfourth metatarsal head.  History of left ankle fusion and left fifth metatarsal head resection.  Does have some pain about the left ankle on palpation Right Foot: Pain with palpation of nails due to elongation and dystrophic growth.   Assessment:   1. Pain due to onychomycosis of nail   2. Diabetic polyneuropathy associated with type 2 diabetes mellitus (HCC)   3. Pre-ulcerative calluses   4. Status post ankle fusion   5. History of partial ray amputation of fifth toe of left foot (HCC)      Plan:  Patient was evaluated and treated and all questions answered.  #Pre ulcerative calluses present left subfourth metatarsal head All symptomatic hyperkeratoses x 1 separate lesions were safely debrided with a sterile #312 blade to patient's level of comfort without  incident. We discussed preventative and palliative care of these lesions including supportive and accommodative shoegear, padding, prefabricated and custom molded accommodative orthoses, use of a pumice stone and lotions/creams daily.  #Onychomycosis with pain  -Nails palliatively debrided as below. -Educated on self-care  Procedure: Nail Debridement Rationale: Pain Type of Debridement: manual, sharp debridement. Instrumentation: Nail nipper, rotary burr. Number of Nails: 10  # Diabetes with neuropathy # History of left ankle fusion # History of left fifth metatarsal head resection secondary to osteomyelitis -Patient educated on diabetes. Discussed proper diabetic foot care and discussed risks and complications of disease. Educated patient in depth on reasons to return to the office immediately should he/she discover anything concerning or new on the feet. All questions answered. Discussed proper shoes as well.  - Sending in prescription for diabetic shoes with 3 sets of multidensity diabetic inserts.  Referral to [facility] for this.  Would benefit from this due to his neuropathy, preulcerative callus underneath the fourth metatarsal head, also aggravated by altered gait from left ankle arthrodesis and altered biomechanics from resected fifth metatarsal head left foot due to infection. -I certify that this diagnosis represents a distinct and separate diagnosis that requires evaluation and treatment separate from other procedures or diagnosis    Return in about 3 months (around 08/12/2024) for Diabetic Foot Care.         Ethan Saddler, DPM [facility] / [facility]

## 2024-08-11 ENCOUNTER — Ambulatory Visit: Admitting: Podiatry

## 2024-08-11 DIAGNOSIS — L84 Corns and callosities: Secondary | ICD-10-CM

## 2024-08-11 DIAGNOSIS — E1142 Type 2 diabetes mellitus with diabetic polyneuropathy: Secondary | ICD-10-CM

## 2024-08-11 DIAGNOSIS — B351 Tinea unguium: Secondary | ICD-10-CM | POA: Diagnosis not present

## 2024-08-11 DIAGNOSIS — M79609 Pain in unspecified limb: Secondary | ICD-10-CM

## 2024-08-11 DIAGNOSIS — Z981 Arthrodesis status: Secondary | ICD-10-CM

## 2024-08-11 NOTE — Progress Notes (Unsigned)
 Subjective:  Patient ID: Samuel Hodge, male    DOB: [birth year],  MRN: [id number]  Chief Complaint  Patient presents with   [facility]    [facility] with callous, he did bring his old brace for you to look.  A1c 7.7 in May No anti coag.     73 y.o. male presents with the above complaint. History confirmed with patient. Patient presenting with pain related to dystrophic thickened elongated nails. Patient is unable to trim own nails related to nail dystrophy and/or mobility issues. Patient does  have a history of T2DM.  Last A1c 7.7.  Patient does have callus present located at the left subfourth metatarsal head causing pain.  Does have history of neuropathy, history of prior left ankle fusion and history of left fifth metatarsal head resection and he reports some pain and some balance issues secondary to this.  He is waiting on getting diabetic shoes made.  He does have an old [place]  brace from several years prior from our practice which he has with him today.  He does not routinely use this but would like to know if this may help with the balance issues and wants to know about being able to use it with his new diabetic shoes.  Objective:  Physical Exam: warm, good capillary refill, pedal skin atrophic, diminished pedal hair growth nail exam onychomycosis of the toenails, onycholysis, and dystrophic nails DP pulses palpable, PT pulses palpable, and protective sensation absent Left Foot:  Pain with palpation of nails due to elongation and dystrophic growth.  Preulcerative callus present left subfourth metatarsal head.  History of left ankle fusion and left fifth metatarsal head resection. Right Foot: Pain with palpation of nails due to elongation and dystrophic growth.   Assessment:   1. Pain due to onychomycosis of nail   2. Pre-ulcerative calluses   3. Diabetic polyneuropathy associated with type 2 diabetes mellitus (HCC)   4. History of ankle fusion      Plan:  Patient was evaluated and treated  and all questions answered.  #Pre ulcerative calluses present left subfourth metatarsal head All symptomatic hyperkeratoses x 1 separate lesions were safely debrided with a sterile #312 blade to patient's level of comfort without incident. We discussed preventative and palliative care of these lesions including supportive and accommodative shoegear, padding, prefabricated and custom molded accommodative orthoses, use of a pumice stone and lotions/creams daily. - Prior left fifth ray amputation  #Onychomycosis with pain  -Nails palliatively debrided as below. -Educated on self-care  Procedure: Nail Debridement Rationale: Pain Type of Debridement: manual, sharp debridement. Instrumentation: Nail nipper, rotary burr. Number of Nails: 10  # Diabetes with neuropathy # History of left ankle fusion # History of left fifth metatarsal head resection secondary to osteomyelitis -Patient educated on diabetes. Discussed proper diabetic foot care and discussed risks and complications of disease. Educated patient in depth on reasons to return to the office immediately should he/she discover anything concerning or new on the feet. All questions answered. Discussed proper shoes as well.  - He is awaiting his diabetic shoes from [place].  I did advise that he bring the [place]  brace with him to his fitting appointment to see if it can be used with the diabetic shoes.  Do think that this will help with some of his balance concerns.  Also advised that he try using a walking stick.   Return in about 3 months (around 11/10/2024) for Diabetic Foot Care.         Ethan  Lamount, DPM [facility] / [facility]

## 2024-08-25 ENCOUNTER — Telehealth: Payer: Self-pay | Admitting: Podiatry

## 2024-08-25 NOTE — Telephone Encounter (Signed)
 PA faxed to [facility]

## 2024-11-10 ENCOUNTER — Encounter: Payer: Self-pay | Admitting: Podiatry

## 2024-11-10 ENCOUNTER — Ambulatory Visit: Admitting: Podiatry

## 2024-11-10 DIAGNOSIS — E1142 Type 2 diabetes mellitus with diabetic polyneuropathy: Secondary | ICD-10-CM

## 2024-11-10 DIAGNOSIS — Z89422 Acquired absence of other left toe(s): Secondary | ICD-10-CM

## 2024-11-10 DIAGNOSIS — L84 Corns and callosities: Secondary | ICD-10-CM

## 2024-11-10 DIAGNOSIS — M79674 Pain in right toe(s): Secondary | ICD-10-CM

## 2024-11-10 DIAGNOSIS — M79675 Pain in left toe(s): Secondary | ICD-10-CM | POA: Diagnosis not present

## 2024-11-10 DIAGNOSIS — B351 Tinea unguium: Secondary | ICD-10-CM

## 2024-11-10 NOTE — Progress Notes (Unsigned)
 Nails Left sub 5th callus Possibly wanting pt for gait instability will contact

## 2025-02-08 ENCOUNTER — Ambulatory Visit: Admitting: Podiatry
